# Patient Record
Sex: Female | Born: 1945
Health system: Southern US, Community
[De-identification: ages and names within clinical notes are randomized; demographics above are authoritative.]

## PROBLEM LIST (undated history)

## (undated) DIAGNOSIS — G510 Bell's palsy: Secondary | ICD-10-CM

## (undated) DIAGNOSIS — N3946 Mixed incontinence: Secondary | ICD-10-CM

## (undated) DIAGNOSIS — E049 Nontoxic goiter, unspecified: Secondary | ICD-10-CM

## (undated) DIAGNOSIS — I509 Heart failure, unspecified: Secondary | ICD-10-CM

## (undated) DIAGNOSIS — M109 Gout, unspecified: Secondary | ICD-10-CM

## (undated) DIAGNOSIS — I10 Essential (primary) hypertension: Secondary | ICD-10-CM

## (undated) DIAGNOSIS — G43909 Migraine, unspecified, not intractable, without status migrainosus: Secondary | ICD-10-CM

## (undated) DIAGNOSIS — E041 Nontoxic single thyroid nodule: Secondary | ICD-10-CM

## (undated) DIAGNOSIS — E1142 Type 2 diabetes mellitus with diabetic polyneuropathy: Secondary | ICD-10-CM

## (undated) DIAGNOSIS — M179 Osteoarthritis of knee, unspecified: Secondary | ICD-10-CM

## (undated) DIAGNOSIS — E669 Obesity, unspecified: Secondary | ICD-10-CM

## (undated) DIAGNOSIS — M1990 Unspecified osteoarthritis, unspecified site: Secondary | ICD-10-CM

## (undated) DIAGNOSIS — K222 Esophageal obstruction: Secondary | ICD-10-CM

## (undated) DIAGNOSIS — I5189 Other ill-defined heart diseases: Secondary | ICD-10-CM

## (undated) DIAGNOSIS — D126 Benign neoplasm of colon, unspecified: Secondary | ICD-10-CM

## (undated) DIAGNOSIS — I209 Angina pectoris, unspecified: Secondary | ICD-10-CM

## (undated) DIAGNOSIS — E119 Type 2 diabetes mellitus without complications: Secondary | ICD-10-CM

## (undated) DIAGNOSIS — Z8616 Personal history of COVID-19: Secondary | ICD-10-CM

## (undated) DIAGNOSIS — R1310 Dysphagia, unspecified: Secondary | ICD-10-CM

## (undated) DIAGNOSIS — K219 Gastro-esophageal reflux disease without esophagitis: Secondary | ICD-10-CM

## (undated) DIAGNOSIS — R928 Other abnormal and inconclusive findings on diagnostic imaging of breast: Secondary | ICD-10-CM

## (undated) DIAGNOSIS — E785 Hyperlipidemia, unspecified: Secondary | ICD-10-CM

## (undated) DIAGNOSIS — I2510 Atherosclerotic heart disease of native coronary artery without angina pectoris: Secondary | ICD-10-CM

## (undated) DIAGNOSIS — N8110 Cystocele, unspecified: Secondary | ICD-10-CM

## (undated) DIAGNOSIS — K5792 Diverticulitis of intestine, part unspecified, without perforation or abscess without bleeding: Secondary | ICD-10-CM

## (undated) HISTORY — DX: Benign neoplasm of colon, unspecified: D12.6

## (undated) HISTORY — DX: Migraine, unspecified, not intractable, without status migrainosus: G43.909

## (undated) HISTORY — DX: Heart failure, unspecified: I50.9

## (undated) HISTORY — DX: Diverticulitis of intestine, part unspecified, without perforation or abscess without bleeding: K57.92

## (undated) HISTORY — PX: TUBAL LIGATION: SHX77

## (undated) HISTORY — DX: Dysphagia, unspecified: R13.10

## (undated) HISTORY — DX: Esophageal obstruction: K22.2

## (undated) HISTORY — PX: OTHER SURGICAL HISTORY: SHX169

## (undated) HISTORY — DX: Other ill-defined heart diseases: I51.89

## (undated) HISTORY — DX: Other abnormal and inconclusive findings on diagnostic imaging of breast: R92.8

## (undated) HISTORY — DX: Cystocele, unspecified: N81.10

## (undated) HISTORY — DX: Nontoxic single thyroid nodule: E04.1

## (undated) HISTORY — PX: CARDIAC SURGERY: SHX584

## (undated) HISTORY — DX: Mixed incontinence: N39.46

## (undated) HISTORY — DX: Essential (primary) hypertension: I10

## (undated) HISTORY — DX: Type 2 diabetes mellitus without complications: E11.9

## (undated) HISTORY — DX: Osteoarthritis of knee, unspecified: M17.9

## (undated) HISTORY — DX: Unspecified osteoarthritis, unspecified site: M19.90

## (undated) HISTORY — DX: Bell's palsy: G51.0

## (undated) HISTORY — DX: Type 2 diabetes mellitus with diabetic polyneuropathy: E11.42

## (undated) HISTORY — PX: DIAGNOSTIC LAPAROSCOPY: SUR761

## (undated) HISTORY — DX: Personal history of COVID-19: Z86.16

## (undated) HISTORY — PX: EYE SURGERY: SHX253

## (undated) HISTORY — DX: Gastro-esophageal reflux disease without esophagitis: K21.9

---

## 1998-07-28 ENCOUNTER — Other Ambulatory Visit: Admission: RE | Admit: 1998-07-28 | Discharge: 1998-07-28 | Payer: Self-pay | Admitting: Family Medicine

## 1998-07-29 ENCOUNTER — Other Ambulatory Visit: Admission: RE | Admit: 1998-07-29 | Discharge: 1998-07-29 | Payer: Self-pay | Admitting: Gynecology

## 1998-07-29 ENCOUNTER — Encounter (INDEPENDENT_AMBULATORY_CARE_PROVIDER_SITE_OTHER): Payer: Self-pay

## 1999-04-13 ENCOUNTER — Encounter: Payer: Self-pay | Admitting: Urology

## 1999-04-14 ENCOUNTER — Ambulatory Visit (HOSPITAL_COMMUNITY): Admission: RE | Admit: 1999-04-14 | Discharge: 1999-04-14 | Payer: Self-pay | Admitting: Urology

## 1999-04-14 ENCOUNTER — Encounter (INDEPENDENT_AMBULATORY_CARE_PROVIDER_SITE_OTHER): Payer: Self-pay | Admitting: *Deleted

## 2000-05-23 ENCOUNTER — Other Ambulatory Visit: Admission: RE | Admit: 2000-05-23 | Discharge: 2000-05-23 | Payer: Self-pay | Admitting: Family Medicine

## 2001-03-23 ENCOUNTER — Ambulatory Visit (HOSPITAL_COMMUNITY): Admission: RE | Admit: 2001-03-23 | Discharge: 2001-03-23 | Payer: Self-pay | Admitting: Family Medicine

## 2001-03-23 ENCOUNTER — Encounter: Payer: Self-pay | Admitting: Family Medicine

## 2001-04-04 ENCOUNTER — Ambulatory Visit (HOSPITAL_COMMUNITY): Admission: RE | Admit: 2001-04-04 | Discharge: 2001-04-04 | Payer: Self-pay | Admitting: Family Medicine

## 2001-04-04 ENCOUNTER — Encounter: Payer: Self-pay | Admitting: Family Medicine

## 2001-08-14 ENCOUNTER — Encounter: Admission: RE | Admit: 2001-08-14 | Discharge: 2001-11-12 | Payer: Self-pay | Admitting: Family Medicine

## 2003-06-07 ENCOUNTER — Other Ambulatory Visit: Admission: RE | Admit: 2003-06-07 | Discharge: 2003-06-07 | Payer: Self-pay | Admitting: Family Medicine

## 2003-11-25 ENCOUNTER — Encounter: Admission: RE | Admit: 2003-11-25 | Discharge: 2003-11-25 | Payer: Self-pay | Admitting: Cardiology

## 2003-11-26 ENCOUNTER — Inpatient Hospital Stay (HOSPITAL_BASED_OUTPATIENT_CLINIC_OR_DEPARTMENT_OTHER): Admission: RE | Admit: 2003-11-26 | Discharge: 2003-11-26 | Payer: Self-pay | Admitting: Cardiology

## 2004-07-15 ENCOUNTER — Encounter: Admission: RE | Admit: 2004-07-15 | Discharge: 2004-07-15 | Payer: Self-pay | Admitting: Family Medicine

## 2006-11-30 ENCOUNTER — Other Ambulatory Visit: Admission: RE | Admit: 2006-11-30 | Discharge: 2006-11-30 | Payer: Self-pay | Admitting: Family Medicine

## 2008-05-21 ENCOUNTER — Other Ambulatory Visit: Admission: RE | Admit: 2008-05-21 | Discharge: 2008-05-21 | Payer: Self-pay | Admitting: Family Medicine

## 2010-05-29 NOTE — Cardiovascular Report (Signed)
Shannon Moore, Shannon Moore                ACCOUNT NO.:  [id number]   MEDICAL RECORD NO.:  [id number]          PATIENT TYPE:  OIB   LOCATION:  6501                         FACILITY:  [facility]   PHYSICIAN:  W. Ashley Royalty., M.D.DATE OF BIRTH:  1945/08/20   DATE OF PROCEDURE:  11/26/2003  DATE OF DISCHARGE:                              CARDIAC CATHETERIZATION   BRIEF HISTORY:  This is a 65 year old female with a prior history of  congestive heart failure who presented with increasing dyspnea on exertion  and exercise intolerance.  The study is done to evaluate for coronary artery  disease in view of an abnormal Cardiolite scan.   PERFORMING PHYSICIAN:  Georga Hacking, M.D.   DESCRIPTION OF PROCEDURE:  The patient tolerated the procedure well and  following the procedure had good hemostasis and peripheral pulses noted.   HEMODYNAMIC DATA:  Aorta post contrast - 155/75.  LV post contrast - 155/9  to 14.   ANGIOGRAPHIC DATA:  Left ventriculogram:  Performed in the 30 degree RAO  projection.  The aortic valve was normal.  The mitral valve was normal.  The  left ventricle appears normal in size.  The estimated ejection fraction is  60%. Wall motion is normal.  The coronary arteries arise and distribute  normally.  There is no significant coronary calcification noted.  The left  main coronary artery appears normal.   Left anterior descending coronary artery:  There is a mild 20 to 30%  proximal narrowing noted.  The distal vessel is moderately tortuous.   Circumflex coronary artery:  This is a codominant vessel which is tortuous  containing scattered irregularities, but no significant stenosis is noted.   Right coronary artery:  Dominant vessel with no significant stenoses noted.   IMPRESSION:  1.  Normal left ventricular function.  2.  Very mild coronary artery disease involving the proximal left anterior      descending with otherwise insignificant disease elsewhere.      [name]   WST/MEDQ  D:  11/26/2003  T:  11/26/2003  Job:  [id number]   cc:   Stacie Acres. White, M.D.  [address]  [place]  [place] [address]  Fax: [phone]

## 2010-05-29 NOTE — Op Note (Signed)
. [facility]  Patient:    Shannon Moore, Shannon Moore                         MRN: [id number] Proc. Date: 04/14/99 Adm. Date:  [id number] Attending:  Monica Becton                           Operative Report  PREOPERATIVE DIAGNOSIS:  Superficial papillary bladder tumor.  History of gross  hematuria.  POSTOPERATIVE DIAGNOSIS:  Superficial papillary bladder tumor.  History of gross hematuria.  PROCEDURE:  Cystoscopy, cold cut biopsy of bladder lesion, and transurethral fulguration.  SURGEON:  Claudette Laws, M.D.  DESCRIPTION OF PROCEDURE:  The patient was prepped and draped in the dorsal lithotomy position under intubated general anesthesia.  Cystoscopy was performed revealing a small superficial-appearing papillary lesion just inside the right ureteral orifice up behind the bladder neck area.  The rest of the bladder looked smooth with no other suspicious lesions.  Normal ureteral orifices.  She had some squamous metaplasia of the trigone which we biopsied as biopsy #2.  Initially using the cold cup forceps, we took some biopsies of the papillary lesion. Then we switched to an Olympus continuous flow resectoscope and fulgurated the rest of the bladder tumor with care being taken to preserve the bladder neck area.  The tumor did abut up against the bladder neck at about the 7 oclock position.  All bleeders were electrocoagulated.  5 cc of Urijet was injected intraurethrally.  #18 French Foley catheter was hooked to straight drain and irrigated well.  She was taken back to the recovery room in satisfactory condition. DD:  04/14/99 TD:  04/14/99 Job: [id number]

## 2010-11-11 ENCOUNTER — Other Ambulatory Visit (HOSPITAL_COMMUNITY): Payer: Self-pay | Admitting: Orthopedic Surgery

## 2010-11-11 ENCOUNTER — Ambulatory Visit (HOSPITAL_COMMUNITY)
Admission: RE | Admit: 2010-11-11 | Discharge: 2010-11-11 | Disposition: A | Discharge status: Home / Self Care | Payer: Medicare Other | Source: Ambulatory Visit | Attending: Orthopedic Surgery | Admitting: Orthopedic Surgery

## 2010-11-11 ENCOUNTER — Encounter (HOSPITAL_COMMUNITY): Payer: Self-pay

## 2010-11-11 ENCOUNTER — Encounter (HOSPITAL_COMMUNITY): Payer: Medicare Other

## 2010-11-11 DIAGNOSIS — Z01811 Encounter for preprocedural respiratory examination: Secondary | ICD-10-CM

## 2010-11-11 DIAGNOSIS — Z01812 Encounter for preprocedural laboratory examination: Secondary | ICD-10-CM | POA: Insufficient documentation

## 2010-11-11 DIAGNOSIS — Z01818 Encounter for other preprocedural examination: Secondary | ICD-10-CM | POA: Insufficient documentation

## 2010-11-11 DIAGNOSIS — Z01810 Encounter for preprocedural cardiovascular examination: Secondary | ICD-10-CM | POA: Insufficient documentation

## 2010-11-11 DIAGNOSIS — R9431 Abnormal electrocardiogram [ECG] [EKG]: Secondary | ICD-10-CM | POA: Insufficient documentation

## 2010-11-11 LAB — BASIC METABOLIC PANEL
Calcium: 10.3 mg/dL (ref 8.4–10.5)
GFR calc Af Amer: >90 mL/min (ref >90)
GFR calc non Af Amer: 87 mL/min — ABNORMAL LOW (ref >90)
Glucose, Bld: 127 mg/dL — ABNORMAL HIGH (ref 70–99)
Sodium: 139 mEq/L (ref 135–145)

## 2010-11-11 LAB — CBC
Hemoglobin: 13.4 g/dL (ref 12.0–15.0)
MCH: 29.8 pg (ref 26.0–34.0)
MCHC: 33.5 g/dL (ref 30.0–36.0)
Platelets: 325 10*3/uL (ref 150–400)
RDW: 14 % (ref 11.5–15.5)

## 2010-11-11 LAB — URINALYSIS, ROUTINE W REFLEX MICROSCOPIC
Hgb urine dipstick: NEGATIVE
Protein, ur: NEGATIVE mg/dL
Urobilinogen, UA: 0.2 mg/dL (ref 0.0–1.0)

## 2010-11-11 LAB — DIFFERENTIAL
Basophils Relative: 1 % (ref 0–1)
Eosinophils Absolute: 0.3 10*3/uL (ref 0.0–0.7)
Eosinophils Relative: 3 % (ref 0–5)
Monocytes Absolute: 0.5 10*3/uL (ref 0.1–1.0)
Monocytes Relative: 7 % (ref 3–12)

## 2010-11-11 LAB — SURGICAL PCR SCREEN: Staphylococcus aureus: NEGATIVE

## 2010-11-11 LAB — PROTIME-INR: Prothrombin Time: 13.5 seconds (ref 11.6–15.2)

## 2010-11-11 NOTE — Pre-Procedure Instructions (Signed)
PAT APPT- pre op teaching done, report to chart. Verbalizes understanding. L Deniel Mcquiston RN

## 2010-11-13 MED ORDER — BUPIVACAINE 0.25 % ON-Q PUMP SINGLE CATH 300ML
300.0000 mL | INJECTION | Status: DC
  Filled 2010-11-13: qty 300

## 2010-11-16 ENCOUNTER — Encounter (HOSPITAL_COMMUNITY): Payer: Self-pay | Admitting: Anesthesiology

## 2010-11-16 ENCOUNTER — Inpatient Hospital Stay (HOSPITAL_COMMUNITY): Payer: Medicare Other | Admitting: Anesthesiology

## 2010-11-16 ENCOUNTER — Inpatient Hospital Stay (HOSPITAL_COMMUNITY)
Admission: RE | Admit: 2010-11-16 | Discharge: 2010-11-18 | DRG: 470 | Disposition: A | Discharge status: Home Health | Payer: Medicare Other | Source: Ambulatory Visit | Attending: Orthopedic Surgery | Admitting: Orthopedic Surgery

## 2010-11-16 ENCOUNTER — Encounter (HOSPITAL_COMMUNITY): Payer: Self-pay | Admitting: *Deleted

## 2010-11-16 ENCOUNTER — Encounter (HOSPITAL_COMMUNITY)
Admission: RE | Disposition: A | Discharge status: Home Health | Payer: Self-pay | Source: Ambulatory Visit | Attending: Orthopedic Surgery

## 2010-11-16 DIAGNOSIS — Z01812 Encounter for preprocedural laboratory examination: Secondary | ICD-10-CM

## 2010-11-16 DIAGNOSIS — Z96659 Presence of unspecified artificial knee joint: Secondary | ICD-10-CM

## 2010-11-16 DIAGNOSIS — I10 Essential (primary) hypertension: Secondary | ICD-10-CM | POA: Diagnosis present

## 2010-11-16 DIAGNOSIS — E785 Hyperlipidemia, unspecified: Secondary | ICD-10-CM | POA: Diagnosis present

## 2010-11-16 DIAGNOSIS — Z01810 Encounter for preprocedural cardiovascular examination: Secondary | ICD-10-CM

## 2010-11-16 DIAGNOSIS — E119 Type 2 diabetes mellitus without complications: Secondary | ICD-10-CM | POA: Diagnosis present

## 2010-11-16 DIAGNOSIS — Z880 Allergy status to penicillin: Secondary | ICD-10-CM

## 2010-11-16 DIAGNOSIS — R52 Pain, unspecified: Secondary | ICD-10-CM

## 2010-11-16 DIAGNOSIS — Z79899 Other long term (current) drug therapy: Secondary | ICD-10-CM

## 2010-11-16 DIAGNOSIS — M109 Gout, unspecified: Secondary | ICD-10-CM | POA: Diagnosis present

## 2010-11-16 DIAGNOSIS — M1710 Unilateral primary osteoarthritis, unspecified knee: Principal | ICD-10-CM | POA: Diagnosis present

## 2010-11-16 HISTORY — PX: TOTAL KNEE ARTHROPLASTY: SHX125

## 2010-11-16 LAB — TYPE AND SCREEN: Antibody Screen: NEGATIVE

## 2010-11-16 LAB — GLUCOSE, CAPILLARY
Glucose-Capillary: 155 mg/dL — ABNORMAL HIGH (ref 70–99)
Glucose-Capillary: 165 mg/dL — ABNORMAL HIGH (ref 70–99)
Glucose-Capillary: 183 mg/dL — ABNORMAL HIGH (ref 70–99)
Glucose-Capillary: 253 mg/dL — ABNORMAL HIGH (ref 70–99)

## 2010-11-16 SURGERY — ARTHROPLASTY, KNEE, TOTAL
Anesthesia: Spinal | Site: Knee | Laterality: Left | Wound class: Clean

## 2010-11-16 MED ORDER — POLYETHYLENE GLYCOL 3350 17 G PO PACK
17.0000 g | PACK | Freq: Every day | ORAL | Status: DC
  Administered 2010-11-16: 17 g via ORAL
  Filled 2010-11-16 (×3): qty 1

## 2010-11-16 MED ORDER — SENNOSIDES-DOCUSATE SODIUM 8.6-50 MG PO TABS
1.0000 | ORAL_TABLET | Freq: Every evening | ORAL | Status: DC | PRN
  Administered 2010-11-18 (×2): 1 via ORAL
  Filled 2010-11-16: qty 1

## 2010-11-16 MED ORDER — LACTATED RINGERS IV SOLN
INTRAVENOUS | Status: DC | PRN
  Administered 2010-11-16 (×3): via INTRAVENOUS

## 2010-11-16 MED ORDER — DEXAMETHASONE SODIUM PHOSPHATE 4 MG/ML IJ SOLN
INTRAMUSCULAR | Status: DC | PRN
  Administered 2010-11-16: 10 mg via INTRAVENOUS

## 2010-11-16 MED ORDER — DIPHENHYDRAMINE HCL 25 MG PO CAPS: 25.0000 mg | ORAL_CAPSULE | Freq: Four times a day (QID) | ORAL | Status: DC | PRN

## 2010-11-16 MED ORDER — NON FORMULARY: 80.0000 mg | Freq: Every day | Status: DC

## 2010-11-16 MED ORDER — ALUMINUM HYDROXIDE GEL 600 MG/5ML PO SUSP: 15.0000 mL | ORAL | Status: DC | PRN

## 2010-11-16 MED ORDER — INSULIN ASPART 100 UNIT/ML ~~LOC~~ SOLN
0.0000 [IU] | Freq: Three times a day (TID) | SUBCUTANEOUS | Status: DC
  Filled 2010-11-16: qty 3

## 2010-11-16 MED ORDER — KETOROLAC TROMETHAMINE 30 MG/ML IJ SOLN
INTRAMUSCULAR | Status: AC
  Filled 2010-11-16: qty 1

## 2010-11-16 MED ORDER — FLUTICASONE PROPIONATE 50 MCG/ACT NA SUSP
2.0000 | Freq: Two times a day (BID) | NASAL | Status: DC
  Administered 2010-11-17 (×2): 2 via NASAL
  Filled 2010-11-16: qty 16

## 2010-11-16 MED ORDER — DOCUSATE SODIUM 100 MG PO CAPS
100.0000 mg | ORAL_CAPSULE | Freq: Two times a day (BID) | ORAL | Status: DC
  Administered 2010-11-16: 100 mg via ORAL
  Filled 2010-11-16 (×5): qty 1

## 2010-11-16 MED ORDER — HYDROCHLOROTHIAZIDE 12.5 MG PO CAPS
12.5000 mg | ORAL_CAPSULE | Freq: Every day | ORAL | Status: DC
  Administered 2010-11-16 – 2010-11-17 (×2): 12.5 mg via ORAL
  Filled 2010-11-16 (×3): qty 1

## 2010-11-16 MED ORDER — SIMVASTATIN 40 MG PO TABS
40.0000 mg | ORAL_TABLET | Freq: Every day | ORAL | Status: DC
  Administered 2010-11-16 – 2010-11-17 (×2): 40 mg via ORAL
  Filled 2010-11-16 (×3): qty 1

## 2010-11-16 MED ORDER — PROPOFOL 10 MG/ML IV EMUL
INTRAVENOUS | Status: DC | PRN
  Administered 2010-11-16: 160 ug/kg/min via INTRAVENOUS

## 2010-11-16 MED ORDER — PROMETHAZINE HCL 25 MG/ML IJ SOLN: 6.2500 mg | INTRAMUSCULAR | Status: DC | PRN

## 2010-11-16 MED ORDER — INSULIN ASPART 100 UNIT/ML ~~LOC~~ SOLN
0.0000 [IU] | Freq: Three times a day (TID) | SUBCUTANEOUS | Status: DC
  Administered 2010-11-16: 3 [IU] via SUBCUTANEOUS
  Administered 2010-11-17: 2 [IU] via SUBCUTANEOUS
  Administered 2010-11-17: 3 [IU] via SUBCUTANEOUS
  Filled 2010-11-16: qty 3

## 2010-11-16 MED ORDER — FENTANYL CITRATE 0.05 MG/ML IJ SOLN
INTRAMUSCULAR | Status: DC | PRN
  Administered 2010-11-16 (×2): 50 ug via INTRAVENOUS

## 2010-11-16 MED ORDER — METFORMIN HCL ER 500 MG PO TB24
500.0000 mg | ORAL_TABLET | Freq: Every day | ORAL | Status: DC
Start: 2010-11-16 — End: 2010-11-18
  Administered 2010-11-17: 500 mg via ORAL
  Filled 2010-11-16 (×3): qty 1

## 2010-11-16 MED ORDER — PHENOL 1.4 % MT LIQD
1.0000 | OROMUCOSAL | Status: DC | PRN
  Filled 2010-11-16: qty 177

## 2010-11-16 MED ORDER — FERROUS SULFATE 325 (65 FE) MG PO TABS
325.0000 mg | ORAL_TABLET | Freq: Three times a day (TID) | ORAL | Status: DC
  Administered 2010-11-16 – 2010-11-18 (×5): 325 mg via ORAL
  Filled 2010-11-16 (×8): qty 1

## 2010-11-16 MED ORDER — HYDROCHLOROTHIAZIDE 25 MG PO TABS
12.5000 mg | ORAL_TABLET | Freq: Every day | ORAL | Status: DC
  Filled 2010-11-16: qty 0.5

## 2010-11-16 MED ORDER — METHOCARBAMOL 100 MG/ML IJ SOLN
500.0000 mg | Freq: Four times a day (QID) | INTRAMUSCULAR | Status: DC | PRN
  Administered 2010-11-16: 500 mg via INTRAVENOUS
  Filled 2010-11-16 (×2): qty 5

## 2010-11-16 MED ORDER — LORATADINE 10 MG PO TABS
10.0000 mg | ORAL_TABLET | Freq: Every day | ORAL | Status: DC
  Administered 2010-11-17: 10 mg via ORAL
  Filled 2010-11-16 (×2): qty 1

## 2010-11-16 MED ORDER — METOCLOPRAMIDE HCL 10 MG PO TABS: 5.0000 mg | ORAL_TABLET | Freq: Three times a day (TID) | ORAL | Status: DC | PRN

## 2010-11-16 MED ORDER — POTASSIUM CHLORIDE IN NACL 20-0.9 MEQ/L-% IV SOLN
100.0000 mL/h | INTRAVENOUS | Status: DC
  Administered 2010-11-16 – 2010-11-17 (×2): 100 mL/h via INTRAVENOUS
  Filled 2010-11-16 (×5): qty 1000

## 2010-11-16 MED ORDER — KETOROLAC TROMETHAMINE 30 MG/ML IJ SOLN
INTRAMUSCULAR | Status: DC | PRN
  Administered 2010-11-16: 30 mg via INTRAVENOUS

## 2010-11-16 MED ORDER — BUPIVACAINE IN DEXTROSE 0.75-8.25 % IT SOLN
INTRATHECAL | Status: DC | PRN
  Administered 2010-11-16: 1.2 mL via INTRATHECAL

## 2010-11-16 MED ORDER — OLMESARTAN MEDOXOMIL 20 MG PO TABS
20.0000 mg | ORAL_TABLET | Freq: Every day | ORAL | Status: DC
  Administered 2010-11-16 – 2010-11-17 (×2): 20 mg via ORAL
  Filled 2010-11-16 (×3): qty 1

## 2010-11-16 MED ORDER — MIDAZOLAM HCL 5 MG/5ML IJ SOLN
INTRAMUSCULAR | Status: DC | PRN
  Administered 2010-11-16: 2 mg via INTRAVENOUS

## 2010-11-16 MED ORDER — ALUM & MAG HYDROXIDE-SIMETH 200-200-20 MG/5ML PO SUSP: 30.0000 mL | ORAL | Status: DC | PRN

## 2010-11-16 MED ORDER — ALLOPURINOL 300 MG PO TABS
300.0000 mg | ORAL_TABLET | Freq: Every day | ORAL | Status: DC
Start: 2010-11-16 — End: 2010-11-18
  Administered 2010-11-16 – 2010-11-17 (×2): 300 mg via ORAL
  Filled 2010-11-16 (×3): qty 1

## 2010-11-16 MED ORDER — CLINDAMYCIN PHOSPHATE 600 MG/50ML IV SOLN
600.0000 mg | INTRAVENOUS | Status: AC
  Administered 2010-11-16: 600 mg via INTRAVENOUS
  Filled 2010-11-16: qty 50

## 2010-11-16 MED ORDER — ZOLPIDEM TARTRATE 5 MG PO TABS: 5.0000 mg | ORAL_TABLET | Freq: Every evening | ORAL | Status: DC | PRN

## 2010-11-16 MED ORDER — ACETAMINOPHEN 325 MG PO TABS
650.0000 mg | ORAL_TABLET | Freq: Four times a day (QID) | ORAL | Status: DC | PRN
  Administered 2010-11-16: 650 mg via ORAL

## 2010-11-16 MED ORDER — HYDROMORPHONE HCL PF 1 MG/ML IJ SOLN: 0.2500 mg | INTRAMUSCULAR | Status: DC | PRN

## 2010-11-16 MED ORDER — HYDROMORPHONE HCL PF 1 MG/ML IJ SOLN
0.5000 mg | INTRAMUSCULAR | Status: DC | PRN
  Administered 2010-11-16 – 2010-11-17 (×3): 1 mg via INTRAVENOUS
  Filled 2010-11-16 (×3): qty 1

## 2010-11-16 MED ORDER — SENNA 8.6 MG PO TABS
1.0000 | ORAL_TABLET | Freq: Two times a day (BID) | ORAL | Status: DC
  Administered 2010-11-16 – 2010-11-18 (×3): 8.6 mg via ORAL
  Filled 2010-11-16 (×5): qty 1

## 2010-11-16 MED ORDER — ACETAMINOPHEN 325 MG PO TABS
ORAL_TABLET | ORAL | Status: AC
  Administered 2010-11-16: 650 mg via ORAL
  Filled 2010-11-16: qty 2

## 2010-11-16 MED ORDER — FLEET ENEMA 7-19 GM/118ML RE ENEM: 1.0000 | ENEMA | Freq: Every day | RECTAL | Status: DC | PRN

## 2010-11-16 MED ORDER — MENTHOL 3 MG MT LOZG
1.0000 | LOZENGE | OROMUCOSAL | Status: DC | PRN
  Filled 2010-11-16: qty 9

## 2010-11-16 MED ORDER — SODIUM CHLORIDE 0.9 % IV SOLN
1190.0000 mg | Freq: Once | INTRAVENOUS | Status: DC
  Filled 2010-11-16: qty 11.9

## 2010-11-16 MED ORDER — RIVAROXABAN 10 MG PO TABS
10.0000 mg | ORAL_TABLET | ORAL | Status: DC
  Administered 2010-11-17: 10 mg via ORAL
  Filled 2010-11-16 (×2): qty 1

## 2010-11-16 MED ORDER — ONDANSETRON HCL 4 MG/2ML IJ SOLN
4.0000 mg | Freq: Four times a day (QID) | INTRAMUSCULAR | Status: DC | PRN
  Administered 2010-11-18: 4 mg via INTRAVENOUS
  Filled 2010-11-16: qty 2

## 2010-11-16 MED ORDER — BISACODYL 5 MG PO TBEC: 10.0000 mg | DELAYED_RELEASE_TABLET | Freq: Every day | ORAL | Status: DC | PRN

## 2010-11-16 MED ORDER — TRANEXAMIC ACID 100 MG/ML IV SOLN
1190.0000 mg | INTRAVENOUS | Status: DC | PRN
  Administered 2010-11-16: 1190 mg via INTRAVENOUS

## 2010-11-16 MED ORDER — METHOCARBAMOL 500 MG PO TABS
500.0000 mg | ORAL_TABLET | Freq: Four times a day (QID) | ORAL | Status: DC | PRN
  Administered 2010-11-17 – 2010-11-18 (×4): 500 mg via ORAL
  Filled 2010-11-16 (×4): qty 1

## 2010-11-16 MED ORDER — PROPOFOL 10 MG/ML IV EMUL
INTRAVENOUS | Status: DC | PRN
  Administered 2010-11-16: 20 mg via INTRAVENOUS

## 2010-11-16 MED ORDER — CLINDAMYCIN PHOSPHATE 600 MG/50ML IV SOLN
600.0000 mg | Freq: Four times a day (QID) | INTRAVENOUS | Status: AC
  Administered 2010-11-16 – 2010-11-17 (×3): 600 mg via INTRAVENOUS
  Filled 2010-11-16 (×3): qty 50

## 2010-11-16 MED ORDER — BISACODYL 10 MG RE SUPP: 10.0000 mg | Freq: Every day | RECTAL | Status: DC | PRN

## 2010-11-16 MED ORDER — METOCLOPRAMIDE HCL 5 MG/ML IJ SOLN: 5.0000 mg | Freq: Three times a day (TID) | INTRAMUSCULAR | Status: DC | PRN

## 2010-11-16 MED ORDER — HYDROCODONE-ACETAMINOPHEN 7.5-325 MG PO TABS
1.0000 | ORAL_TABLET | ORAL | Status: DC | PRN
  Administered 2010-11-16 – 2010-11-18 (×7): 2 via ORAL
  Filled 2010-11-16 (×7): qty 2

## 2010-11-16 MED ORDER — BUPIVACAINE-EPINEPHRINE PF 0.25-1:200000 % IJ SOLN
INTRAMUSCULAR | Status: AC
  Filled 2010-11-16: qty 60

## 2010-11-16 MED ORDER — BUPIVACAINE-EPINEPHRINE PF 0.25-1:200000 % IJ SOLN
INTRAMUSCULAR | Status: DC | PRN
  Administered 2010-11-16: 60 mL

## 2010-11-16 MED ORDER — ONDANSETRON HCL 4 MG PO TABS: 4.0000 mg | ORAL_TABLET | Freq: Four times a day (QID) | ORAL | Status: DC | PRN

## 2010-11-16 MED ORDER — ACETAMINOPHEN 650 MG RE SUPP: 650.0000 mg | Freq: Four times a day (QID) | RECTAL | Status: DC | PRN

## 2010-11-16 SURGICAL SUPPLY — 64 items
ADH SKN CLS APL DERMABOND .7 (GAUZE/BANDAGES/DRESSINGS) ×1
BAG SPEC THK2 15X12 ZIP CLS (MISCELLANEOUS) ×1
BAG ZIPLOCK 12X15 (MISCELLANEOUS) ×2 IMPLANT
BANDAGE ELASTIC 6 VELCRO ST LF (GAUZE/BANDAGES/DRESSINGS) ×2 IMPLANT
BANDAGE ESMARK 6X9 LF (GAUZE/BANDAGES/DRESSINGS) ×1 IMPLANT
BLADE SAW SGTL 13.0X1.19X90.0M (BLADE) ×2 IMPLANT
BNDG CMPR 9X6 STRL LF SNTH (GAUZE/BANDAGES/DRESSINGS) ×1
BNDG ESMARK 6X9 LF (GAUZE/BANDAGES/DRESSINGS) ×2
BOWL SMART MIX CTS (DISPOSABLE) ×2 IMPLANT
CEMENT BONE DEPUY (Cement) ×2 IMPLANT
CEMENT TIBIA MBT SIZE 2 (Knees) IMPLANT
CLOTH BEACON ORANGE TIMEOUT ST (SAFETY) ×2 IMPLANT
COMP FEM CEM PS LT SZ 2 (Knees) ×2 IMPLANT
COMPONENT FEM CEM PS LT SZ 2 (Knees) IMPLANT
CUFF TOURN SGL QUICK 34 (TOURNIQUET CUFF) ×2
CUFF TRNQT CYL 34X4X40X1 (TOURNIQUET CUFF) ×1 IMPLANT
DECANTER SPIKE VIAL GLASS SM (MISCELLANEOUS) ×2 IMPLANT
DERMABOND ADVANCED (GAUZE/BANDAGES/DRESSINGS) ×1
DERMABOND ADVANCED .7 DNX12 (GAUZE/BANDAGES/DRESSINGS) ×1 IMPLANT
DRAPE EXTREMITY T 121X128X90 (DRAPE) ×2 IMPLANT
DRAPE POUCH INSTRU U-SHP 10X18 (DRAPES) ×2 IMPLANT
DRAPE U-SHAPE 47X51 STRL (DRAPES) ×2 IMPLANT
DRSG AQUACEL AG ADV 3.5X10 (GAUZE/BANDAGES/DRESSINGS) ×2 IMPLANT
DRSG TEGADERM 4X4.75 (GAUZE/BANDAGES/DRESSINGS) ×2 IMPLANT
DURAPREP 26ML APPLICATOR (WOUND CARE) ×2 IMPLANT
ELECT REM PT RETURN 9FT ADLT (ELECTROSURGICAL) ×2
ELECTRODE REM PT RTRN 9FT ADLT (ELECTROSURGICAL) ×1 IMPLANT
EVACUATOR 1/8 PVC DRAIN (DRAIN) ×2 IMPLANT
FACESHIELD LNG OPTICON STERILE (SAFETY) ×10 IMPLANT
GAUZE SPONGE 2X2 8PLY STRL LF (GAUZE/BANDAGES/DRESSINGS) ×1 IMPLANT
GLOVE BIOGEL PI IND STRL 7.5 (GLOVE) ×1 IMPLANT
GLOVE BIOGEL PI IND STRL 8 (GLOVE) ×1 IMPLANT
GLOVE BIOGEL PI IND STRL 8.5 (GLOVE) IMPLANT
GLOVE BIOGEL PI INDICATOR 7.5 (GLOVE) ×1
GLOVE BIOGEL PI INDICATOR 8 (GLOVE) ×1
GLOVE BIOGEL PI INDICATOR 8.5 (GLOVE)
GLOVE ECLIPSE 8.0 STRL XLNG CF (GLOVE) ×2 IMPLANT
GLOVE ORTHO TXT STRL SZ7.5 (GLOVE) ×4 IMPLANT
GOWN STRL NON-REIN LRG LVL3 (GOWN DISPOSABLE) ×2 IMPLANT
HANDPIECE INTERPULSE COAX TIP (DISPOSABLE) ×2
IMMOBILIZER KNEE 20 (SOFTGOODS)
IMMOBILIZER KNEE 20 THIGH 36 (SOFTGOODS) IMPLANT
INSERT PFC SIG STB SZ 2 12.5MM (Knees) ×1 IMPLANT
KIT BASIN OR (CUSTOM PROCEDURE TRAY) ×2 IMPLANT
MANIFOLD NEPTUNE II (INSTRUMENTS) ×2 IMPLANT
NDL SAFETY ECLIPSE 18X1.5 (NEEDLE) ×1 IMPLANT
NEEDLE HYPO 18GX1.5 SHARP (NEEDLE) ×2
NS IRRIG 1000ML POUR BTL (IV SOLUTION) ×4 IMPLANT
PACK TOTAL JOINT (CUSTOM PROCEDURE TRAY) ×2 IMPLANT
PATELLA DOME PFC 35MM (Knees) ×1 IMPLANT
POSITIONER SURGICAL ARM (MISCELLANEOUS) ×2 IMPLANT
SET HNDPC FAN SPRY TIP SCT (DISPOSABLE) ×1 IMPLANT
SET PAD KNEE POSITIONER (MISCELLANEOUS) ×2 IMPLANT
SPONGE GAUZE 2X2 STER 10/PKG (GAUZE/BANDAGES/DRESSINGS) ×1
SUCTION FRAZIER 12FR DISP (SUCTIONS) ×2 IMPLANT
SUT MNCRL AB 4-0 PS2 18 (SUTURE) ×2 IMPLANT
SUT VIC AB 1 CT1 36 (SUTURE) ×6 IMPLANT
SUT VIC AB 2-0 CT1 27 (SUTURE) ×6
SUT VIC AB 2-0 CT1 TAPERPNT 27 (SUTURE) ×3 IMPLANT
SYR 50ML LL SCALE MARK (SYRINGE) ×2 IMPLANT
TIBIA MBT CEMENT SIZE 2 (Knees) ×2 IMPLANT
TOWEL OR 17X26 10 PK STRL BLUE (TOWEL DISPOSABLE) ×4 IMPLANT
TRAY FOLEY CATH 14FRSI W/METER (CATHETERS) ×2 IMPLANT
WATER STERILE IRR 1500ML POUR (IV SOLUTION) ×2 IMPLANT

## 2010-11-16 NOTE — Transfer of Care (Signed)
Immediate Anesthesia Transfer of Care Note  Patient: Shannon Moore  Procedure(s) Performed:  TOTAL KNEE ARTHROPLASTY  Patient Location: PACU  Anesthesia Type: Spinal  Level of Consciousness: awake and alert   Airway & Oxygen Therapy: Patient Spontanous Breathing and Patient connected to face mask oxygen  Post-op Assessment: Report given to PACU RN and Post -op Vital signs reviewed and stable  Post vital signs: Reviewed and stable  Complications: No apparent anesthesia complications

## 2010-11-16 NOTE — Preoperative (Addendum)
Beta Blockers   Reason not to administer Beta Blockers:Not Applicable 

## 2010-11-16 NOTE — Anesthesia Preprocedure Evaluation (Addendum)
Anesthesia Evaluation  Patient identified by MRN, date of birth, ID band Patient awake    Reviewed: Allergy & Precautions, H&P , NPO status , Patient's Chart, lab work & pertinent test results, reviewed documented beta blocker date and time   Airway Mallampati: II TM Distance: >3 FB Neck ROM: Full    Dental  (+) Dental Advisory Given   Pulmonary neg pulmonary ROS,  clear to auscultation  Pulmonary exam normal       Cardiovascular hypertension, +CHF Regular Normal    Neuro/Psych Negative Neurological ROS  Negative Psych ROS   GI/Hepatic negative GI ROS, Neg liver ROS,   Endo/Other  Diabetes mellitus-, Well Controlled, Type 2GOUT   Renal/GU negative Renal ROS  Genitourinary negative   Musculoskeletal negative musculoskeletal ROS (+)   Abdominal   Peds negative pediatric ROS (+)  Hematology negative hematology ROS (+)   Anesthesia Other Findings   Reproductive/Obstetrics negative OB ROS                           Anesthesia Physical Anesthesia Plan  ASA: III  Anesthesia Plan: Spinal   Post-op Pain Management:    Induction:   Airway Management Planned: Mask  Additional Equipment:   Intra-op Plan:   Post-operative Plan: Extubation in OR  Informed Consent: I have reviewed the patients History and Physical, chart, labs and discussed the procedure including the risks, benefits and alternatives for the proposed anesthesia with the patient or authorized representative who has indicated his/her understanding and acceptance.   Dental advisory given  Plan Discussed with: CRNA and Surgeon  Anesthesia Plan Comments:         Anesthesia Quick Evaluation

## 2010-11-16 NOTE — Anesthesia Postprocedure Evaluation (Signed)
  Anesthesia Post-op Note  Patient: Shannon Moore  Procedure(s) Performed:  TOTAL KNEE ARTHROPLASTY  Patient Location: PACU  Anesthesia Type: Spinal  Level of Consciousness: alert  and oriented  Airway and Oxygen Therapy: Patient Spontanous Breathing and Patient connected to nasal cannula oxygen  Post-op Pain: none  Post-op Assessment: Post-op Vital signs reviewed, Patient's Cardiovascular Status Stable, Respiratory Function Stable, Patent Airway and Pain level controlled  Post-op Vital Signs: stable  Complications: No apparent anesthesia complications

## 2010-11-16 NOTE — Progress Notes (Signed)
Pt arrived to unit from PACU on bed, A&Ox3. Assessment as charted. Pt and family oriented to callbell and environment. POC discussed. Will medicate for pain and H/A  per admission orders.

## 2010-11-16 NOTE — Progress Notes (Addendum)
NAME:  Shannon Moore, Shannon Moore                     ACCOUNT NO.:      MEDICAL RECORD NO.:      LOCATION:                                 FACILITY:      PHYSICIAN:  Madlyn Frankel. Charlann Boxer, M.D.  DATE OF BIRTH:  [birth year]      DATE OF ADMISSION:   DATE OF DISCHARGE:                                 HISTORY & PHYSICAL         DATE OF SURGERY:  November 16, 2010.      ADMITTING DIAGNOSIS:  Osteoarthritis, left knee.      HISTORY OF PRESENT ILLNESS:  This is a 65 year old lady with a history   of osteoarthritis of the left knee with failure of conservative   treatment to alleviate her symptoms.  After discussion of treatment,   benefits, risks, and options, the patient is now scheduled for total   knee arthroplasty of the left knee.  Her medical doctor is Dr. Laurann Montana.  She will be going home after surgery.  She is a candidate for   tranexamic acid and will receive that at surgery.  She is given her home   medicines of iron, Robaxin, aspirin, MiraLax, and Colace.      PAST MEDICAL HISTORY:  Drug allergies to PENICILLIN with a rash.  SULFA   with itching.  ZOLOFT with heart failure.      CURRENT MEDICATIONS:   1. Metformin 500 mg two every dinner.   2. Pravastatin 80 mg 1 daily.   3. Benicar/hydrochlorothiazide 1 daily.   4. Allopurinol 300 mg 1 daily.   5. Zyrtec 10 mg 1 daily.      PREVIOUS SURGERIES:  Include a bladder tumor, tubal ligation, right knee   scope, and left knee scoped.      SERIOUS MEDICAL ILLNESSES:  Include hypertension, diabetes,   hyperlipidemia, gout, and allergies.      FAMILY HISTORY:  Positive for diabetes and arthritis.      SOCIAL HISTORY:  The patient is married.  She does not smoke and drinks   1 glass of wine per day.      REVIEW OF SYSTEMS:  CENTRAL NERVOUS SYSTEM:  Negative for headache,   blurred vision, or dizziness.  PULMONARY:  Negative for shortness of   breath, except exertional shortness of breath.  CARDIOVASCULAR:   Positive for history of  heart failure in 1998.  Cardiac cath since then   x2 without any pertinent findings.  No chest pain or regular heart beat.   GI:  Negative for ulcers, hepatitis.  GU:  Negative for urinary tract   difficulty.  MUSCULOSKELETAL:  Positive in HPI.      PHYSICAL EXAMINATION:  VITAL SIGNS:  BP 134/72, respirations 16, pulse   68 and regular.  GENERAL APPEARANCE:  This is a well-developed, well-   nourished lady, in no acute distress.   HEENT:  Head, normocephalic.  Nose patent.  Ears patent.  Pupils equal,   round, and reactive to light.  Throat without injection.   NECK:  Supple without adenopathy.  Carotids 2+ without  bruit.  CHEST:   Clear to auscultation.  No rales, rhonchi.  Respirations 16.  HEART:   Regular rate and rhythm at 68 beats per minute without murmur.   ABDOMEN:  Soft.  Active bowel sounds.  No masses, organomegaly.   NEUROLOGIC:  The patient is alert and orient to time, place, and person.   Cranial nerves 2-12 grossly intact.   EXTREMITIES:  Shows left knee with 5 degrees flexion contracture,   further flexion to 120.  Neurovascular status intact.      IMPRESSION:  Osteoarthritis, left knee.      PLAN:  Total knee arthroplasty, left knee.   Gerrit Halls   [facility]  Patient was seen and re-examined in the holding area.  There is no change in the patient's History and Physical.  Madlyn Frankel. Charlann Boxer, MD  11/16/2010,11:59 AM

## 2010-11-16 NOTE — Progress Notes (Signed)
Utilization review completed.  

## 2010-11-16 NOTE — Anesthesia Procedure Notes (Signed)
Spinal Block  Patient location during procedure: OR Start time: 11/16/2010 12:05 PM End time: 11/16/2010 12:11 PM Staffing Anesthesiologist: Lestine Box B Performed by: anesthesiologist  Preanesthetic Checklist Completed: patient identified, site marked, surgical consent, pre-op evaluation, timeout performed, IV checked, risks and benefits discussed and monitors and equipment checked Spinal Block Patient position: sitting Prep: Betadine Patient monitoring: heart rate, cardiac monitor, continuous pulse ox and blood pressure Approach: right paramedian Location: L3-4 Injection technique: single-shot Needle Needle type: Quincke  Needle gauge: 25 G Needle length: 9 cm Assessment Sensory level: T6 Additional Notes Kit [id number] , 2013-11

## 2010-11-16 NOTE — Transfer of Care (Signed)
Immediate Anesthesia Transfer of Care Note  Patient: Shannon Moore  Procedure(s) Performed:  TOTAL KNEE ARTHROPLASTY  Patient Location: PACU  Anesthesia Type: Spinal  Level of Consciousness: alert   Airway & Oxygen Therapy: Patient Spontanous Breathing  Post-op Assessment: Report given to PACU RN  Post vital signs: Reviewed and stable  Complications: No apparent anesthesia complications

## 2010-11-17 LAB — CBC
Hemoglobin: 11.2 g/dL — ABNORMAL LOW (ref 12.0–15.0)
MCV: 88.7 fL (ref 78.0–100.0)
Platelets: 282 10*3/uL (ref 150–400)
RBC: 3.73 MIL/uL — ABNORMAL LOW (ref 3.87–5.11)
WBC: 11.2 10*3/uL — ABNORMAL HIGH (ref 4.0–10.5)

## 2010-11-17 LAB — BASIC METABOLIC PANEL
CO2: 28 mEq/L (ref 19–32)
Chloride: 98 mEq/L (ref 96–112)
Glucose, Bld: 200 mg/dL — ABNORMAL HIGH (ref 70–99)
Sodium: 136 mEq/L (ref 135–145)

## 2010-11-17 LAB — GLUCOSE, CAPILLARY

## 2010-11-17 MED ORDER — COLCHICINE 0.6 MG PO TABS
0.6000 mg | ORAL_TABLET | Freq: Every day | ORAL | Status: DC
  Filled 2010-11-17 (×2): qty 1

## 2010-11-17 MED ORDER — COLCHICINE 0.6 MG PO TABS
0.6000 mg | ORAL_TABLET | ORAL | Status: AC
  Administered 2010-11-17 (×3): 0.6 mg via ORAL
  Filled 2010-11-17 (×3): qty 1

## 2010-11-17 MED ORDER — COLCHICINE 0.6 MG PO TABS
0.6000 mg | ORAL_TABLET | Freq: Every day | ORAL | Status: DC
  Filled 2010-11-17: qty 1

## 2010-11-17 MED ORDER — COLCHICINE 0.6 MG PO TABS
0.6000 mg | ORAL_TABLET | ORAL | Status: DC
  Filled 2010-11-17 (×3): qty 1

## 2010-11-17 MED ORDER — HYDROMORPHONE HCL PF 2 MG/ML IJ SOLN
0.5000 mg | INTRAMUSCULAR | Status: DC | PRN
  Administered 2010-11-17: 1 mg via INTRAVENOUS
  Administered 2010-11-18: 2 mg via INTRAVENOUS
  Filled 2010-11-17 (×2): qty 1

## 2010-11-17 MED ORDER — SODIUM CHLORIDE 0.9 % IJ SOLN
3.0000 mL | Freq: Two times a day (BID) | INTRAMUSCULAR | Status: DC
  Administered 2010-11-17: 3 mL via INTRAVENOUS

## 2010-11-17 NOTE — Progress Notes (Signed)
  Subjective: 1 Day Post-Op Procedure(s) (LRB): TOTAL KNEE ARTHROPLASTY (Left)  Patient reports pain as mild.  Objective:   VITALS:   Filed Vitals:   11/17/10 0619  BP: 110/60  Pulse: 65  Temp: 97.9 F (36.6 C)  Resp: 16    Neurovascular intact Sensation intact distally Intact pulses distally Dorsiflexion/Plantar flexion intact Incision: dressing C/D/I No cellulitis present Compartment soft  LABS  Basename 11/17/10 0356  HGB 11.2*  HCT 33.1*  WBC 11.2*  PLT 282     Basename 11/17/10 0356  NA 136  K 4.0  BUN 23  CREATININE 0.76  GLUCOSE 200*    No results found for this basename: LABPT:2,INR:2 in the last 72 hours   Assessment/Plan: 1 Day Post-Op Procedure(s) (LRB): TOTAL KNEE ARTHROPLASTY (Left)   Advance diet Up with therapy D/C IV fluids Plan for discharge tomorrow Discharge home with home health   Anastasio Auerbach. Mackena Plummer   PAC  11/17/2010, 9:43 AM

## 2010-11-17 NOTE — Progress Notes (Deleted)
Pt requests to go to [employer] in own personal vehicle, driven by husband. Pt d/c in w/c in possession of transfer packet and all personal belongings. Pt d/c in w/c by NT in stable condition. No c/o.

## 2010-11-17 NOTE — Progress Notes (Signed)
Physical Therapy Treatment Patient Details Name: Shannon Moore MRN: [id number] DOB: Dec 28, 1945 Today's Date: 11/17/2010 [id number] 1Gt, 1Ta  PT Assessment/Plan  PT - Assessment/Plan Comments on Treatment Session: Progressing although near time for more pain meds.  Will practice further TKA exercises and up one step in AM prior to potential d/c. PT Plan: Discharge plan remains appropriate PT Frequency: 7X/week Follow Up Recommendations: Home health PT Equipment Recommended: None recommended by PT PT Goals  Acute Rehab PT Goals PT Goal Formulation: With patient Time For Goal Achievement: 7 days Pt will go Sit to Supine/Side: with modified independence PT Goal: Sit to Supine/Side - Progress: Progressing toward goal Pt will Stand: with modified independence;with no upper extremity support;1 - 2 min PT Goal: Stand - Progress: Progressing toward goal Pt will Ambulate: >150 feet;with modified independence;with rolling walker PT Goal: Ambulate - Progress: Progressing toward goal  PT Treatment Precautions/Restrictions  Precautions Precautions: Knee Precaution Booklet Issued: No Required Braces or Orthoses: No Knee Immobilizer: Other (comment) (discontinued by PT) Restrictions Weight Bearing Restrictions: Yes LLE Weight Bearing: Weight bearing as tolerated Mobility (including Balance) Bed Mobility Bed Mobility: Yes Sit to supine  Sit to supine with min assist for left LE Transfers Transfers: Yes Sit to Stand: 4: Min assist;From chair/3-in-1;From toilet Sit to Stand Details (indicate cue type and reason): cue for hand placement Stand to Sit: To toilet;To bed;4: Min assist Stand to Sit Details: cues for left leg out to sit on toilet Ambulation/Gait Ambulation/Gait: Yes Ambulation/Gait Assistance: 4: Min assist Ambulation/Gait Assistance Details (indicate cue type and reason): cue for proximity to walker Ambulation Distance (Feet): 140 Feet Assistive device: Rolling walker Gait  Pattern: Antalgic;Step-through pattern  Posture/Postural Control Posture/Postural Control: No significant limitations End of Session PT - End of Session Equipment Utilized During Treatment: Gait belt Activity Tolerance: Patient tolerated treatment well Patient left: in bed Nurse Communication: Mobility status for ambulation General Behavior During Session: [facility] for tasks performed Cognition: [facility] for tasks performed  [facility] 11/17/2010, 4:04 PM

## 2010-11-17 NOTE — Progress Notes (Signed)
Occupational Therapy Evaluation Patient Details Name: Shannon Moore MRN: [id number] DOB: [birth year] Today's Date: 11/17/2010  Problem List: There is no problem list on file for this patient.   Past Medical History:  Past Medical History  Diagnosis Date  . CHF (congestive heart failure)     1998 following zoloft administration/ neg cath 2005 and recent EF per MD note- Dr Cliffton Asters dated 09/28/10- clearence from Dr Cliffton Asters on chart  . Diabetes mellitus     oral med control  . 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 Chronic kidney disease     benign bladder tumor 10 yrs ago  . GERD (gastroesophageal reflux disease)   . Headache     after menopause resolved  . Hypertension     hyperlipidemia  . Arthritis     DDD/GOUT   Past Surgical History:  Past Surgical History  Procedure Date  . Tubal ligation   . Diagnostic laparoscopy   . Eye surgery     bilateral  RK 10 yrs ago  . Left and right knee arthroscopies- left 6 months ago     OT Assessment/Plan/Recommendation OT Assessment OT Recommendation/Assessment: Patient does not need any further OT services OT Recommendation Equipment Recommended: 3 in 1 bedside comode OT Goals    OT Evaluation Precautions/Restrictions  Precautions Precautions: Knee Precaution Booklet Issued: No Required Braces or Orthoses: No Knee Immobilizer: Other (comment) (discontinued by PT) Restrictions Weight Bearing Restrictions: No LLE Weight Bearing: Weight bearing as tolerated Prior Functioning Home Living Lives With: Spouse;Son [place] Help From: Family Type of Home: House Home Layout: One level Home Access: Stairs to enter Secretary/administrator of Steps: 1 Bathroom Shower/Tub: Health visitor: Standard Home Adaptive Equipment: Shower chair without back;Walker - rolling Prior Function Level of Independence: Independent with basic ADLs;Independent with gait Vocation: Full time  employment Comments: 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 ADL ADL Grooming: Performed;Supervision/safety Where Assessed - Grooming: Standing at sink Lower Body Bathing: Simulated;Minimal assistance Where Assessed - Lower Body Bathing: Sit to stand from chair Lower Body Dressing: Simulated;Minimal assistance Where Assessed - Lower Body Dressing: Sit to stand from chair Toilet Transfer: Performed;Minimal assistance;Other (comment) (min guard) Toilet Transfer Method: Proofreader: Comfort height toilet Toileting - Hygiene: Performed;Minimal assistance;Other (comment) (min guard) Where Assessed - Toileting Hygiene: Standing Tub/Shower Transfer: Other (comment) (pt not ready to perform; 1" ledge; verbalizes sequence) Equipment Used: Rolling walker;Other (comment) (has Sports administrator; educated on adl uses) Vision/Perception  Vision - History Baseline Vision: No visual deficits Vision - Assessment Additional Comments: will have family assist for adls Cognition Cognition Arousal/Alertness: Awake/alert Overall Cognitive Status: Appears within functional limits for tasks assessed Orientation Level: Oriented X4 Sensation/Coordination Sensation Light Touch: Appears Intact Extremity Assessment RUE Assessment RUE Assessment: Within Functional Limits LUE Assessment LUE Assessment: Within Functional Limits Mobility  Bed Mobility Bed Mobility: Yes Supine to Sit: 4: Min assist;HOB elevated (Comment degrees) Supine to Sit Details (indicate cue type and reason): HOB approx 30 degrees, assist with left LE Sitting - Scoot to Edge of Bed: 4: Min assist Sitting - Scoot to [place] of Bed Details (indicate cue type and reason): for left LE Transfers Sit to Stand: 4: Min assist;With upper extremity assist;From bed Sit to Stand Details (indicate cue type and reason): cue for hand placement Stand to Sit: 4: Min assist Stand to Sit Details: cue for technique Exercises Total  Joint Exercises Ankle Circles/Pumps: AROM;Both;10 reps;Supine Quad Sets:  AROM;Both;10 reps;Supine Heel Slides: AAROM;Left;Supine (8 reps) Straight Leg Raises: AROM;Left;10 reps;Supine End of Session OT - End of Session Activity Tolerance: Patient tolerated treatment well Patient left: in chair;with call bell in reach;Other (comment) (lunch came; wants to sit up for it) General Behavior During Session: [facility] for tasks performed Cognition: [facility] for tasks performed   [facility] 11/17/2010, 1:54 PM

## 2010-11-17 NOTE — Progress Notes (Signed)
Physical Therapy Evaluation Patient Details Name: Shannon Moore MRN: [id number] DOB: 1945/03/17 Today's Date: 11/17/2010 1020-1055 Ev2  Problem List: There is no problem list on file for this patient.   Past Medical History:  Past Medical History  Diagnosis Date  . CHF (congestive heart failure)     1998 following zoloft administration/ neg cath 2005 and recent EF per MD note- Dr Cliffton Asters dated 09/28/10- clearence from Dr Cliffton Asters on chart  . Diabetes mellitus     oral med control  . 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 Chronic kidney disease     benign bladder tumor 10 yrs ago  . GERD (gastroesophageal reflux disease)   . Headache     after menopause resolved  . Hypertension     hyperlipidemia  . Arthritis     DDD/GOUT   Past Surgical History:  Past Surgical History  Procedure Date  . Tubal ligation   . Diagnostic laparoscopy   . Eye surgery     bilateral  RK 10 yrs ago  . Left and right knee arthroscopies- left 6 months ago     PT Assessment/Plan/Recommendation PT Assessment Clinical Impression Statement: Patient with left TKA presents with decreased ROM, strength, decreased activity tolerance with acute pain and will benefit from skilled PT in the acute care setting to maximize independence with mobility to allow d/c home with family assistance and HHPT. PT Recommendation/Assessment: Patient will need skilled PT in the acute care venue PT Problem List: Decreased strength;Decreased range of motion;Pain;Decreased knowledge of precautions PT Therapy Diagnosis : Difficulty walking;Acute pain PT Plan PT Frequency: 7X/week PT Treatment/Interventions: DME instruction;Gait training;Stair training;Therapeutic exercise;Patient/family education PT Recommendation Follow Up Recommendations: Home health PT Equipment Recommended: None recommended by PT PT Goals  Acute Rehab PT Goals PT Goal Formulation: With patient Time For Goal  Achievement: 7 days Pt will go Supine/Side to Sit: with modified independence PT Goal: Supine/Side to Sit - Progress: Progressing toward goal Pt will go Sit to Supine/Side: with modified independence PT Goal: Sit to Supine/Side - Progress: Progressing toward goal Pt will Stand: with modified independence;with no upper extremity support;1 - 2 min PT Goal: Stand - Progress: Progressing toward goal Pt will Ambulate: >150 feet;with modified independence;with rolling walker PT Goal: Ambulate - Progress: Progressing toward goal Pt will Go Up / Down Stairs: 1-2 stairs;with supervision;with rolling walker PT Goal: Up/Down Stairs - Progress: Progressing toward goal Pt will Perform Home Exercise Program: with supervision, verbal cues required/provided PT Goal: Perform Home Exercise Program - Progress: Progressing toward goal  PT Evaluation Precautions/Restrictions  Precautions Precautions: Knee Required Braces or Orthoses: Yes Knee Immobilizer: Discontinue once straight leg raise with < 10 degree lag (Patient able to perform SLR today without lag d/c'd KI) Restrictions Weight Bearing Restrictions: Yes LLE Weight Bearing: Weight bearing as tolerated Prior Functioning  Home Living Lives With: Spouse;Son Electra Help From: Family Type of Home: House Home Layout: One level Home Access: Stairs to enter Secretary/administrator of Steps: 1 Bathroom Shower/Tub: Health visitor: Standard Home Adaptive Equipment: Shower chair without back;Walker - rolling Prior Function Level of Independence: Independent with basic ADLs;Independent with gait Vocation: Full time employment Comments: 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 Cognition Cognition Arousal/Alertness: Awake/alert Overall Cognitive Status: Appears within functional limits for tasks assessed Orientation Level: Oriented X4 Sensation/Coordination Sensation Light Touch: Appears Intact Extremity Assessment RUE  Assessment RUE Assessment: Not tested LUE Assessment LUE Assessment: Not  tested RLE Assessment RLE Assessment: Within Functional Limits (reported gout flare right foot PTA, now controlled) LLE Assessment LLE Assessment: Exceptions to [facility] LLE AROM (degrees) Left Knee Extension 0-130: -5  (approximated) Left Knee Flexion 0-140: 40  (approximated) Left Ankle Dorsiflexion 0-20:  (WNL) LLE Strength Left Hip Flexion: 3+/5 Left Knee Flexion: 3-/5 Left Knee Extension: 3+/5 Left Ankle Dorsiflexion: 4/5 Mobility (including Balance) Bed Mobility Bed Mobility: Yes Supine to Sit: 4: Min assist;HOB elevated (Comment degrees) Supine to Sit Details (indicate cue type and reason): HOB approx 30 degrees, assist with left LE Sitting - Scoot to Edge of Bed: 4: Min assist Sitting - Scoot to [place] of Bed Details (indicate cue type and reason): for left LE Transfers Transfers: Yes Sit to Stand: 4: Min assist;With upper extremity assist;From bed Sit to Stand Details (indicate cue type and reason): cue for hand placement Stand to Sit: 4: Min assist Stand to Sit Details: cue for technique Ambulation/Gait Ambulation/Gait: Yes Ambulation/Gait Assistance: 4: Min assist Ambulation/Gait Assistance Details (indicate cue type and reason): for balance, safety; cues for sequence/technique Ambulation Distance (Feet): 150 Feet Assistive device: Rolling walker Gait Pattern: Decreased step length - right;Antalgic  Posture/Postural Control Posture/Postural Control: No significant limitations Exercise  Total Joint Exercises Ankle Circles/Pumps: AROM;Both;10 reps;Supine Quad Sets: AROM;Both;10 reps;Supine Heel Slides: AAROM;Left;Supine (8 reps) Straight Leg Raises: AROM;Left;10 reps;Supine End of Session PT - End of Session Equipment Utilized During Treatment: Gait belt Activity Tolerance: Patient tolerated treatment well Patient left: in chair;with call bell in reach Nurse Communication: Mobility status for  ambulation General Behavior During Session: [facility] for tasks performed Cognition: [facility] for tasks performed  [facility] 11/17/2010, 12:51 PM

## 2010-11-18 DIAGNOSIS — R52 Pain, unspecified: Secondary | ICD-10-CM

## 2010-11-18 LAB — BASIC METABOLIC PANEL
BUN: 19 mg/dL (ref 6–23)
Calcium: 8.7 mg/dL (ref 8.4–10.5)
Creatinine, Ser: 0.83 mg/dL (ref 0.50–1.10)
GFR calc Af Amer: 84 mL/min — ABNORMAL LOW (ref >90)
GFR calc non Af Amer: 72 mL/min — ABNORMAL LOW (ref >90)

## 2010-11-18 LAB — CBC
HCT: 31.5 % — ABNORMAL LOW (ref 36.0–46.0)
MCH: 29.5 pg (ref 26.0–34.0)
MCHC: 32.4 g/dL (ref 30.0–36.0)
MCV: 91 fL (ref 78.0–100.0)
RDW: 13.8 % (ref 11.5–15.5)

## 2010-11-18 LAB — GLUCOSE, CAPILLARY: Glucose-Capillary: 113 mg/dL — ABNORMAL HIGH (ref 70–99)

## 2010-11-18 MED ORDER — OXYCODONE HCL 5 MG PO TABS: 5.0000 mg | ORAL_TABLET | ORAL | Status: AC | PRN

## 2010-11-18 MED ORDER — DIPHENHYDRAMINE HCL 25 MG PO CAPS: 25.0000 mg | ORAL_CAPSULE | Freq: Four times a day (QID) | ORAL | Status: AC | PRN

## 2010-11-18 MED ORDER — ASPIRIN EC 325 MG PO TBEC: 325.0000 mg | DELAYED_RELEASE_TABLET | Freq: Two times a day (BID) | ORAL | Status: AC

## 2010-11-18 MED ORDER — ACETAMINOPHEN 325 MG PO TABS: 650.0000 mg | ORAL_TABLET | Freq: Four times a day (QID) | ORAL | Status: AC | PRN

## 2010-11-18 MED ORDER — FERROUS SULFATE 325 (65 FE) MG PO TABS: 325.0000 mg | ORAL_TABLET | Freq: Three times a day (TID) | ORAL | Status: DC

## 2010-11-18 MED ORDER — METHOCARBAMOL 500 MG PO TABS: 500.0000 mg | ORAL_TABLET | Freq: Four times a day (QID) | ORAL | Status: DC

## 2010-11-18 MED ORDER — RIVAROXABAN 10 MG PO TABS: 10.0000 mg | ORAL_TABLET | ORAL | Status: DC

## 2010-11-18 MED ORDER — OXYCODONE HCL 5 MG PO TABS
5.0000 mg | ORAL_TABLET | ORAL | Status: DC | PRN
  Administered 2010-11-18 (×2): 10 mg via ORAL
  Filled 2010-11-18 (×2): qty 2

## 2010-11-18 NOTE — Discharge Summary (Signed)
Physician Discharge Summary  Patient ID: Shannon Moore MRN: [id number] DOB/AGE: 09/19/1945 65 y.o.  Admit date: 11/16/2010 Discharge date: 11/18/2010  Procedures:  Procedure(s) (LRB): TOTAL KNEE ARTHROPLASTY (Left)  Attending Physician: Shelda Pal MD  Admission Diagnoses: Left knee OA  Discharge Diagnoses:  Hypertension Diabetes Hyperlipidemia Gout Allergies  PCP: @LABRRPCP @   Procedures:  Procedure(s) (LRB): TOTAL KNEE ARTHROPLASTY (Left)  Discharged Condition: good  Hospital Course:  Patient underwent the above stated procedure on 11/16/2010. Patient tolerated the procedure well and brought to the recover room in good condition and subsequently to the floor.  POD #1 - 11/17/10 Neurovascular intact, Sensation intact distally, Intact pulses distally, Dorsiflexion/Plantar flexion intact, Incision: dressing C/D/I, No cellulitis present, and Compartment soft. Pt's foley catheter and HV drain were removed and IV was changed to saline lock. LABS   Basename  11/17/10 0356   HGB  11.2*   HCT  33.1*    POD #2 - 11/18/2010 Neurovascular intact, Sensation intact distally, Intact pulses distally, Dorsiflexion/Plantar flexion intact, Incision: dressing C/D/I, No cellulitis present and Compartment soft. Pt did state that she was still having some pain with the Norco and was changed to Oxycodone. Patient was felt to be doing well enough to be discharged home with HHPT. LABS   Basename 11/18/10 0410    HGB  10.2*    HCT  31.5*       Discharge Exam: Blood pressure 134/78, pulse 79, temperature 98 F (36.7 C), temperature source Oral, resp. rate 20, height 5\' 1"  (1.549 m), weight 79.379 kg (175 lb), SpO2 92.00%. Extremities: Homans sign is negative, no sign of DVT, no edema, redness or tenderness in the calves or thighs and no ulcers, gangrene or trophic changes  Disposition:    Current Discharge Medication List    START taking these medications   Details  acetaminophen  (TYLENOL) 325 MG tablet Take 2 tablets (650 mg total) by mouth every 6 (six) hours as needed (or Fever >/= 101). Qty: 30 tablet, Refills: 0    aspirin EC 325 MG tablet Take 1 tablet (325 mg total) by mouth 2 (two) times daily. Qty: 60 tablet, Refills: 0    diphenhydrAMINE (BENADRYL) 25 mg capsule Take 1 capsule (25 mg total) by mouth every 6 (six) hours as needed for itching, allergies or sleep. Qty: 30 capsule, Refills: 0    ferrous sulfate 325 (65 FE) MG tablet Take 1 tablet (325 mg total) by mouth 3 (three) times daily after meals. Qty: 60 tablet, Refills: 0    oxyCODONE (OXY IR/ROXICODONE) 5 MG immediate release tablet Take 1-2 tablets (5-10 mg total) by mouth every 4 (four) hours as needed for pain (q 4-6 hours prn pain). Qty: 100 tablet, Refills: 0    rivaroxaban (XARELTO) 10 MG TABS tablet Take 1 tablet (10 mg total) by mouth daily. Qty: 12 tablet, Refills: 0      CONTINUE these medications which have CHANGED   Details  methocarbamol (ROBAXIN) 500 MG tablet Take 1 tablet (500 mg total) by mouth 4 (four) times daily. As needed for muscle spasms. Qty: 50 tablet, Refills: 0      CONTINUE these medications which have NOT CHANGED   Details  allopurinol (ZYLOPRIM) 300 MG tablet Take 300 mg by mouth at bedtime.     B Complex-C (SUPER B COMPLEX) TABS Take 1 tablet by mouth daily.     cetirizine (ZYRTEC) 10 MG tablet Take 10 mg by mouth daily.     CINNAMON PO Take  1,000 mg by mouth 2 (two) times daily. Hold while in hospital.    fluticasone (FLONASE) 50 MCG/ACT nasal spray Place 1-2 sprays into the nose 2 (two) times daily.     metFORMIN (GLUCOPHAGE-XR) 500 MG 24 hr tablet Take 1,000 mg by mouth daily with supper.     olmesartan-hydrochlorothiazide (BENICAR HCT) 20-12.5 MG per tablet Take 1 tablet by mouth at bedtime.     pravastatin (PRAVACHOL) 80 MG tablet Take 80 mg by mouth at bedtime.          Signed: Anastasio Auerbach. Theus Espin   PAC  11/18/2010, 10:10 AM

## 2010-11-18 NOTE — Progress Notes (Signed)
  Subjective: 2 Days Post-Op Procedure(s) (LRB): TOTAL KNEE ARTHROPLASTY (Left)  Patient reports pain as severe.  Objective:   VITALS:   Filed Vitals:   11/18/10 0530  BP: 134/78  Pulse: 79  Temp: 98 F (36.7 C)  Resp: 20    Neurovascular intact Sensation intact distally Intact pulses distally Dorsiflexion/Plantar flexion intact Incision: dressing C/D/I No cellulitis present Compartment soft  LABS  Basename 11/18/10 0410 11/17/10 0356  HGB 10.2* 11.2*  HCT 31.5* 33.1*  WBC 9.0 11.2*  PLT 280 282     Basename 11/18/10 0410 11/17/10 0356  NA 138 136  K 3.8 4.0  BUN 19 23  CREATININE 0.83 0.76  GLUCOSE 122* 200*    Assessment/Plan: 2 Days Post-Op Procedure(s) (LRB): TOTAL KNEE ARTHROPLASTY (Left)   Advance diet Up with therapy D/C IV fluids Discharge home with home health   Anastasio Auerbach. Grady Lucci   PAC  11/18/2010, 8:01 AM

## 2010-11-18 NOTE — Progress Notes (Signed)
Physical Therapy Treatment Patient Details Name: Shannon Moore MRN: [id number] DOB: 02/09/1945 Today's Date: 11/18/2010 5621-3086 1GT PT Assessment/Plan  PT - Assessment/Plan Comments on Treatment Session: educated spouse how to assist with steps and to adjust rolling walker for height. PT Plan: Discharge plan remains appropriate;Other (comment) (to discharge home today, so will d/c to HHPT) PT Frequency: 7X/week Follow Up Recommendations: Home health PT PT Goals  Acute Rehab PT Goals PT Goal: Supine/Side to Sit - Progress: Partly met (due to discharge home today.) PT Goal: Sit to Supine/Side - Progress: Partly met PT Goal: Stand - Progress: Partly met PT Goal: Ambulate - Progress: Partly met PT Goal: Up/Down Stairs - Progress: Partly met PT Goal: Perform Home Exercise Program - Progress: Partly met  PT Treatment Precautions/Restrictions  Precautions Precautions: Knee Precaution Booklet Issued: No Required Braces or Orthoses: No Knee Immobilizer: Other (comment) (discontinued by PT) Restrictions Weight Bearing Restrictions: Yes LLE Weight Bearing: Weight bearing as tolerated Mobility (including Balance) Bed Mobility Bed Mobility: Yes Supine to Sit: 4: Min assist Supine to Sit Details (indicate cue type and reason): for left LE Sitting - Scoot to Edge of Bed: 4: Min assist Sitting - Scoot to [place] of Bed Details (indicate cue type and reason): for left LE  Transfers Transfers: Yes Sit to Stand: 5: Supervision;From bed;With upper extremity assist Stand to Sit: To chair;5: Supervision Stand to Sit Details: cues for left leg out, hand placement Ambulation/Gait Ambulation/Gait: Yes Ambulation/Gait Assistance: 5: Supervision Ambulation/Gait Assistance Details (indicate cue type and reason): more antalgic today Ambulation Distance (Feet): 60 Feet Assistive device: Rolling walker Gait Pattern: Antalgic;Decreased stride length Stairs: Yes Stairs Assistance: 4: Min  assist Stairs Assistance Details (indicate cue type and reason): cues for technique Stair Management Technique: With walker;Backwards Number of Stairs: 1   Balance Balance Assessed:No  End of Session PT - End of Session Equipment Utilized During Treatment: Gait belt Activity Tolerance: Patient tolerated treatment well Patient left: in chair;with family/visitor present General Behavior During Session: [facility] for tasks performed Cognition: [facility] for tasks performed  [facility] 11/18/2010, 11:20 AM

## 2010-11-18 NOTE — Progress Notes (Signed)
Physical Therapy Treatment Patient Details Name: Shannon Moore MRN: [id number] DOB: [birth year] Today's Date: 11/18/2010 [id number] 1TE PT Assessment/Plan  PT - Assessment/Plan Comments on Treatment Session: Patient reports increased pain overnight.  Better control now, but unable to lift leg as independent as yesterday.  Just recieved meds prior to my arrival to help her out of bathroom.  Will benefit from one more session prior to d/c for stair training and gait. PT Plan: Discharge plan remains appropriate PT Frequency: 7X/week Follow Up Recommendations: Home health PT PT Goals     PT Treatment Precautions/Restrictions  Precautions Precautions: Knee Precaution Booklet Issued: No Required Braces or Orthoses: No Knee Immobilizer: Other (comment) (discontinued by PT) Restrictions Weight Bearing Restrictions: Yes LLE Weight Bearing: Weight bearing as tolerated Mobility (including Balance) Bed Mobility Bed Mobility: Yes Sit to Supine - Left: 4: Min assist Sit to Supine - Left Details (indicate cue type and reason): for left leg Transfers Transfers: Yes Stand to Sit: 4: Min assist Stand to Sit Details: cues for left leg out, hand placement Ambulation/Gait Ambulation/Gait: Yes Ambulation/Gait Assistance: 4: Min assist Ambulation/Gait Assistance Details (indicate cue type and reason): more antalgic today Ambulation Distance (Feet): 12 Feet Assistive device: Rolling walker Gait Pattern: Antalgic  Balance Balance Assessed: Yes Static Standing Balance Static Standing - Balance Support: No upper extremity supported (stood to wash hands at sink after toileting, about 1 min.) Static Standing - Level of Assistance: 5: Stand by assistance Exercise  Total Joint Exercises Ankle Circles/Pumps: AROM;Both;10 reps;Seated Quad Sets: AROM;Left;10 reps;Supine Towel Squeeze: AROM;Left;5 reps;Supine Short Arc Quad: AROM;Left;Supine;10 reps Heel Slides: AROM;Left;10 reps;Seated Hip  ABduction/ADduction: AROM;Left;10 reps;Supine Straight Leg Raises: AAROM;Left;Other reps (comment);Supine (8 reps) Issued handout for HEP and reviewed during exercises today. End of Session PT - End of Session Activity Tolerance: Patient limited by pain Patient left: in bed;with call bell in reach General Behavior During Session: [facility] for tasks performed Cognition: [facility] for tasks performed  [facility] 11/18/2010, 11:04 AM

## 2010-11-19 ENCOUNTER — Encounter (HOSPITAL_COMMUNITY): Payer: Self-pay | Admitting: Orthopedic Surgery

## 2010-11-23 NOTE — H&P (Signed)
Shannon Moore, Shannon Moore                ACCOUNT NO.:  [id number]  MEDICAL RECORD NO.:  [id number]  LOCATION:                                 FACILITY:  PHYSICIAN:  Madlyn Frankel. Charlann Boxer, M.D.  DATE OF BIRTH:  [birth year]  DATE OF ADMISSION:  11/16/2010 DATE OF DISCHARGE:                             HISTORY & PHYSICAL   DATE OF SURGERY:  November 16, 2010.  ADMITTING DIAGNOSIS:  Osteoarthritis, left knee.  HISTORY OF PRESENT ILLNESS:  This is a 65 year old lady with a history of osteoarthritis of the left knee with failure of conservative treatment to alleviate her symptoms.  After discussion of treatment, benefits, risks, and options, the patient is now scheduled for total knee arthroplasty of the left knee.  Her medical doctor is Dr. Laurann Montana.  She will be going home after surgery.  She is a candidate for tranexamic acid and will receive that at surgery.  She is given her home medicines of iron, Robaxin, aspirin, MiraLax, and Colace.  PAST MEDICAL HISTORY:  Drug allergies to PENICILLIN with a rash.  SULFA with itching.  ZOLOFT with heart failure.  CURRENT MEDICATIONS: 1. Metformin 500 mg two every dinner. 2. Pravastatin 80 mg 1 daily. 3. Benicar/hydrochlorothiazide 1 daily. 4. Allopurinol 300 mg 1 daily. 5. Zyrtec 10 mg 1 daily.  PREVIOUS SURGERIES:  Include a bladder tumor, tubal ligation, right knee scope, and left knee scoped.  SERIOUS MEDICAL ILLNESSES:  Include hypertension, diabetes, hyperlipidemia, gout, and allergies.  FAMILY HISTORY:  Positive for diabetes and arthritis.  SOCIAL HISTORY:  The patient is married.  She does not smoke and drinks 1 glass of wine per day.  REVIEW OF SYSTEMS:  CENTRAL NERVOUS SYSTEM:  Negative for headache, blurred vision, or dizziness.  PULMONARY:  Negative for shortness of breath, except exertional shortness of breath.  CARDIOVASCULAR: Positive for history of heart failure in 1998.  Cardiac cath since then x2 without any pertinent  findings.  No chest pain or regular heart beat. GI:  Negative for ulcers, hepatitis.  GU:  Negative for urinary tract difficulty.  MUSCULOSKELETAL:  Positive in HPI.  PHYSICAL EXAMINATION:  VITAL SIGNS:  BP 134/72, respirations 16, pulse 68 and regular.  GENERAL APPEARANCE:  This is a well-developed, well- nourished lady, in no acute distress. HEENT:  Head, normocephalic.  Nose patent.  Ears patent.  Pupils equal, round, and reactive to light.  Throat without injection. NECK:  Supple without adenopathy.  Carotids 2+ without bruit.  CHEST: Clear to auscultation.  No rales, rhonchi.  Respirations 16.  HEART: Regular rate and rhythm at 68 beats per minute without murmur. ABDOMEN:  Soft.  Active bowel sounds.  No masses, organomegaly. NEUROLOGIC:  The patient is alert and orient to time, place, and person. Cranial nerves 2-12 grossly intact. EXTREMITIES:  Shows left knee with 5 degrees flexion contracture, further flexion to 120.  Neurovascular status intact.  IMPRESSION:  Osteoarthritis, left knee.  PLAN:  Total knee arthroplasty, left knee.     Jaquelyn Bitter. Chabon, P.A.   ______________________________ Madlyn Frankel Charlann Boxer, M.D.    SJC/MEDQ  D:  11/11/2010  T:  11/11/2010  Job:  [id number]  Electronically Signed  by Jodene Nam P.A. on 11/16/2010 09:39:39 AM Electronically Signed by Durene Romans M.D. on 11/23/2010 09:35:11 AM

## 2010-12-01 NOTE — Op Note (Signed)
NAME:  Shannon Moore                      MEDICAL RECORD NO.:  [id number]                             FACILITY:  [facility]      PHYSICIAN:  Madlyn Frankel. Charlann Boxer, M.D.  DATE OF BIRTH:  Jan 11, 1946      DATE OF PROCEDURE:  11/16/2010                                     OPERATIVE REPORT         PREOPERATIVE DIAGNOSIS:  Left knee osteoarthritis.      POSTOPERATIVE DIAGNOSIS:  Left knee osteoarthritis.      FINDINGS:  The patient was noted to have complete loss of cartilage and   bone-on-bone arthritis in the medial and patellofemoral compartments of   his knee with a significant synovitis and associated large effusion.      PROCEDURE:  Left total knee replacement.      COMPONENTS USED:  DePuy rotating platform posterior stabilized knee   system, a size 2 femur, 2 tibia, 12.5 mm insert, and 35 patellar   button.      SURGEON:  Madlyn Frankel. Charlann Boxer, M.D.      ASSISTANT:  Lanney Gins, PA-C.      ANESTHESIA:  Spinal.      SPECIMENS:  None.      COMPLICATION:  None.      DRAINS:  One Hemovac.      TOURNIQUET TIME:   Total Tourniquet Time Documented: Thigh (Left) - 35 minutes .      The patient was stable to the recovery room.      INDICATION FOR PROCEDURE:  Shannon Moore is a 65 y.o. female patient of   mine.  The patient had been seen, evaluated, and treated conservatively in the   office with medication, activity modification, and injections.  The patient had   radiographic changes of bone-on-bone arthritis with endplate sclerosis and osteophytes noted.      The patient failed conservative measures including medication, injections, and activity modification, and at this point was ready for more definitive measures.   Based on his radiographic changes and failed conservative measures, the patient   decided to proceed with total knee replacement.  Risks of infection,   DVT, component failure, need for revision surgery, postop course, and   expectations particularly given his chronic  pain issues were all   discussed and reviewed.  Consent was obtained for benefit of pain   relief.      PROCEDURE IN DETAIL:  The patient was brought to the operative theater.   Once adequate anesthesia, preoperative antibiotics, Ancef administered 1g, he was positioned supine with the right thigh tourniquet placed.  The   right lower extremity was prepped and draped in sterile fashion.  A time-   out was performed identifying the patient, planned procedure, and   extremity.      The left lower extremity was placed in the Mayo leg holder.  Leg was   exsanguinated, tourniquet elevated to 250 mmHg.  A midline incision was   made followed by median parapatellar arthrotomy following initial   exposure.  Attention was first directed to the patella.  Precut   measurement  was noted to be 23 mm.  I resected down to 13 mm and used a   35 patellar button to restore patellar height as well as cover the cut   surface.      The lug holes were drilled and a metal Shim was placed to protect the   patella from retractors and saw blades.      At this point, attention was now directed to the femur.  The femoral   canal was opened with a drill, irrigated to try to prevent fat emboli.   Intramedullary rod was passed at 3 degrees valgus, 10 mm of bone   resected off the distal femur.  Following this resection, the tibia was   subluxated anteriorly.  Using extramedullary guide, 10 mm of bone off   the least effected lateral proximal tibia was resected.  We confirmed the gap would be   stable medially and laterally with a 10 mm insert as well as confirmed   the cut, was perpendicular in the coronal plane.      Once this was done, I sized the femur to be a size 2 in the anterior-   posterior dimension, chose a 2 component based on medial and   lateral dimension.  The size 2 rotation block was then pinned in   position anterior reference using the C-clamp to set rotation.  The   anterior posterior chamfer  cuts were made without difficulty nor   notching making certain that I was along the anterior cortex to help   with flexion gap stability.      The final box cut was made off the lateral aspect of distal femur.      At this point, the tibia was sized to be a size 2, the size 2 tray was   then pinned in position through the medial third of the tubercle,   drilled, and keel punched.  Trial reduction was now carried with a 2 femur,  2 tibia, a 12.5 mm insert, and the 35 patella botton.  The knee was brought to   extension, full extension with good flexion stability with the patella   tracking through the trochlea without application of pressure.  Given   all these findings, the trial components removed.  Final components were   opened and cement was mixed.  The knee was irrigated with normal saline   solution and pulse lavage.  The synovial lining was   then injected with 0.25% Marcaine with epinephrine and 1 cc of Toradol,   total of 61 cc.      The knee was irrigated.  Final implants were then cemented onto clean and   dried cut surfaces of bone with the knee brought to extension with a 12.5   mm insert.      Once the cement had fully cured, the excess cement was removed   throughout the knee.  I confirmed I was satisfied with the range of   motion and stability, and the final 12.5 mm insert was chosen.  It was   placed into the knee.      The tourniquet had been let down at 35 minutes.  No significant   hemostasis required.  The medium Hemovac drain was placed deep.  The   extensor mechanism was then reapproximated using #1 Vicryl with the knee   in flexion.  The   remaining wound was closed with 2-0 Vicryl and running 4-0 Monocryl.   The knee was cleaned, dried,  dressed sterilely using Dermabond and   Aquacel dressing.  Drain site dressed separately.  The patient was then   brought to recovery room in stable condition, tolerating the procedure   well.   Please note that  Physician Assistant, Lanney Gins, was present for the entirety of the case, and was utilized for pre-operative positioning, peri-operative retractor management, general facilitation of the procedure.  He was also utilized for primary wound closure at the end of the case.              Madlyn Frankel Charlann Boxer, M.D.

## 2011-06-10 ENCOUNTER — Other Ambulatory Visit: Payer: Self-pay | Admitting: Family Medicine

## 2011-06-10 DIAGNOSIS — E049 Nontoxic goiter, unspecified: Secondary | ICD-10-CM

## 2011-06-17 ENCOUNTER — Ambulatory Visit
Admission: RE | Admit: 2011-06-17 | Discharge: 2011-06-17 | Disposition: A | Discharge status: Home / Self Care | Payer: Medicare Other | Source: Ambulatory Visit | Attending: Family Medicine | Admitting: Family Medicine

## 2011-06-17 DIAGNOSIS — E049 Nontoxic goiter, unspecified: Secondary | ICD-10-CM

## 2012-05-09 ENCOUNTER — Other Ambulatory Visit: Payer: Self-pay | Admitting: Otolaryngology

## 2012-05-09 DIAGNOSIS — H903 Sensorineural hearing loss, bilateral: Secondary | ICD-10-CM

## 2012-05-09 DIAGNOSIS — G510 Bell's palsy: Secondary | ICD-10-CM

## 2012-05-13 ENCOUNTER — Ambulatory Visit
Admission: RE | Admit: 2012-05-13 | Discharge: 2012-05-13 | Disposition: A | Discharge status: Home / Self Care | Payer: Medicare Other | Source: Ambulatory Visit | Attending: Otolaryngology | Admitting: Otolaryngology

## 2012-05-13 DIAGNOSIS — H903 Sensorineural hearing loss, bilateral: Secondary | ICD-10-CM

## 2012-05-13 DIAGNOSIS — G510 Bell's palsy: Secondary | ICD-10-CM

## 2012-05-13 MED ORDER — GADOBENATE DIMEGLUMINE 529 MG/ML IV SOLN
14.0000 mL | Freq: Once | INTRAVENOUS | Status: AC | PRN
  Administered 2012-05-13: 14 mL via INTRAVENOUS

## 2013-09-05 ENCOUNTER — Other Ambulatory Visit (HOSPITAL_COMMUNITY)
Admission: RE | Admit: 2013-09-05 | Discharge: 2013-09-05 | Disposition: A | Discharge status: Home / Self Care | Payer: Medicare PPO | Source: Ambulatory Visit | Attending: Family Medicine | Admitting: Family Medicine

## 2013-09-05 ENCOUNTER — Other Ambulatory Visit: Payer: Self-pay | Admitting: Family Medicine

## 2013-09-05 DIAGNOSIS — Z124 Encounter for screening for malignant neoplasm of cervix: Secondary | ICD-10-CM | POA: Diagnosis present

## 2013-09-06 LAB — CYTOLOGY - PAP

## 2014-01-08 ENCOUNTER — Encounter (HOSPITAL_COMMUNITY): Payer: Self-pay | Admitting: Cardiology

## 2014-01-08 ENCOUNTER — Ambulatory Visit
Admission: RE | Admit: 2014-01-08 | Discharge: 2014-01-08 | Disposition: A | Discharge status: Home / Self Care | Payer: Commercial Managed Care - HMO | Source: Ambulatory Visit | Attending: Cardiology | Admitting: Cardiology

## 2014-01-08 ENCOUNTER — Other Ambulatory Visit: Payer: Self-pay | Admitting: Cardiology

## 2014-01-08 DIAGNOSIS — R0600 Dyspnea, unspecified: Secondary | ICD-10-CM | POA: Insufficient documentation

## 2014-01-08 DIAGNOSIS — R079 Chest pain, unspecified: Secondary | ICD-10-CM

## 2014-01-08 DIAGNOSIS — R0609 Other forms of dyspnea: Secondary | ICD-10-CM | POA: Insufficient documentation

## 2014-01-08 DIAGNOSIS — E669 Obesity, unspecified: Secondary | ICD-10-CM

## 2014-01-08 DIAGNOSIS — R002 Palpitations: Secondary | ICD-10-CM | POA: Insufficient documentation

## 2014-01-08 DIAGNOSIS — E785 Hyperlipidemia, unspecified: Secondary | ICD-10-CM

## 2014-01-08 DIAGNOSIS — I10 Essential (primary) hypertension: Secondary | ICD-10-CM

## 2014-01-08 DIAGNOSIS — R9431 Abnormal electrocardiogram [ECG] [EKG]: Secondary | ICD-10-CM

## 2014-01-08 DIAGNOSIS — E119 Type 2 diabetes mellitus without complications: Secondary | ICD-10-CM

## 2014-01-08 HISTORY — DX: Type 2 diabetes mellitus without complications: E11.9

## 2014-01-08 HISTORY — DX: Obesity, unspecified: E66.9

## 2014-01-08 HISTORY — DX: Essential (primary) hypertension: I10

## 2014-01-08 HISTORY — DX: Hyperlipidemia, unspecified: E78.5

## 2014-01-08 NOTE — H&P (Signed)
Shannon Moore  Date of visit:  01/08/2014 DOB:  Mar 03, 1945    Age:  68 yrs. Medical record number:  [id number]     Account number:  [id number] Primary Care Provider: WHITE, CYNTHIA ____________________________ CURRENT DIAGNOSES  1. Palpitations  2. Dyspnea, unspecified  3. Chest pain  4. Abnormal electrocardiogram [ECG]  5. Type 2 diabetes mellitus without complications  6. Essential (primary) hypertension  7. Hyperlipidemia, unspecified  8. Obesity ____________________________ ALLERGIES  Atorvastatin, Intolerance-unknown  Penicillins, Intolerance-unknown  Sulfa (Sulfonamide Antibiotics), Intolerance-unknown  Zoloft, Intolerance-unknown ____________________________ MEDICATIONS  1. Calcium-Vitamin D 600 mg calcium- 400 unit tablet, 1 p.o. q.d.  2. Fish Oil 300-1,000 mg capsule, 1 p.o. q.d.  3. Zyrtec 10 mg tablet, 1 p.o. daily  4. biotin 10 mg tablet, 1 p.o. daily  5. vitamin B complex tablet, 1 p.o. daily  6. Aleve 220 mg tablet, PRN  7. fluticasone 50 mcg/actuation nasal spray,suspension, qd  8. Pravachol 80 mg tablet, 1 p.o. daily  9. Benicar HCT 20 mg-12.5 mg tablet, 1 p.o. daily  10. metformin 500 mg tablet, 2 p.o. daily  11. allopurinol 300 mg tablet, 1 p.o. daily  12. aspirin 81 mg chewable tablet, 1 p.o. daily  13. indomethacin 50 mg capsule, PRN ____________________________ CHIEF COMPLAINTS  Discomfort l chest   dyspnea  Palp  Racing pulse ____________________________ HISTORY OF PRESENT ILLNESS This 68 year old female seen at the request of Dr. Dema Severin for evaluation of chest pain, palpitations, and a markedly abnormal EKG. She has a history of obesity, diabetes, and hypertension and had a catheterization done in 1994. This did not reveal any significant disease. She was seen for chest discomfort in 2005 at which point in time she had an abnormal EKG. A myocardial perfusion scan showed possible anterior ischemia and she underwent catheterization that showed mild coronary  artery disease but nothing obstructive. I have not really seen her now in 10 years.  Over the past 6 months she has had some exertional substernal chest discomfort that radiated through to her mid back. She began to have more discomfort around the Christmas time and would have to stop activities and rest to make it go away.  She has also had some dyspnea with activities.  In addition she complained of palpitations and a racing heartbeat and noted markedly labile blood pressures. She was seen yesterday and is seen here today. She has not had recurrent chest discomfort since December. She has gained weight over the years. She does not have PND, orthopnea or edema. ____________________________ PAST HISTORY  Past Medical Illnesses:  hyperlipidemia, hypertension, migraine headaches, type 2 diabetes, gout, obesity;  Cardiovascular Illnesses:  history of CHF;  Surgical Procedures:  tubal ligation, bladder tumor, knee replacement-left;  NYHA Classification:  II;  Canadian Angina Classification:  Class 3: Angina with mild exertion;  Cardiology Procedures-Invasive:  cardiac cath (left) 1994, cardiac cath (left) November 2005;  Cardiology Procedures-Noninvasive:  treadmill cardiolite November 2005;  Cardiac Cath Results:  no significant disease;  LVEF of 60% documented via cardiac cath on 11/16/2003,   ____________________________ CARDIO-PULMONARY TEST DATES EKG Date:  01/08/2014;   Cardiac Cath Date:  06/06/1992;  Nuclear Study Date:  11/21/2003;  Echocardiography Date: 06/06/1992;  Chest Xray Date: 11/25/2003;   ____________________________ FAMILY HISTORY Father -- Father dead, Diabetes mellitus Mother -- Mother dead, Hypertension, TIA, Senile dementia Sister -- Sister alive with problem, Multiple sclerosis ____________________________ SOCIAL HISTORY Alcohol Use:  socially;  Smoking:  used to smoke but quit Prior to 1980;  Diet:  regular diet;  Lifestyle:  married;  Exercise:  no regular exercise;  Occupation:   Scientist, physiological and  speech therapy;  Residence:  lives with husband and children;   ____________________________ REVIEW OF SYSTEMS General:  obesity  Integumentary:no rashes or new skin lesions. Eyes: history of Lasik surgery Ears, Nose, Throat, Mouth:  denies any hearing loss, epistaxis, hoarseness or difficulty speaking. Respiratory: dyspnea with exertion Cardiovascular:  please review HPI Abdominal: denies dyspepsia, GI bleeding, constipation, or diarrheaGenitourinary-Female: post menopausal without hormonal replacement Musculoskeletal:  history of gout Neurological:  history of migraine headaches Psychiatric:  denies depession or anxiety Hematological/Immunologic:  denies any food allergies, bleeding disorders. ____________________________ PHYSICAL EXAMINATION VITAL SIGNS  Blood Pressure:  148/70 Sitting, Right arm, regular cuff  , 150/76 Standing, Right arm and regular cuff   Pulse:  110/min. Weight:  177.00 lbs. Height:  62"BMI: 32  Constitutional:  pleasant white female, in no acute distress, moderately obese Skin:  warm and dry to touch, no apparent skin lesions, or masses noted. Head:  normocephalic, normal hair pattern, no masses or tenderness Eyes:  EOMS Intact, PERRLA, C and S clear, Funduscopic exam not done. ENT:  ears, nose and throat reveal no gross abnormalities.  Dentition good. Neck:  supple, without massess. No JVD, thyromegaly or carotid bruits. Carotid upstroke normal. Chest:  normal symmetry, clear to auscultation. Cardiac:  regular rhythm, normal S1 and S2, No S3 or S4, no murmurs, gallops or rubs detected. Abdomen:  abdomen soft,non-tender, no masses, no hepatospenomegaly, or aneurysm noted Peripheral Pulses:  the femoral,dorsalis pedis, and posterior tibial pulses are full and equal bilaterally with no bruits auscultated. Extremities & Back:  no deformities, clubbing, cyanosis, erythema or edema observed. Normal muscle strength and tone. Neurological:  no gross motor or  sensory deficits noted, affect appropriate, oriented x3. ____________________________ MOST RECENT LIPID PANEL 09/05/13  CHOL TOTL 169 mg/dl, LDL 87 NM, HDL 48 mg/dl, TRIGLYCER 169 mg/dl and CHOL/HDL 3.5 (Calc) ____________________________ IMPRESSIONS/PLAN  1. Chest discomfort with some typical other atypical features in a patient with a markedly abnormal baseline EKG and previously abnormal myocardial perfusion scan. She is at increased risk for ischemic events and have recommended that she have cardiac catheterization to assess this. 2. Type 2 diabetes without complications 3. Hypertension 4. Hyperlipidemia under treatment 5. Obesity with need to lose weight 6. Palpitiations  Recommendations:  I would recommend that she wear a cardiac event monitor to assess the palpitations. In addition I would suggest because of the markedly abnormal baseline EKG with diffuse ST segment depression and T-wave inversion that she undergo cardiac catheterization. Cardiac catheterization was discussed with the patient including risks of myocardial infarction, death, stroke, bleeding, arrhythmia, dye allergy, or renal insufficiency. He understands and is willing to proceed. Possibility of percutaneous intervention at the same setting was also discussed with the patient including risks. In addition she will have an echocardiogram to assess for structural heart and valvular disease as a cause for her symptoms. ____________________________ TODAYS ORDERS  1. Chest X-ray PA/Lat: today  2. Comprehensive Metabolic Panel: Today  3. Complete Blood Count: Today  4. Draw PT/INR: Today  5. PTT: Today  6. 12 Lead EKG: Today                       ____________________________ Cardiology Physician:  Kerry Hough MD [facility]

## 2014-01-10 ENCOUNTER — Encounter (HOSPITAL_COMMUNITY): Payer: Self-pay | Admitting: Cardiology

## 2014-01-10 ENCOUNTER — Ambulatory Visit (HOSPITAL_COMMUNITY)
Admission: RE | Admit: 2014-01-10 | Discharge: 2014-01-10 | Disposition: A | Discharge status: Home / Self Care | Payer: Commercial Managed Care - HMO | Source: Ambulatory Visit | Attending: Cardiology | Admitting: Cardiology

## 2014-01-10 ENCOUNTER — Encounter (HOSPITAL_COMMUNITY)
Admission: RE | Disposition: A | Discharge status: Home / Self Care | Payer: Self-pay | Source: Ambulatory Visit | Attending: Cardiology

## 2014-01-10 DIAGNOSIS — Z7982 Long term (current) use of aspirin: Secondary | ICD-10-CM | POA: Diagnosis not present

## 2014-01-10 DIAGNOSIS — M109 Gout, unspecified: Secondary | ICD-10-CM | POA: Insufficient documentation

## 2014-01-10 DIAGNOSIS — E785 Hyperlipidemia, unspecified: Secondary | ICD-10-CM | POA: Diagnosis not present

## 2014-01-10 DIAGNOSIS — I10 Essential (primary) hypertension: Secondary | ICD-10-CM

## 2014-01-10 DIAGNOSIS — Z87891 Personal history of nicotine dependence: Secondary | ICD-10-CM | POA: Diagnosis not present

## 2014-01-10 DIAGNOSIS — G43909 Migraine, unspecified, not intractable, without status migrainosus: Secondary | ICD-10-CM | POA: Insufficient documentation

## 2014-01-10 DIAGNOSIS — R079 Chest pain, unspecified: Secondary | ICD-10-CM

## 2014-01-10 DIAGNOSIS — I509 Heart failure, unspecified: Secondary | ICD-10-CM | POA: Diagnosis not present

## 2014-01-10 DIAGNOSIS — E119 Type 2 diabetes mellitus without complications: Secondary | ICD-10-CM

## 2014-01-10 DIAGNOSIS — R9431 Abnormal electrocardiogram [ECG] [EKG]: Secondary | ICD-10-CM

## 2014-01-10 DIAGNOSIS — E669 Obesity, unspecified: Secondary | ICD-10-CM | POA: Insufficient documentation

## 2014-01-10 HISTORY — DX: Essential (primary) hypertension: I10

## 2014-01-10 HISTORY — DX: Gout, unspecified: M10.9

## 2014-01-10 HISTORY — DX: Hyperlipidemia, unspecified: E78.5

## 2014-01-10 HISTORY — DX: Type 2 diabetes mellitus without complications: E11.9

## 2014-01-10 HISTORY — DX: Obesity, unspecified: E66.9

## 2014-01-10 HISTORY — PX: LEFT HEART CATHETERIZATION WITH CORONARY ANGIOGRAM: SHX5451

## 2014-01-10 LAB — GLUCOSE, CAPILLARY
Glucose-Capillary: 168 mg/dL — ABNORMAL HIGH (ref 70–99)
Glucose-Capillary: 125 mg/dL — ABNORMAL HIGH (ref 70–99)

## 2014-01-10 SURGERY — LEFT HEART CATHETERIZATION WITH CORONARY ANGIOGRAM
Anesthesia: LOCAL

## 2014-01-10 MED ORDER — HEPARIN (PORCINE) IN NACL 2-0.9 UNIT/ML-% IJ SOLN
INTRAMUSCULAR | Status: AC
  Filled 2014-01-10: qty 1000

## 2014-01-10 MED ORDER — ASPIRIN 81 MG PO CHEW
CHEWABLE_TABLET | ORAL | Status: AC
  Filled 2014-01-10: qty 1

## 2014-01-10 MED ORDER — SODIUM CHLORIDE 0.9 % IJ SOLN: 3.0000 mL | INTRAMUSCULAR | Status: DC | PRN

## 2014-01-10 MED ORDER — SODIUM CHLORIDE 0.9 % IV SOLN: 1.0000 mL/kg/h | INTRAVENOUS | Status: DC

## 2014-01-10 MED ORDER — ASPIRIN 81 MG PO CHEW
81.0000 mg | CHEWABLE_TABLET | ORAL | Status: AC
  Administered 2014-01-10: 81 mg via ORAL

## 2014-01-10 MED ORDER — SODIUM CHLORIDE 0.9 % IJ SOLN
3.0000 mL | Freq: Two times a day (BID) | INTRAMUSCULAR | Status: DC
Start: 2014-01-10 — End: 2014-01-10

## 2014-01-10 MED ORDER — NITROGLYCERIN 1 MG/10 ML FOR IR/CATH LAB
INTRA_ARTERIAL | Status: AC
  Filled 2014-01-10: qty 10

## 2014-01-10 MED ORDER — LIDOCAINE HCL (PF) 1 % IJ SOLN
INTRAMUSCULAR | Status: AC
  Filled 2014-01-10: qty 30

## 2014-01-10 MED ORDER — MIDAZOLAM HCL 2 MG/2ML IJ SOLN
INTRAMUSCULAR | Status: AC
  Filled 2014-01-10: qty 2

## 2014-01-10 MED ORDER — SODIUM CHLORIDE 0.9 % IV SOLN: 250.0000 mL | INTRAVENOUS | Status: DC | PRN

## 2014-01-10 MED ORDER — SODIUM CHLORIDE 0.9 % IV SOLN
INTRAVENOUS | Status: DC
  Administered 2014-01-10: 06:00:00 via INTRAVENOUS

## 2014-01-10 NOTE — CV Procedure (Signed)
Cardiac Catheterization Report   Shannon Moore    69 y.o.  female  DOB: March 23, 1945  MRN: [id number]  01/10/2014    PROCEDURE:  Left heart catheterization with selective coronary angiography, left ventriculogram.  INDICATIONS:  Previously abnormal stress test, worsening exertional chest pain, multiple cardiovascular risk factors  The risks, benefits, and details of the procedure were explained to the patient.  The patient verbalized understanding and wanted to proceed.  Informed written consent was obtained.  PROCEDURE TECHNIQUE:  After Xylocaine anesthesia a 73F sheath was placed in the right femoral artery with a single anterior needle wall stick.   Left coronary angiography was done using a Judkins L4 guide catheter.  Right coronary angiography was done using a Judkins R4 guide catheter.  A 25 cc ventriculogram was performed in the 30 degree RAO projection.  Tolerated the procedure well.  Sheath removed in the holding area.   ESTIMATED BLOOD LOSS:    minimal    CONTRAST:  Total of 70 cc.  COMPLICATIONS:  None.    HEMODYNAMICS:  Aortic postcontrast 124/58, LV postcontrast 124/2-5.  There was no gradient between the left ventricle and aorta.    ANGIOGRAPHIC DATA:    CORONARY ARTERIES:   Arise and distribute normally.  Codominant. No coronary calcification is noted.  There is mild calcification noted in the aortic knob.  Left main coronary artery: Normal.  Left anterior descending: Scattered irregularities but no significant obstructive disease noted.  Circumflex coronary artery: Codominant vessel with a large first marginal branch and 3 posterolateral branches that contain no significant stenosis.  Right coronary artery: Calcified at its ostium, but scattered irregularities with no significant stenoses.  LEFT VENTRICULOGRAM:  Performed in the 30 RAO projection.  The aortic and mitral valves are normal. The left ventricle is normal in size  with normal wall motion. Estimated ejection fraction is 65%.   IMPRESSIONS:  1. No significant obstructive coronary artery disease, mild calcification noted in the ostium of the right coronary artery 2. Normal left ventricle.Marland Kitchen  RECOMMENDATION:  A wish for other sources of chest pain.  Event monitor for palpitations, check echocardiogram to evaluate valves and LV wall thickness.  Kerry Hough MD [facility]

## 2014-01-10 NOTE — Interval H&P Note (Signed)
History and Physical Interval Note:  01/10/2014 7:40 AM     Patient seen and examined.  No interval change in history and exam since last note.  Stable for procedure.  Kerry Hough. MD [facility]  01/10/2014

## 2014-01-10 NOTE — Progress Notes (Signed)
Site area: Right groin a 5 french arterial sheath removed  Site Prior to Removal:  Level 0  Pressure Applied For 20 MINUTES    Minutes Beginning at 0825  Manual:   Yes.    Patient Status During Pull:  stable  Post Pull Groin Site:  Level 0  Post Pull Instructions Given:  Yes.    Post Pull Pulses Present:  Yes.    Dressing Applied:  Yes.    Comments:  VS remain stable .  Pt denies any discomfort at this time.

## 2014-01-10 NOTE — Discharge Instructions (Addendum)
Heart Catheterization Home Care    A catheter was placed through the blood vessel in your groin, contrast was injected into the vessels, and pictures were taken.   You may feel some discomfort at the insertion site after the local anesthetic wears off. This discomfort should gradually improve over the next several days.  Only take over-the-counter or prescription medicines for pain, discomfort, or fever as directed by your caregiver.   Complications are very uncommon after this procedure.Call my office if you develop any of the following symptoms:   Worsening pain.   Bleeding.   Severe swelling at the puncture site.   Lightheadedness.   Dizziness or fainting.   Fever or chills.   If oozing, bleeding, or a lump appears at the puncture site, apply firm pressure directly to the site steadily for 15 minutes and go to the emergency department.   Keep the skin around the insertion site dry. You may take showers after 24 hours. If the area does get wet, dry the skin completely. Avoid baths until the skin puncture site heals, usually 5 to 7 days.   Rest  for the remainder of the day and avoid any heavy lifting (more than 10 pounds or 4.5 kg). Do not operate heavy machinery, drive, or make legal decisions for the first 24 hours after the procedure. Have a responsible person drive you home.   You may resume your usual diet after the procedure. Avoid alcoholic beverages for 24 hours after the procedure.     Angiogram, Care After Refer to this sheet in the next few weeks. These instructions provide you with information on caring for yourself after your procedure. Your health care provider may also give you more specific instructions. Your treatment has been planned according to current medical practices, but problems sometimes occur. Call your health care provider if you have any problems or questions after your procedure.  WHAT TO EXPECT AFTER THE PROCEDURE After your procedure, it is  typical to have the following sensations:  Minor discomfort or tenderness and a small bump at the catheter insertion site. The bump should usually decrease in size and tenderness within 1 to 2 weeks.  Any bruising will usually fade within 2 to 4 weeks. HOME CARE INSTRUCTIONS   You may need to keep taking blood thinners if they were prescribed for you. Take medicines only as directed by your health care provider.  Do not apply powder or lotion to the site.  Do not take baths, swim, or use a hot tub until your health care provider approves.  You may shower 24 hours after the procedure. Remove the bandage (dressing) and gently wash the site with plain soap and water. Gently pat the site dry.  Inspect the site at least twice daily.  Limit your activity for the first 48 hours. Do not bend, squat, or lift anything over 20 lb (9 kg) or as directed by your health care provider.  Plan to have someone take you home after the procedure. Follow instructions about when you can drive or return to work. SEEK MEDICAL CARE IF:  You get light-headed when standing up.  You have drainage (other than a small amount of blood on the dressing).  You have chills.  You have a fever.  You have redness, warmth, swelling, or pain at the insertion site. SEEK IMMEDIATE MEDICAL CARE IF:   You develop chest pain or shortness of breath, feel faint, or pass out.  You have bleeding, swelling larger than a  walnut, or drainage from the catheter insertion site.  You develop pain, discoloration, coldness, or severe bruising in the leg or arm that held the catheter.  You have heavy bleeding from the site. If this happens, hold pressure on the site and call 911. MAKE SURE YOU:  Understand these instructions.  Will watch your condition.  Will get help right away if you are not doing well or get worse. Document Released: 07/16/2004 Document Revised: 05/14/2013 Document Reviewed: 05/22/2012 [facility] Patient  Information 2015 [place], [place]. This information is not intended to replace advice given to you by your health care provider. Make sure you discuss any questions you have with your health care provider.

## 2014-01-11 DIAGNOSIS — R002 Palpitations: Secondary | ICD-10-CM | POA: Diagnosis not present

## 2014-01-11 DIAGNOSIS — E668 Other obesity: Secondary | ICD-10-CM | POA: Diagnosis not present

## 2014-01-11 DIAGNOSIS — R0600 Dyspnea, unspecified: Secondary | ICD-10-CM | POA: Diagnosis not present

## 2014-01-11 DIAGNOSIS — I10 Essential (primary) hypertension: Secondary | ICD-10-CM | POA: Diagnosis not present

## 2014-01-11 DIAGNOSIS — R9431 Abnormal electrocardiogram [ECG] [EKG]: Secondary | ICD-10-CM | POA: Diagnosis not present

## 2014-01-11 DIAGNOSIS — R0789 Other chest pain: Secondary | ICD-10-CM | POA: Diagnosis not present

## 2014-01-11 DIAGNOSIS — E785 Hyperlipidemia, unspecified: Secondary | ICD-10-CM | POA: Diagnosis not present

## 2014-01-11 DIAGNOSIS — E119 Type 2 diabetes mellitus without complications: Secondary | ICD-10-CM | POA: Diagnosis not present

## 2014-01-21 DIAGNOSIS — R9431 Abnormal electrocardiogram [ECG] [EKG]: Secondary | ICD-10-CM | POA: Diagnosis not present

## 2014-02-05 DIAGNOSIS — R0789 Other chest pain: Secondary | ICD-10-CM | POA: Diagnosis not present

## 2014-02-05 DIAGNOSIS — I10 Essential (primary) hypertension: Secondary | ICD-10-CM | POA: Diagnosis not present

## 2014-02-05 DIAGNOSIS — E785 Hyperlipidemia, unspecified: Secondary | ICD-10-CM | POA: Diagnosis not present

## 2014-02-05 DIAGNOSIS — R9431 Abnormal electrocardiogram [ECG] [EKG]: Secondary | ICD-10-CM | POA: Diagnosis not present

## 2014-02-05 DIAGNOSIS — R0600 Dyspnea, unspecified: Secondary | ICD-10-CM | POA: Diagnosis not present

## 2014-02-05 DIAGNOSIS — E119 Type 2 diabetes mellitus without complications: Secondary | ICD-10-CM | POA: Diagnosis not present

## 2014-02-05 DIAGNOSIS — R002 Palpitations: Secondary | ICD-10-CM | POA: Diagnosis not present

## 2014-02-05 DIAGNOSIS — E668 Other obesity: Secondary | ICD-10-CM | POA: Diagnosis not present

## 2014-03-08 DIAGNOSIS — E119 Type 2 diabetes mellitus without complications: Secondary | ICD-10-CM | POA: Diagnosis not present

## 2014-03-08 DIAGNOSIS — E785 Hyperlipidemia, unspecified: Secondary | ICD-10-CM | POA: Diagnosis not present

## 2014-03-08 DIAGNOSIS — M109 Gout, unspecified: Secondary | ICD-10-CM | POA: Diagnosis not present

## 2014-03-08 DIAGNOSIS — I10 Essential (primary) hypertension: Secondary | ICD-10-CM | POA: Diagnosis not present

## 2014-04-15 DIAGNOSIS — J069 Acute upper respiratory infection, unspecified: Secondary | ICD-10-CM | POA: Diagnosis not present

## 2014-04-21 DIAGNOSIS — R05 Cough: Secondary | ICD-10-CM | POA: Diagnosis not present

## 2014-04-21 DIAGNOSIS — J3089 Other allergic rhinitis: Secondary | ICD-10-CM | POA: Diagnosis not present

## 2014-06-14 DIAGNOSIS — M545 Low back pain: Secondary | ICD-10-CM | POA: Diagnosis not present

## 2014-06-14 DIAGNOSIS — H40003 Preglaucoma, unspecified, bilateral: Secondary | ICD-10-CM | POA: Diagnosis not present

## 2014-06-17 DIAGNOSIS — H40003 Preglaucoma, unspecified, bilateral: Secondary | ICD-10-CM | POA: Diagnosis not present

## 2014-09-10 DIAGNOSIS — E119 Type 2 diabetes mellitus without complications: Secondary | ICD-10-CM | POA: Diagnosis not present

## 2014-09-10 DIAGNOSIS — L989 Disorder of the skin and subcutaneous tissue, unspecified: Secondary | ICD-10-CM | POA: Diagnosis not present

## 2014-09-10 DIAGNOSIS — E785 Hyperlipidemia, unspecified: Secondary | ICD-10-CM | POA: Diagnosis not present

## 2014-09-10 DIAGNOSIS — M109 Gout, unspecified: Secondary | ICD-10-CM | POA: Diagnosis not present

## 2014-09-10 DIAGNOSIS — I10 Essential (primary) hypertension: Secondary | ICD-10-CM | POA: Diagnosis not present

## 2014-09-10 DIAGNOSIS — Z23 Encounter for immunization: Secondary | ICD-10-CM | POA: Diagnosis not present

## 2014-09-10 DIAGNOSIS — Z0000 Encounter for general adult medical examination without abnormal findings: Secondary | ICD-10-CM | POA: Diagnosis not present

## 2014-09-11 DIAGNOSIS — M109 Gout, unspecified: Secondary | ICD-10-CM | POA: Diagnosis not present

## 2015-01-31 DIAGNOSIS — L304 Erythema intertrigo: Secondary | ICD-10-CM | POA: Diagnosis not present

## 2015-01-31 DIAGNOSIS — D2262 Melanocytic nevi of left upper limb, including shoulder: Secondary | ICD-10-CM | POA: Diagnosis not present

## 2015-01-31 DIAGNOSIS — L918 Other hypertrophic disorders of the skin: Secondary | ICD-10-CM | POA: Diagnosis not present

## 2015-01-31 DIAGNOSIS — L738 Other specified follicular disorders: Secondary | ICD-10-CM | POA: Diagnosis not present

## 2015-01-31 DIAGNOSIS — D225 Melanocytic nevi of trunk: Secondary | ICD-10-CM | POA: Diagnosis not present

## 2015-01-31 DIAGNOSIS — L821 Other seborrheic keratosis: Secondary | ICD-10-CM | POA: Diagnosis not present

## 2015-01-31 DIAGNOSIS — L814 Other melanin hyperpigmentation: Secondary | ICD-10-CM | POA: Diagnosis not present

## 2015-01-31 DIAGNOSIS — L853 Xerosis cutis: Secondary | ICD-10-CM | POA: Diagnosis not present

## 2015-03-11 DIAGNOSIS — J309 Allergic rhinitis, unspecified: Secondary | ICD-10-CM | POA: Diagnosis not present

## 2015-03-11 DIAGNOSIS — Z7984 Long term (current) use of oral hypoglycemic drugs: Secondary | ICD-10-CM | POA: Diagnosis not present

## 2015-03-11 DIAGNOSIS — M109 Gout, unspecified: Secondary | ICD-10-CM | POA: Diagnosis not present

## 2015-03-11 DIAGNOSIS — E119 Type 2 diabetes mellitus without complications: Secondary | ICD-10-CM | POA: Diagnosis not present

## 2015-03-11 DIAGNOSIS — E785 Hyperlipidemia, unspecified: Secondary | ICD-10-CM | POA: Diagnosis not present

## 2015-03-11 DIAGNOSIS — J0100 Acute maxillary sinusitis, unspecified: Secondary | ICD-10-CM | POA: Diagnosis not present

## 2015-03-11 DIAGNOSIS — I10 Essential (primary) hypertension: Secondary | ICD-10-CM | POA: Diagnosis not present

## 2015-08-23 DIAGNOSIS — J209 Acute bronchitis, unspecified: Secondary | ICD-10-CM | POA: Diagnosis not present

## 2015-08-23 DIAGNOSIS — R0602 Shortness of breath: Secondary | ICD-10-CM | POA: Diagnosis not present

## 2015-08-23 DIAGNOSIS — I10 Essential (primary) hypertension: Secondary | ICD-10-CM | POA: Diagnosis not present

## 2015-08-23 DIAGNOSIS — R05 Cough: Secondary | ICD-10-CM | POA: Diagnosis not present

## 2015-09-23 DIAGNOSIS — E119 Type 2 diabetes mellitus without complications: Secondary | ICD-10-CM | POA: Diagnosis not present

## 2015-09-23 DIAGNOSIS — Z0000 Encounter for general adult medical examination without abnormal findings: Secondary | ICD-10-CM | POA: Diagnosis not present

## 2015-09-23 DIAGNOSIS — M109 Gout, unspecified: Secondary | ICD-10-CM | POA: Diagnosis not present

## 2015-09-23 DIAGNOSIS — E785 Hyperlipidemia, unspecified: Secondary | ICD-10-CM | POA: Diagnosis not present

## 2015-09-23 DIAGNOSIS — Z23 Encounter for immunization: Secondary | ICD-10-CM | POA: Diagnosis not present

## 2015-09-23 DIAGNOSIS — I10 Essential (primary) hypertension: Secondary | ICD-10-CM | POA: Diagnosis not present

## 2015-09-23 DIAGNOSIS — H43393 Other vitreous opacities, bilateral: Secondary | ICD-10-CM | POA: Diagnosis not present

## 2015-09-23 DIAGNOSIS — Z1211 Encounter for screening for malignant neoplasm of colon: Secondary | ICD-10-CM | POA: Diagnosis not present

## 2015-10-01 DIAGNOSIS — H43393 Other vitreous opacities, bilateral: Secondary | ICD-10-CM | POA: Diagnosis not present

## 2015-11-24 DIAGNOSIS — D123 Benign neoplasm of transverse colon: Secondary | ICD-10-CM | POA: Diagnosis not present

## 2015-11-24 DIAGNOSIS — D126 Benign neoplasm of colon, unspecified: Secondary | ICD-10-CM | POA: Diagnosis not present

## 2015-11-24 DIAGNOSIS — Z1211 Encounter for screening for malignant neoplasm of colon: Secondary | ICD-10-CM | POA: Diagnosis not present

## 2015-11-24 DIAGNOSIS — K5730 Diverticulosis of large intestine without perforation or abscess without bleeding: Secondary | ICD-10-CM | POA: Diagnosis not present

## 2015-11-27 DIAGNOSIS — D126 Benign neoplasm of colon, unspecified: Secondary | ICD-10-CM | POA: Diagnosis not present

## 2015-11-27 DIAGNOSIS — Z1211 Encounter for screening for malignant neoplasm of colon: Secondary | ICD-10-CM | POA: Diagnosis not present

## 2015-12-26 DIAGNOSIS — Z471 Aftercare following joint replacement surgery: Secondary | ICD-10-CM | POA: Diagnosis not present

## 2015-12-26 DIAGNOSIS — Z96652 Presence of left artificial knee joint: Secondary | ICD-10-CM | POA: Diagnosis not present

## 2015-12-26 DIAGNOSIS — M25561 Pain in right knee: Secondary | ICD-10-CM | POA: Diagnosis not present

## 2015-12-26 DIAGNOSIS — M1711 Unilateral primary osteoarthritis, right knee: Secondary | ICD-10-CM | POA: Diagnosis not present

## 2016-01-07 DIAGNOSIS — J019 Acute sinusitis, unspecified: Secondary | ICD-10-CM | POA: Diagnosis not present

## 2016-03-01 DIAGNOSIS — H40013 Open angle with borderline findings, low risk, bilateral: Secondary | ICD-10-CM | POA: Diagnosis not present

## 2016-03-01 DIAGNOSIS — H16223 Keratoconjunctivitis sicca, not specified as Sjogren's, bilateral: Secondary | ICD-10-CM | POA: Diagnosis not present

## 2016-03-01 DIAGNOSIS — H2513 Age-related nuclear cataract, bilateral: Secondary | ICD-10-CM | POA: Diagnosis not present

## 2016-03-01 DIAGNOSIS — H43811 Vitreous degeneration, right eye: Secondary | ICD-10-CM | POA: Diagnosis not present

## 2016-03-01 DIAGNOSIS — E119 Type 2 diabetes mellitus without complications: Secondary | ICD-10-CM | POA: Diagnosis not present

## 2016-03-11 DIAGNOSIS — M1711 Unilateral primary osteoarthritis, right knee: Secondary | ICD-10-CM | POA: Diagnosis not present

## 2016-04-16 ENCOUNTER — Ambulatory Visit
Admission: RE | Admit: 2016-04-16 | Discharge: 2016-04-16 | Disposition: A | Discharge status: Home / Self Care | Payer: Medicare HMO | Source: Ambulatory Visit | Attending: Family Medicine | Admitting: Family Medicine

## 2016-04-16 ENCOUNTER — Other Ambulatory Visit: Payer: Self-pay | Admitting: Family Medicine

## 2016-04-16 DIAGNOSIS — M25512 Pain in left shoulder: Secondary | ICD-10-CM

## 2016-04-16 DIAGNOSIS — M542 Cervicalgia: Secondary | ICD-10-CM | POA: Diagnosis not present

## 2016-04-16 DIAGNOSIS — S4992XA Unspecified injury of left shoulder and upper arm, initial encounter: Secondary | ICD-10-CM | POA: Diagnosis not present

## 2016-04-16 DIAGNOSIS — Z9181 History of falling: Secondary | ICD-10-CM | POA: Diagnosis not present

## 2016-04-27 DIAGNOSIS — M25312 Other instability, left shoulder: Secondary | ICD-10-CM | POA: Diagnosis not present

## 2016-04-27 DIAGNOSIS — M25512 Pain in left shoulder: Secondary | ICD-10-CM | POA: Diagnosis not present

## 2016-04-27 DIAGNOSIS — M25612 Stiffness of left shoulder, not elsewhere classified: Secondary | ICD-10-CM | POA: Diagnosis not present

## 2016-04-28 DIAGNOSIS — M25512 Pain in left shoulder: Secondary | ICD-10-CM | POA: Diagnosis not present

## 2016-04-28 DIAGNOSIS — M25612 Stiffness of left shoulder, not elsewhere classified: Secondary | ICD-10-CM | POA: Diagnosis not present

## 2016-04-28 DIAGNOSIS — M25312 Other instability, left shoulder: Secondary | ICD-10-CM | POA: Diagnosis not present

## 2016-04-30 DIAGNOSIS — M25612 Stiffness of left shoulder, not elsewhere classified: Secondary | ICD-10-CM | POA: Diagnosis not present

## 2016-04-30 DIAGNOSIS — M25512 Pain in left shoulder: Secondary | ICD-10-CM | POA: Diagnosis not present

## 2016-04-30 DIAGNOSIS — M25312 Other instability, left shoulder: Secondary | ICD-10-CM | POA: Diagnosis not present

## 2016-05-04 DIAGNOSIS — M25612 Stiffness of left shoulder, not elsewhere classified: Secondary | ICD-10-CM | POA: Diagnosis not present

## 2016-05-04 DIAGNOSIS — E119 Type 2 diabetes mellitus without complications: Secondary | ICD-10-CM | POA: Diagnosis not present

## 2016-05-04 DIAGNOSIS — I10 Essential (primary) hypertension: Secondary | ICD-10-CM | POA: Diagnosis not present

## 2016-05-04 DIAGNOSIS — E785 Hyperlipidemia, unspecified: Secondary | ICD-10-CM | POA: Diagnosis not present

## 2016-05-04 DIAGNOSIS — M25511 Pain in right shoulder: Secondary | ICD-10-CM | POA: Diagnosis not present

## 2016-05-04 DIAGNOSIS — M25312 Other instability, left shoulder: Secondary | ICD-10-CM | POA: Diagnosis not present

## 2016-05-04 DIAGNOSIS — M25512 Pain in left shoulder: Secondary | ICD-10-CM | POA: Diagnosis not present

## 2016-05-06 DIAGNOSIS — M25612 Stiffness of left shoulder, not elsewhere classified: Secondary | ICD-10-CM | POA: Diagnosis not present

## 2016-05-06 DIAGNOSIS — M25312 Other instability, left shoulder: Secondary | ICD-10-CM | POA: Diagnosis not present

## 2016-05-06 DIAGNOSIS — M25512 Pain in left shoulder: Secondary | ICD-10-CM | POA: Diagnosis not present

## 2016-05-07 DIAGNOSIS — M25312 Other instability, left shoulder: Secondary | ICD-10-CM | POA: Diagnosis not present

## 2016-05-07 DIAGNOSIS — M25612 Stiffness of left shoulder, not elsewhere classified: Secondary | ICD-10-CM | POA: Diagnosis not present

## 2016-05-07 DIAGNOSIS — M25512 Pain in left shoulder: Secondary | ICD-10-CM | POA: Diagnosis not present

## 2016-05-17 DIAGNOSIS — M25312 Other instability, left shoulder: Secondary | ICD-10-CM | POA: Diagnosis not present

## 2016-05-17 DIAGNOSIS — M25612 Stiffness of left shoulder, not elsewhere classified: Secondary | ICD-10-CM | POA: Diagnosis not present

## 2016-05-17 DIAGNOSIS — M25512 Pain in left shoulder: Secondary | ICD-10-CM | POA: Diagnosis not present

## 2016-05-19 DIAGNOSIS — M25512 Pain in left shoulder: Secondary | ICD-10-CM | POA: Diagnosis not present

## 2016-05-19 DIAGNOSIS — M25312 Other instability, left shoulder: Secondary | ICD-10-CM | POA: Diagnosis not present

## 2016-05-19 DIAGNOSIS — M25612 Stiffness of left shoulder, not elsewhere classified: Secondary | ICD-10-CM | POA: Diagnosis not present

## 2016-05-21 DIAGNOSIS — M25612 Stiffness of left shoulder, not elsewhere classified: Secondary | ICD-10-CM | POA: Diagnosis not present

## 2016-05-21 DIAGNOSIS — M25512 Pain in left shoulder: Secondary | ICD-10-CM | POA: Diagnosis not present

## 2016-05-21 DIAGNOSIS — M25312 Other instability, left shoulder: Secondary | ICD-10-CM | POA: Diagnosis not present

## 2016-05-25 DIAGNOSIS — M25312 Other instability, left shoulder: Secondary | ICD-10-CM | POA: Diagnosis not present

## 2016-05-25 DIAGNOSIS — M25612 Stiffness of left shoulder, not elsewhere classified: Secondary | ICD-10-CM | POA: Diagnosis not present

## 2016-05-25 DIAGNOSIS — M25512 Pain in left shoulder: Secondary | ICD-10-CM | POA: Diagnosis not present

## 2016-05-28 DIAGNOSIS — M25312 Other instability, left shoulder: Secondary | ICD-10-CM | POA: Diagnosis not present

## 2016-05-28 DIAGNOSIS — M25512 Pain in left shoulder: Secondary | ICD-10-CM | POA: Diagnosis not present

## 2016-05-28 DIAGNOSIS — M25612 Stiffness of left shoulder, not elsewhere classified: Secondary | ICD-10-CM | POA: Diagnosis not present

## 2016-05-31 DIAGNOSIS — R69 Illness, unspecified: Secondary | ICD-10-CM | POA: Diagnosis not present

## 2016-06-01 DIAGNOSIS — M25312 Other instability, left shoulder: Secondary | ICD-10-CM | POA: Diagnosis not present

## 2016-06-01 DIAGNOSIS — M25512 Pain in left shoulder: Secondary | ICD-10-CM | POA: Diagnosis not present

## 2016-06-01 DIAGNOSIS — M25612 Stiffness of left shoulder, not elsewhere classified: Secondary | ICD-10-CM | POA: Diagnosis not present

## 2016-06-08 DIAGNOSIS — M25511 Pain in right shoulder: Secondary | ICD-10-CM | POA: Diagnosis not present

## 2016-06-08 DIAGNOSIS — M25312 Other instability, left shoulder: Secondary | ICD-10-CM | POA: Diagnosis not present

## 2016-06-08 DIAGNOSIS — M25512 Pain in left shoulder: Secondary | ICD-10-CM | POA: Diagnosis not present

## 2016-06-08 DIAGNOSIS — M25612 Stiffness of left shoulder, not elsewhere classified: Secondary | ICD-10-CM | POA: Diagnosis not present

## 2016-06-10 ENCOUNTER — Encounter: Payer: Medicare HMO | Attending: Family Medicine | Admitting: *Deleted

## 2016-06-10 DIAGNOSIS — Z713 Dietary counseling and surveillance: Secondary | ICD-10-CM | POA: Diagnosis not present

## 2016-06-10 DIAGNOSIS — E119 Type 2 diabetes mellitus without complications: Secondary | ICD-10-CM | POA: Insufficient documentation

## 2016-06-10 NOTE — Patient Instructions (Signed)
Plan:  Aim for 2-3 Carb Choices per meal (30-45 grams)  Aim for 0-1 Carbs per snack if hungry  Include protein in moderation with your meals and snacks Consider reading food labels for Total Carbohydrate of foods Consider  increasing your activity level by walking with your daughter daily as tolerated Consider checking BG at alternate times per day   Continue taking medication as directed by MD

## 2016-06-10 NOTE — Progress Notes (Signed)
Diabetes Self-Management Education  Visit Type: First/Initial  Appt. Start Time: 0900 Appt. End Time: 1030  06/10/2016  Ms. Shannon Moore, identified by name and date of birth, is a 71 y.o. female with a diagnosis of Diabetes: Type 2. She states she was diagnosed around 2004 and has not had any further education for diabetes since then. She is concerned that her fasting BG's are climbing to 180's and wants to see what she can do about that. Her father died from complications with Diabetes in the 1990's and she wants to avoid complications.  ASSESSMENT  There were no vitals taken for this visit. There is no height or weight on file to calculate BMI.      Diabetes Self-Management Education - 06/10/16 0908      Visit Information   Visit Type First/Initial     Initial Visit   Diabetes Type Type 2   Are you currently following a meal plan? No   Are you taking your medications as prescribed? Yes   Date Diagnosed 2004     Health Coping   How would you rate your overall health? Good     Psychosocial Assessment   Patient Belief/Attitude about Diabetes Other (comment)  accepting   Self-care barriers None   Self-management support Family;Doctor's office   Other persons present Patient   Patient Concerns Nutrition/Meal planning;Glycemic Control   Special Needs None   What is the last grade level you completed in school? 12     Complications   Last HgB A1C per patient/outside source 6.9 %   How often do you check your blood sugar? 1-2 times/day   Fasting Blood glucose range (mg/dL) 130-179;180-200   Number of hypoglycemic episodes per month 0   Have you had a dilated eye exam in the past 12 months? Yes   Have you had a dental exam in the past 12 months? Yes   Are you checking your feet? Yes   How many days per week are you checking your feet? 5     Dietary Intake   Breakfast PNB toast on 40 calorie bread OR raisin bran with added fruit with whole milk   Snack (morning) no   Lunch occasionally roast beef sandwich OR left overs    Snack (afternoon) occasionally chips, nuts or popcorn   Dinner meat, starch and vegetables OR casserole   Snack (evening) occasionally a scoop of ice cream OR popcorn OR chips if they are there   Beverage(s) coffee with SF flavoring syrup, water, wine on the weekends     Exercise   Exercise Type Light (walking / raking leaves)   How many days per week to you exercise? 2   How many minutes per day do you exercise? 45   Total minutes per week of exercise 90     Patient Education   Previous Diabetes Education Yes (please comment)  2004   Nutrition management  Role of diet in the treatment of diabetes and the relationship between the three main macronutrients and blood glucose level;Food label reading, portion sizes and measuring food.;Carbohydrate counting;Information on hints to eating out and maintain blood glucose control.   Physical activity and exercise  Role of exercise on diabetes management, blood pressure control and cardiac health.;Helped patient identify appropriate exercises in relation to his/her diabetes, diabetes complications and other health issue.   Monitoring Identified appropriate SMBG and/or A1C goals.   Chronic complications Relationship between chronic complications and blood glucose control     Individualized Goals (  developed by patient)   Nutrition Follow meal plan discussed;General guidelines for healthy choices and portions discussed   Physical Activity Exercise 3-5 times per week   Medications take my medication as prescribed   Monitoring  test my blood glucose as discussed     Outcomes   Expected Outcomes Demonstrated interest in learning. Expect positive outcomes   Future DMSE PRN   Program Status Completed      Individualized Plan for Diabetes Self-Management Training:   Learning Objective:  Patient will have a greater understanding of diabetes self-management. Patient education plan is to attend  individual and/or group sessions per assessed needs and concerns.   Plan:  Patient Instructions  Plan:  Aim for 2-3 Carb Choices per meal (30-45 grams)  Aim for 0-1 Carbs per snack if hungry  Include protein in moderation with your meals and snacks Consider reading food labels for Total Carbohydrate of foods Consider  increasing your activity level by walking with your daughter daily as tolerated Consider checking BG at alternate times per day   Continue taking medication as directed by MD  Expected Outcomes:  Demonstrated interest in learning. Expect positive outcomes  Education material provided: Food label handouts, A1C conversion sheet, Meal plan card, Snack sheet and Carbohydrate counting sheet, High Fiber Food List  If problems or questions, patient to contact team via:  Phone  Future DSME appointment: PRN

## 2016-06-11 DIAGNOSIS — M25511 Pain in right shoulder: Secondary | ICD-10-CM | POA: Diagnosis not present

## 2016-06-11 DIAGNOSIS — M25512 Pain in left shoulder: Secondary | ICD-10-CM | POA: Diagnosis not present

## 2016-06-11 DIAGNOSIS — M25612 Stiffness of left shoulder, not elsewhere classified: Secondary | ICD-10-CM | POA: Diagnosis not present

## 2016-06-11 DIAGNOSIS — M25312 Other instability, left shoulder: Secondary | ICD-10-CM | POA: Diagnosis not present

## 2016-06-15 DIAGNOSIS — M25512 Pain in left shoulder: Secondary | ICD-10-CM | POA: Diagnosis not present

## 2016-06-15 DIAGNOSIS — M25312 Other instability, left shoulder: Secondary | ICD-10-CM | POA: Diagnosis not present

## 2016-06-15 DIAGNOSIS — M25612 Stiffness of left shoulder, not elsewhere classified: Secondary | ICD-10-CM | POA: Diagnosis not present

## 2016-06-15 DIAGNOSIS — M25511 Pain in right shoulder: Secondary | ICD-10-CM | POA: Diagnosis not present

## 2016-06-17 DIAGNOSIS — M25612 Stiffness of left shoulder, not elsewhere classified: Secondary | ICD-10-CM | POA: Diagnosis not present

## 2016-06-17 DIAGNOSIS — M25511 Pain in right shoulder: Secondary | ICD-10-CM | POA: Diagnosis not present

## 2016-06-17 DIAGNOSIS — M25312 Other instability, left shoulder: Secondary | ICD-10-CM | POA: Diagnosis not present

## 2016-06-17 DIAGNOSIS — M25512 Pain in left shoulder: Secondary | ICD-10-CM | POA: Diagnosis not present

## 2016-06-22 DIAGNOSIS — M25512 Pain in left shoulder: Secondary | ICD-10-CM | POA: Diagnosis not present

## 2016-06-22 DIAGNOSIS — M25612 Stiffness of left shoulder, not elsewhere classified: Secondary | ICD-10-CM | POA: Diagnosis not present

## 2016-06-22 DIAGNOSIS — M25312 Other instability, left shoulder: Secondary | ICD-10-CM | POA: Diagnosis not present

## 2016-06-22 DIAGNOSIS — M25511 Pain in right shoulder: Secondary | ICD-10-CM | POA: Diagnosis not present

## 2016-06-24 DIAGNOSIS — M25512 Pain in left shoulder: Secondary | ICD-10-CM | POA: Diagnosis not present

## 2016-06-24 DIAGNOSIS — M25612 Stiffness of left shoulder, not elsewhere classified: Secondary | ICD-10-CM | POA: Diagnosis not present

## 2016-06-24 DIAGNOSIS — M25312 Other instability, left shoulder: Secondary | ICD-10-CM | POA: Diagnosis not present

## 2016-06-24 DIAGNOSIS — M25511 Pain in right shoulder: Secondary | ICD-10-CM | POA: Diagnosis not present

## 2016-07-05 DIAGNOSIS — M25612 Stiffness of left shoulder, not elsewhere classified: Secondary | ICD-10-CM | POA: Diagnosis not present

## 2016-07-05 DIAGNOSIS — M25512 Pain in left shoulder: Secondary | ICD-10-CM | POA: Diagnosis not present

## 2016-07-05 DIAGNOSIS — M25312 Other instability, left shoulder: Secondary | ICD-10-CM | POA: Diagnosis not present

## 2016-07-05 DIAGNOSIS — M25511 Pain in right shoulder: Secondary | ICD-10-CM | POA: Diagnosis not present

## 2016-07-07 DIAGNOSIS — M25512 Pain in left shoulder: Secondary | ICD-10-CM | POA: Diagnosis not present

## 2016-07-07 DIAGNOSIS — M25312 Other instability, left shoulder: Secondary | ICD-10-CM | POA: Diagnosis not present

## 2016-07-07 DIAGNOSIS — M25612 Stiffness of left shoulder, not elsewhere classified: Secondary | ICD-10-CM | POA: Diagnosis not present

## 2016-07-07 DIAGNOSIS — M25511 Pain in right shoulder: Secondary | ICD-10-CM | POA: Diagnosis not present

## 2016-07-12 DIAGNOSIS — M25312 Other instability, left shoulder: Secondary | ICD-10-CM | POA: Diagnosis not present

## 2016-07-12 DIAGNOSIS — M25612 Stiffness of left shoulder, not elsewhere classified: Secondary | ICD-10-CM | POA: Diagnosis not present

## 2016-07-12 DIAGNOSIS — M25511 Pain in right shoulder: Secondary | ICD-10-CM | POA: Diagnosis not present

## 2016-07-12 DIAGNOSIS — M25512 Pain in left shoulder: Secondary | ICD-10-CM | POA: Diagnosis not present

## 2016-07-14 DIAGNOSIS — J020 Streptococcal pharyngitis: Secondary | ICD-10-CM | POA: Diagnosis not present

## 2016-07-19 DIAGNOSIS — M25312 Other instability, left shoulder: Secondary | ICD-10-CM | POA: Diagnosis not present

## 2016-07-19 DIAGNOSIS — M25511 Pain in right shoulder: Secondary | ICD-10-CM | POA: Diagnosis not present

## 2016-07-19 DIAGNOSIS — M25512 Pain in left shoulder: Secondary | ICD-10-CM | POA: Diagnosis not present

## 2016-07-19 DIAGNOSIS — M25612 Stiffness of left shoulder, not elsewhere classified: Secondary | ICD-10-CM | POA: Diagnosis not present

## 2016-07-23 DIAGNOSIS — M25312 Other instability, left shoulder: Secondary | ICD-10-CM | POA: Diagnosis not present

## 2016-07-23 DIAGNOSIS — M25612 Stiffness of left shoulder, not elsewhere classified: Secondary | ICD-10-CM | POA: Diagnosis not present

## 2016-07-23 DIAGNOSIS — M25512 Pain in left shoulder: Secondary | ICD-10-CM | POA: Diagnosis not present

## 2016-07-23 DIAGNOSIS — M25511 Pain in right shoulder: Secondary | ICD-10-CM | POA: Diagnosis not present

## 2016-11-29 DIAGNOSIS — M1711 Unilateral primary osteoarthritis, right knee: Secondary | ICD-10-CM | POA: Diagnosis not present

## 2016-11-29 DIAGNOSIS — M25561 Pain in right knee: Secondary | ICD-10-CM | POA: Diagnosis not present

## 2016-12-06 DIAGNOSIS — R69 Illness, unspecified: Secondary | ICD-10-CM | POA: Diagnosis not present

## 2016-12-24 DIAGNOSIS — Z79899 Other long term (current) drug therapy: Secondary | ICD-10-CM | POA: Diagnosis not present

## 2016-12-24 DIAGNOSIS — M109 Gout, unspecified: Secondary | ICD-10-CM | POA: Diagnosis not present

## 2016-12-24 DIAGNOSIS — E119 Type 2 diabetes mellitus without complications: Secondary | ICD-10-CM | POA: Diagnosis not present

## 2016-12-24 DIAGNOSIS — Z0000 Encounter for general adult medical examination without abnormal findings: Secondary | ICD-10-CM | POA: Diagnosis not present

## 2016-12-24 DIAGNOSIS — R252 Cramp and spasm: Secondary | ICD-10-CM | POA: Diagnosis not present

## 2016-12-24 DIAGNOSIS — M549 Dorsalgia, unspecified: Secondary | ICD-10-CM | POA: Diagnosis not present

## 2016-12-24 DIAGNOSIS — E785 Hyperlipidemia, unspecified: Secondary | ICD-10-CM | POA: Diagnosis not present

## 2016-12-24 DIAGNOSIS — I10 Essential (primary) hypertension: Secondary | ICD-10-CM | POA: Diagnosis not present

## 2016-12-24 DIAGNOSIS — Z23 Encounter for immunization: Secondary | ICD-10-CM | POA: Diagnosis not present

## 2017-03-09 DIAGNOSIS — E119 Type 2 diabetes mellitus without complications: Secondary | ICD-10-CM | POA: Diagnosis not present

## 2017-03-09 DIAGNOSIS — H1789 Other corneal scars and opacities: Secondary | ICD-10-CM | POA: Diagnosis not present

## 2017-03-09 DIAGNOSIS — H5203 Hypermetropia, bilateral: Secondary | ICD-10-CM | POA: Diagnosis not present

## 2017-03-09 DIAGNOSIS — H40013 Open angle with borderline findings, low risk, bilateral: Secondary | ICD-10-CM | POA: Diagnosis not present

## 2017-04-05 ENCOUNTER — Ambulatory Visit: Payer: Medicare HMO | Admitting: Podiatry

## 2017-04-05 ENCOUNTER — Encounter: Payer: Self-pay | Admitting: Podiatry

## 2017-04-05 DIAGNOSIS — L600 Ingrowing nail: Secondary | ICD-10-CM | POA: Diagnosis not present

## 2017-04-05 MED ORDER — NEOMYCIN-POLYMYXIN-HC 1 % OT SOLN: OTIC | 1 refills | Status: DC

## 2017-04-05 NOTE — Patient Instructions (Signed)

## 2017-04-06 ENCOUNTER — Encounter: Payer: Self-pay | Admitting: Podiatry

## 2017-04-06 NOTE — Progress Notes (Signed)
Subjective:  Patient ID: Shannon Moore, female    DOB: [birth year],  MRN: [id number] HPI Chief Complaint  Patient presents with  . Toe Pain    Hallux right - lateral border, red, swollen, tender x 1 month-was really infected, better but still hurts, shoes aggravate  . New Patient (Initial Visit)    72 y.o. female presents with the above complaint.   ROS: She denies fever chills nausea vomiting muscle aches pains shortness of breath chest pain headache.  Past Medical History:  Diagnosis Date  . Essential hypertension 01/08/2014  . GERD (gastroesophageal reflux disease)   . Gout   . Hyperlipidemia 01/08/2014  . Obesity (BMI 30-39.9) 01/08/2014  . Type 2 diabetes mellitus without complication ([place]) 95/18/8416   Past Surgical History:  Procedure Laterality Date  . DIAGNOSTIC LAPAROSCOPY    . EYE SURGERY     bilateral  RK 10 yrs ago  . left and right knee arthroscopies- left 6 months ago    . LEFT HEART CATHETERIZATION WITH CORONARY ANGIOGRAM N/A 01/10/2014   Procedure: LEFT HEART CATHETERIZATION WITH CORONARY ANGIOGRAM;  Surgeon: Jacolyn Reedy, MD;  Location: [facility] CATH LAB;  Service: Cardiovascular;  Laterality: N/A;  . removal of bladder tumor    . TOTAL KNEE ARTHROPLASTY  11/16/2010   Procedure: TOTAL KNEE ARTHROPLASTY;  Surgeon: Mauri Pole;  Location: [facility] ORS;  Service: Orthopedics;  Laterality: Left;  . TUBAL LIGATION      Current Outpatient Medications:  .  Ascorbic Acid (VITAMIN C PO), Take by mouth., Disp: , Rfl:  .  BIOTIN PO, Take by mouth., Disp: , Rfl:  .  GLUCOSAMINE-CHONDROITIN PO, Take by mouth., Disp: , Rfl:  .  Multiple Vitamin (MULTIVITAMIN) capsule, Take 1 capsule by mouth daily., Disp: , Rfl:  .  Omega-3 Fatty Acids (FISH OIL PO), Take by mouth., Disp: , Rfl:  .  allopurinol (ZYLOPRIM) 300 MG tablet, Take 300 mg by mouth at bedtime. , Disp: , Rfl:  .  aspirin EC 81 MG tablet, Take 81 mg by mouth daily., Disp: , Rfl:  .  cetirizine (ZYRTEC) 10 MG tablet,  Take 10 mg by mouth daily. , Disp: , Rfl:  .  fluticasone (FLONASE) 50 MCG/ACT nasal spray, Place 1-2 sprays into the nose daily as needed for allergies. , Disp: , Rfl:  .  metFORMIN (GLUCOPHAGE-XR) 500 MG 24 hr tablet, Take 1,000 mg by mouth daily with supper. , Disp: , Rfl:  .  metoprolol succinate (TOPROL-XL) 25 MG 24 hr tablet, , Disp: , Rfl:  .  NEOMYCIN-POLYMYXIN-HYDROCORTISONE (CORTISPORIN) 1 % SOLN OTIC solution, Apply 1-2 drops to toe BID after soaking, Disp: 10 mL, Rfl: 1 .  olmesartan-hydrochlorothiazide (BENICAR HCT) 20-12.5 MG per tablet, Take 1 tablet by mouth at bedtime. , Disp: , Rfl:  .  pravastatin (PRAVACHOL) 80 MG tablet, Take 80 mg by mouth at bedtime. , Disp: , Rfl:   Allergies  Allergen Reactions  . Atorvastatin Other (See Comments)    Intolerance   . Sertraline     Congestive heart failure.  . Sulfa Antibiotics Itching  . Sulfasalazine Itching  . Penicillins Rash   Review of Systems Objective:  There were no vitals filed for this visit.  General: Well developed, nourished, in no acute distress, alert and oriented x3   Dermatological: Skin is warm, dry and supple bilateral. Nails x 10 are well maintained; remaining integument appears unremarkable at this time. There are no open sores, no preulcerative lesions, no rash or  signs of infection present.  She has an ingrown toenail fibular border hallux right sharply incurvated tender on palpation no purulence.  Vascular: Dorsalis Pedis artery and Posterior Tibial artery pedal pulses are 2/4 bilateral with immedate capillary fill time. Pedal hair growth present. No varicosities and no lower extremity edema present bilateral.   Neruologic: Grossly intact via light touch bilateral. Vibratory intact via tuning fork bilateral. Protective threshold with Semmes Wienstein monofilament intact to all pedal sites bilateral. Patellar and Achilles deep tendon reflexes 2+ bilateral. No Babinski or clonus noted bilateral.    Musculoskeletal: No gross boney pedal deformities bilateral. No pain, crepitus, or limitation noted with foot and ankle range of motion bilateral. Muscular strength 5/5 in all groups tested bilateral.  Gait: Unassisted, Nonantalgic.    Radiographs:  None taken  Assessment & Plan:   Assessment: Ingrown toenail paronychia abscess hallux right fibular border.  Plan: We discussed the etiology pathology conservative versus surgical therapies.  At this point we decided to perform a chemical matrixectomy which was performed after 3 cc of a 50-50 mixture of Marcaine plain and lidocaine plain was infiltrated in a hallux block right foot.  She tolerated this well.  The toe was then prepped and draped in his normal sterile fashion.  The lateral nail border was split from distal to proximal avulsed and 3 applications of phenol were applied to the fibular border.  It was neutralized with isopropyl alcohol Silvadene cream and a Telfa pad was applied.  She was given both oral and written home-going instructions for care and soaking of her toes as well as prescription for Cortisporin Otic.     Anguel Delapena T. [place], [place]

## 2017-04-19 ENCOUNTER — Ambulatory Visit: Payer: Medicare HMO | Admitting: Podiatry

## 2017-04-19 ENCOUNTER — Encounter: Payer: Self-pay | Admitting: Podiatry

## 2017-04-19 DIAGNOSIS — L600 Ingrowing nail: Secondary | ICD-10-CM

## 2017-04-19 NOTE — Patient Instructions (Signed)

## 2017-04-20 DIAGNOSIS — Z1231 Encounter for screening mammogram for malignant neoplasm of breast: Secondary | ICD-10-CM | POA: Diagnosis not present

## 2017-04-20 NOTE — Progress Notes (Signed)
She presents today for follow-up of her matrixectomy hallux right she says is doing great.  She is complaining of a small painful area to the lateral aspect of her right foot thinks that she may have done it while she was in the garden.  Objective: Vital signs are stable she is alert and oriented x3.  Pulses are palpable.  Matrixectomy site is gone on to heal uneventfully there is no signs of infection.  She does have some fissuring to the lateral heel which is painful.  More than likely this is the culprit.  I see no signs of infection.  Assessment: Skin fissuring right heel.  Well-healed matrixectomy.  Plan: Discussed the need for moisturizing the heel and gave her some examples of medications to use discontinue soaking the toe follow-up with me as needed.

## 2017-04-28 DIAGNOSIS — M1711 Unilateral primary osteoarthritis, right knee: Secondary | ICD-10-CM | POA: Diagnosis not present

## 2017-04-28 DIAGNOSIS — M25561 Pain in right knee: Secondary | ICD-10-CM | POA: Diagnosis not present

## 2017-06-22 ENCOUNTER — Encounter (HOSPITAL_COMMUNITY): Payer: Self-pay | Admitting: Emergency Medicine

## 2017-06-22 ENCOUNTER — Emergency Department (HOSPITAL_COMMUNITY)
Admission: EM | Admit: 2017-06-22 | Discharge: 2017-06-22 | Disposition: A | Discharge status: Home / Self Care | Payer: Medicare HMO | Attending: Emergency Medicine | Admitting: Emergency Medicine

## 2017-06-22 ENCOUNTER — Emergency Department (HOSPITAL_COMMUNITY): Payer: Medicare HMO

## 2017-06-22 DIAGNOSIS — E785 Hyperlipidemia, unspecified: Secondary | ICD-10-CM | POA: Diagnosis not present

## 2017-06-22 DIAGNOSIS — Y998 Other external cause status: Secondary | ICD-10-CM | POA: Diagnosis not present

## 2017-06-22 DIAGNOSIS — I110 Hypertensive heart disease with heart failure: Secondary | ICD-10-CM | POA: Insufficient documentation

## 2017-06-22 DIAGNOSIS — W0110XA Fall on same level from slipping, tripping and stumbling with subsequent striking against unspecified object, initial encounter: Secondary | ICD-10-CM | POA: Diagnosis not present

## 2017-06-22 DIAGNOSIS — S022XXA Fracture of nasal bones, initial encounter for closed fracture: Secondary | ICD-10-CM | POA: Insufficient documentation

## 2017-06-22 DIAGNOSIS — I509 Heart failure, unspecified: Secondary | ICD-10-CM | POA: Diagnosis not present

## 2017-06-22 DIAGNOSIS — Y939 Activity, unspecified: Secondary | ICD-10-CM | POA: Insufficient documentation

## 2017-06-22 DIAGNOSIS — Y929 Unspecified place or not applicable: Secondary | ICD-10-CM | POA: Insufficient documentation

## 2017-06-22 DIAGNOSIS — Z87891 Personal history of nicotine dependence: Secondary | ICD-10-CM | POA: Insufficient documentation

## 2017-06-22 DIAGNOSIS — Z7984 Long term (current) use of oral hypoglycemic drugs: Secondary | ICD-10-CM | POA: Insufficient documentation

## 2017-06-22 DIAGNOSIS — S0993XA Unspecified injury of face, initial encounter: Secondary | ICD-10-CM | POA: Diagnosis not present

## 2017-06-22 DIAGNOSIS — S0990XA Unspecified injury of head, initial encounter: Secondary | ICD-10-CM

## 2017-06-22 DIAGNOSIS — S63501A Unspecified sprain of right wrist, initial encounter: Secondary | ICD-10-CM | POA: Diagnosis not present

## 2017-06-22 DIAGNOSIS — Z79899 Other long term (current) drug therapy: Secondary | ICD-10-CM | POA: Diagnosis not present

## 2017-06-22 DIAGNOSIS — S8001XA Contusion of right knee, initial encounter: Secondary | ICD-10-CM | POA: Diagnosis not present

## 2017-06-22 DIAGNOSIS — E119 Type 2 diabetes mellitus without complications: Secondary | ICD-10-CM | POA: Insufficient documentation

## 2017-06-22 DIAGNOSIS — R51 Headache: Secondary | ICD-10-CM | POA: Diagnosis not present

## 2017-06-22 NOTE — ED Triage Notes (Signed)
Pt reports she tripped and fell over a step this AM. Pt hit head, landed on R arm and R leg. Bruising to forehead and R eye, abrasion to bridge, abrasion to R knee. Per pt she sprained her R wrist, splint was applied at [employer] [facility]. Denies LOC, denies thinners. Pt only complaint now is nausea, was told to come here for further eval.

## 2017-06-22 NOTE — ED Provider Notes (Signed)
Patient placed in Quick Look pathway, seen and evaluated   Chief Complaint: Fall   HPI:   72 y.o. who presents for evaluation after mechanical fall that occurred today at 61 AM.  Patient reports she was walking when her flip-flop got caught on a stair, causing her to fall and landed forward on her face.  She denies any preceding chest pain or dizziness.  Patient reports she did not have any LOC.  Patient reports that she was having pain to the nose and pain to the right upper extremity.  Patient went to the orthopedic clinic for evaluation of her right arm.  While there, she started having some nausea they told her to go to the emergency department to get evaluated for possible intracranial abnormality.  Patient reports that since then, she has not had any vomiting, vision changes, numbness/weakness of her extremities.  She does report some pain to the nose and to the right periorbital region.  Patient is not currently on any blood thinners.  ROS: Fall, facial pain  Physical Exam:   Gen: No distress  Neuro: Awake and Alert  Skin: Warm    Focused Exam: EOMs intact without any difficulty or signs of entrapment.  Tenderness palpation noted to the right periorbital region and right nasal bridge.  There is swelling and ecchymosis overlying the nasal bridge bilaterally.  No septal hematoma noted.  Cranial nerves II through XII intact without difficulty.  5 out of 5 strength of bilateral upper and lower extremity.   Initiation of care has begun. The patient has been counseled on the process, plan, and necessity for staying for the completion/evaluation, and the remainder of the medical screening examination    Desma Mcgregor 06/22/17 Lizbeth Bark, MD 06/22/17 2306

## 2017-06-22 NOTE — ED Provider Notes (Signed)
[facility] EMERGENCY DEPARTMENT Provider Note   CSN: [id number] Arrival date & time: 06/22/17  1946     History   Chief Complaint Chief Complaint  Patient presents with  . Fall    HPI Shannon Moore is a 72 y.o. female.  72 year old female here after mechanical fall where she fell and struck her face.  No loss of consciousness.  Denies any neck pain.  Does have some nasal discomfort with mild bleeding that was controlled with direct pressure.  Denies any bleeding from her ear.  No chest or abdominal discomfort.  She initially went to an orthopedic after-hours clinic due to right wrist and bilateral knee pain.  Her knees were x-rayed there along with her wrist and she was found to have a small right wrist fracture which was treated with a Velcro splint.  She did experience some nausea and was sent here for intracranial imaging.  She has not had any confusion or vomiting.     Past Medical History:  Diagnosis Date  . CHF (congestive heart failure) ([place])   . Essential hypertension 01/08/2014  . GERD (gastroesophageal reflux disease)   . Gout   . Hyperlipidemia 01/08/2014  . Obesity (BMI 30-39.9) 01/08/2014  . Type 2 diabetes mellitus without complication ([place]) 94/85/4627    Patient Active Problem List   Diagnosis Date Noted  . Abnormal EKG 01/08/2014  . Chest pain 01/08/2014  . Dyspnea 01/08/2014  . Palpitations 01/08/2014  . Essential hypertension 01/08/2014  . Type 2 diabetes mellitus without complication ([place]) 03/50/0938  . Obesity (BMI 30-39.9) 01/08/2014  . Hyperlipidemia 01/08/2014    Past Surgical History:  Procedure Laterality Date  . CARDIAC SURGERY    . DIAGNOSTIC LAPAROSCOPY    . EYE SURGERY     bilateral  RK 10 yrs ago  . left and right knee arthroscopies- left 6 months ago    . LEFT HEART CATHETERIZATION WITH CORONARY ANGIOGRAM N/A 01/10/2014   Procedure: LEFT HEART CATHETERIZATION WITH CORONARY ANGIOGRAM;  Surgeon: Jacolyn Reedy,  MD;  Location: [facility] CATH LAB;  Service: Cardiovascular;  Laterality: N/A;  . removal of bladder tumor    . TOTAL KNEE ARTHROPLASTY  11/16/2010   Procedure: TOTAL KNEE ARTHROPLASTY;  Surgeon: Mauri Pole;  Location: [facility] ORS;  Service: Orthopedics;  Laterality: Left;  . TUBAL LIGATION       OB History   None      Home Medications    Prior to Admission medications   Medication Sig Start Date End Date Taking? Authorizing Provider  allopurinol (ZYLOPRIM) 300 MG tablet Take 300 mg by mouth at bedtime.    Yes [provider]  aspirin EC 81 MG tablet Take 81 mg by mouth daily.   Yes [provider]  cetirizine (ZYRTEC) 10 MG tablet Take 10 mg by mouth daily.    Yes [provider]  DUREZOL 0.05 % EMUL Place 1 drop into the right eye 4 (four) times daily. 06/12/17  Yes [provider]  metFORMIN (GLUCOPHAGE-XR) 500 MG 24 hr tablet Take 1,000 mg by mouth daily with supper.    Yes [provider]  metoprolol succinate (TOPROL-XL) 25 MG 24 hr tablet  02/14/17  Yes [provider]  moxifloxacin (VIGAMOX) 0.5 % ophthalmic solution Place 1 drop into the right eye 4 (four) times daily. Start 2 days prior to surgery and 2 drops the morning of 06/12/17  Yes [provider]  NEOMYCIN-POLYMYXIN-HYDROCORTISONE (CORTISPORIN) 1 % SOLN OTIC  solution Apply 1-2 drops to toe BID after soaking 04/05/17  Yes Hyatt, Max T, DPM  olmesartan-hydrochlorothiazide (BENICAR HCT) 20-12.5 MG per tablet Take 1 tablet by mouth at bedtime.    Yes [provider]  pravastatin (PRAVACHOL) 80 MG tablet Take 80 mg by mouth at bedtime.    Yes [provider]  tiZANidine (ZANAFLEX) 2 MG tablet Take 2 mg by mouth every 6 (six) hours as needed for muscle spasms.   Yes [provider]    Family History No family history on file.  Social History Social History   Tobacco Use  . Smoking status: Former Smoker    Packs/day: 1.00    Years: 8.00    Pack  years: 8.00    Last attempt to quit: 11/10/1977    Years since quitting: 39.6  . Smokeless tobacco: Never Used  Substance Use Topics  . Alcohol use: Yes    Alcohol/week: 0.6 oz    Types: 1 Glasses of wine per week  . Drug use: No     Allergies   Atorvastatin; Sertraline; Sulfa antibiotics; Sulfasalazine; and Penicillins   Review of Systems Review of Systems  All other systems reviewed and are negative.    Physical Exam Updated Vital Signs BP (!) 157/78 (BP Location: Right Arm)   Pulse (!) 109   Temp 98.8 F (37.1 C) (Oral)   Resp 18   Ht 1.575 m (5\' 2" )   Wt 79.4 kg (175 lb)   SpO2 97%   BMI 32.01 kg/m   Physical Exam  Constitutional: She is oriented to person, place, and time. She appears well-developed and well-nourished.  Non-toxic appearance. No distress.  HENT:  Head: Normocephalic. Head is with contusion.    Nose: No nasal deformity. No epistaxis.    Eyes: Pupils are equal, round, and reactive to light. Conjunctivae, EOM and lids are normal.  Neck: Normal range of motion. Neck supple. No tracheal deviation present. No thyroid mass present.  Cardiovascular: Normal rate, regular rhythm and normal heart sounds. Exam reveals no gallop.  No murmur heard. Pulmonary/Chest: Effort normal and breath sounds normal. No stridor. No respiratory distress. She has no decreased breath sounds. She has no wheezes. She has no rhonchi. She has no rales.  Abdominal: Soft. Normal appearance and bowel sounds are normal. She exhibits no distension. There is no tenderness. There is no rebound and no CVA tenderness.  Musculoskeletal: Normal range of motion. She exhibits no edema or tenderness.  Neurological: She is alert and oriented to person, place, and time. She has normal strength. No cranial nerve deficit or sensory deficit. GCS eye subscore is 4. GCS verbal subscore is 5. GCS motor subscore is 6.  Skin: Skin is warm and dry. No abrasion and no rash noted.  Psychiatric: She has  a normal mood and affect. Her speech is normal and behavior is normal.  Nursing note and vitals reviewed.    ED Treatments / Results  Labs (all labs ordered are listed, but only abnormal results are displayed) Labs Reviewed - No data to display  EKG None  Radiology Ct Head Wo Contrast  Result Date: 06/22/2017 CLINICAL DATA:  Headache after trauma EXAM: CT HEAD WITHOUT CONTRAST CT MAXILLOFACIAL WITHOUT CONTRAST TECHNIQUE: Multidetector CT imaging of the head and maxillofacial structures were performed using the standard protocol without intravenous contrast. Multiplanar CT image reconstructions of the maxillofacial structures were also generated. COMPARISON:  None. FINDINGS: CT HEAD FINDINGS Brain: Age-appropriate involutional changes of the brain with minimal  small vessel ischemic disease. No acute intracranial hemorrhage, midline shift or edema. No large vascular territory infarct, intra-axial mass nor extra-axial fluid collections. Midline fourth ventricle and basal cisterns without effacement. Cerebellum and brainstem are within normal limits. Vascular: No hyperdense vessel sign. Skull: Intact Other: None CT MAXILLOFACIAL FINDINGS Osseous: Uncovertebral joint osteoarthritis of the included C4-5 and C5-6 disc levels with disc space narrowing. Slight dorsally angulated nasal bone fracture, age indeterminate. Orbits: Intact orbits and globes Sinuses: Mild anterior ethmoid sinus mucosal thickening. Mucous retention cyst of the left maxillary sinus. Soft tissues: Mild right forehead contusion. IMPRESSION: 1. Mild right forehead contusion with swelling. 2. No acute intracranial abnormality. 3. Slight dorsally angulated nasal bone fracture, age indeterminate. 4. 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 Electronically Signed   By: Ashley Royalty M.D.   On: 06/22/2017 20:29   Ct Maxillofacial Wo Contrast  Result Date: 06/22/2017 CLINICAL  DATA:  Headache after trauma EXAM: CT HEAD WITHOUT CONTRAST CT MAXILLOFACIAL WITHOUT CONTRAST TECHNIQUE: Multidetector CT imaging of the head and maxillofacial structures were performed using the standard protocol without intravenous contrast. Multiplanar CT image reconstructions of the maxillofacial structures were also generated. COMPARISON:  None. FINDINGS: CT HEAD FINDINGS Brain: Age-appropriate involutional changes of the brain with minimal small vessel ischemic disease. No acute intracranial hemorrhage, midline shift or edema. No large vascular territory infarct, intra-axial mass nor extra-axial fluid collections. Midline fourth ventricle and basal cisterns without effacement. Cerebellum and brainstem are within normal limits. Vascular: No hyperdense vessel sign. Skull: Intact Other: None CT MAXILLOFACIAL FINDINGS Osseous: Uncovertebral joint osteoarthritis of the included C4-5 and C5-6 disc levels with disc space narrowing. Slight dorsally angulated nasal bone fracture, age indeterminate. Orbits: Intact orbits and globes Sinuses: Mild anterior ethmoid sinus mucosal thickening. Mucous retention cyst of the left maxillary sinus. Soft tissues: Mild right forehead contusion. IMPRESSION: 1. Mild right forehead contusion with swelling. 2. No acute intracranial abnormality. 3. Slight dorsally angulated nasal bone fracture, age indeterminate. 4. 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 Electronically Signed   By: Ashley Royalty M.D.   On: 06/22/2017 20:29    Procedures Procedures (including critical care time)  Medications Ordered in ED Medications - No data to display   Initial Impression / Assessment and Plan / ED Course  I have reviewed the triage vital signs and the nursing notes.  Pertinent labs & imaging results that were available during my care of the patient were reviewed by me and considered in my medical decision making (see chart  for details).     Patient's x-rays reviewed here.  Does have a small nondisplaced nasal bone fracture.  She is stable for discharge  Final Clinical Impressions(s) / ED Diagnoses   Final diagnoses:  None    ED Discharge Orders    None       Lacretia Leigh, MD 06/22/17 2211

## 2017-06-23 DIAGNOSIS — H25811 Combined forms of age-related cataract, right eye: Secondary | ICD-10-CM | POA: Diagnosis not present

## 2017-06-23 DIAGNOSIS — H2511 Age-related nuclear cataract, right eye: Secondary | ICD-10-CM | POA: Diagnosis not present

## 2017-07-06 DIAGNOSIS — E1169 Type 2 diabetes mellitus with other specified complication: Secondary | ICD-10-CM | POA: Diagnosis not present

## 2017-07-06 DIAGNOSIS — I10 Essential (primary) hypertension: Secondary | ICD-10-CM | POA: Diagnosis not present

## 2017-07-06 DIAGNOSIS — M25512 Pain in left shoulder: Secondary | ICD-10-CM | POA: Diagnosis not present

## 2017-07-06 DIAGNOSIS — M25511 Pain in right shoulder: Secondary | ICD-10-CM | POA: Diagnosis not present

## 2017-07-06 DIAGNOSIS — E785 Hyperlipidemia, unspecified: Secondary | ICD-10-CM | POA: Diagnosis not present

## 2017-07-07 DIAGNOSIS — H2512 Age-related nuclear cataract, left eye: Secondary | ICD-10-CM | POA: Diagnosis not present

## 2017-07-07 DIAGNOSIS — H25812 Combined forms of age-related cataract, left eye: Secondary | ICD-10-CM | POA: Diagnosis not present

## 2017-07-15 DIAGNOSIS — M25512 Pain in left shoulder: Secondary | ICD-10-CM | POA: Diagnosis not present

## 2017-07-15 DIAGNOSIS — M1711 Unilateral primary osteoarthritis, right knee: Secondary | ICD-10-CM | POA: Diagnosis not present

## 2017-07-15 DIAGNOSIS — M25511 Pain in right shoulder: Secondary | ICD-10-CM | POA: Diagnosis not present

## 2017-07-15 DIAGNOSIS — M25561 Pain in right knee: Secondary | ICD-10-CM | POA: Diagnosis not present

## 2017-07-22 DIAGNOSIS — M1711 Unilateral primary osteoarthritis, right knee: Secondary | ICD-10-CM | POA: Diagnosis not present

## 2017-07-29 DIAGNOSIS — M25561 Pain in right knee: Secondary | ICD-10-CM | POA: Diagnosis not present

## 2017-07-29 DIAGNOSIS — M1711 Unilateral primary osteoarthritis, right knee: Secondary | ICD-10-CM | POA: Diagnosis not present

## 2017-09-01 DIAGNOSIS — M1711 Unilateral primary osteoarthritis, right knee: Secondary | ICD-10-CM | POA: Diagnosis not present

## 2017-09-01 DIAGNOSIS — M25561 Pain in right knee: Secondary | ICD-10-CM | POA: Diagnosis not present

## 2017-10-06 DIAGNOSIS — I10 Essential (primary) hypertension: Secondary | ICD-10-CM | POA: Diagnosis not present

## 2017-10-06 DIAGNOSIS — E1169 Type 2 diabetes mellitus with other specified complication: Secondary | ICD-10-CM | POA: Diagnosis not present

## 2017-10-06 DIAGNOSIS — E785 Hyperlipidemia, unspecified: Secondary | ICD-10-CM | POA: Diagnosis not present

## 2017-10-06 DIAGNOSIS — Z23 Encounter for immunization: Secondary | ICD-10-CM | POA: Diagnosis not present

## 2017-10-06 DIAGNOSIS — J302 Other seasonal allergic rhinitis: Secondary | ICD-10-CM | POA: Diagnosis not present

## 2018-01-30 DIAGNOSIS — M25561 Pain in right knee: Secondary | ICD-10-CM | POA: Diagnosis not present

## 2018-01-30 DIAGNOSIS — M1711 Unilateral primary osteoarthritis, right knee: Secondary | ICD-10-CM | POA: Diagnosis not present

## 2018-02-07 DIAGNOSIS — H40013 Open angle with borderline findings, low risk, bilateral: Secondary | ICD-10-CM | POA: Diagnosis not present

## 2018-03-13 DIAGNOSIS — Z1389 Encounter for screening for other disorder: Secondary | ICD-10-CM | POA: Diagnosis not present

## 2018-03-13 DIAGNOSIS — Z79899 Other long term (current) drug therapy: Secondary | ICD-10-CM | POA: Diagnosis not present

## 2018-03-13 DIAGNOSIS — Z0000 Encounter for general adult medical examination without abnormal findings: Secondary | ICD-10-CM | POA: Diagnosis not present

## 2018-03-13 DIAGNOSIS — E2839 Other primary ovarian failure: Secondary | ICD-10-CM | POA: Diagnosis not present

## 2018-03-13 DIAGNOSIS — R252 Cramp and spasm: Secondary | ICD-10-CM | POA: Diagnosis not present

## 2018-03-13 DIAGNOSIS — M109 Gout, unspecified: Secondary | ICD-10-CM | POA: Diagnosis not present

## 2018-03-13 DIAGNOSIS — E1169 Type 2 diabetes mellitus with other specified complication: Secondary | ICD-10-CM | POA: Diagnosis not present

## 2018-03-13 DIAGNOSIS — I10 Essential (primary) hypertension: Secondary | ICD-10-CM | POA: Diagnosis not present

## 2018-03-13 DIAGNOSIS — E785 Hyperlipidemia, unspecified: Secondary | ICD-10-CM | POA: Diagnosis not present

## 2018-03-13 DIAGNOSIS — Z23 Encounter for immunization: Secondary | ICD-10-CM | POA: Diagnosis not present

## 2018-03-21 DIAGNOSIS — E2839 Other primary ovarian failure: Secondary | ICD-10-CM | POA: Diagnosis not present

## 2018-03-21 DIAGNOSIS — Z96652 Presence of left artificial knee joint: Secondary | ICD-10-CM | POA: Diagnosis not present

## 2018-03-21 DIAGNOSIS — Z780 Asymptomatic menopausal state: Secondary | ICD-10-CM | POA: Diagnosis not present

## 2018-03-21 DIAGNOSIS — E119 Type 2 diabetes mellitus without complications: Secondary | ICD-10-CM | POA: Diagnosis not present

## 2018-03-21 DIAGNOSIS — Z8262 Family history of osteoporosis: Secondary | ICD-10-CM | POA: Diagnosis not present

## 2018-04-10 DIAGNOSIS — E785 Hyperlipidemia, unspecified: Secondary | ICD-10-CM | POA: Diagnosis not present

## 2018-04-10 DIAGNOSIS — I10 Essential (primary) hypertension: Secondary | ICD-10-CM | POA: Diagnosis not present

## 2018-04-10 DIAGNOSIS — E1169 Type 2 diabetes mellitus with other specified complication: Secondary | ICD-10-CM | POA: Diagnosis not present

## 2018-05-04 DIAGNOSIS — E1169 Type 2 diabetes mellitus with other specified complication: Secondary | ICD-10-CM | POA: Diagnosis not present

## 2018-05-04 DIAGNOSIS — I10 Essential (primary) hypertension: Secondary | ICD-10-CM | POA: Diagnosis not present

## 2018-05-04 DIAGNOSIS — E785 Hyperlipidemia, unspecified: Secondary | ICD-10-CM | POA: Diagnosis not present

## 2018-06-08 DIAGNOSIS — E1169 Type 2 diabetes mellitus with other specified complication: Secondary | ICD-10-CM | POA: Diagnosis not present

## 2018-06-08 DIAGNOSIS — E785 Hyperlipidemia, unspecified: Secondary | ICD-10-CM | POA: Diagnosis not present

## 2018-06-08 DIAGNOSIS — I10 Essential (primary) hypertension: Secondary | ICD-10-CM | POA: Diagnosis not present

## 2018-07-10 DIAGNOSIS — E785 Hyperlipidemia, unspecified: Secondary | ICD-10-CM | POA: Diagnosis not present

## 2018-07-10 DIAGNOSIS — I10 Essential (primary) hypertension: Secondary | ICD-10-CM | POA: Diagnosis not present

## 2018-07-10 DIAGNOSIS — E1169 Type 2 diabetes mellitus with other specified complication: Secondary | ICD-10-CM | POA: Diagnosis not present

## 2018-07-18 DIAGNOSIS — I10 Essential (primary) hypertension: Secondary | ICD-10-CM | POA: Diagnosis not present

## 2018-07-18 DIAGNOSIS — E1169 Type 2 diabetes mellitus with other specified complication: Secondary | ICD-10-CM | POA: Diagnosis not present

## 2018-07-18 DIAGNOSIS — E785 Hyperlipidemia, unspecified: Secondary | ICD-10-CM | POA: Diagnosis not present

## 2018-09-11 DIAGNOSIS — I10 Essential (primary) hypertension: Secondary | ICD-10-CM | POA: Diagnosis not present

## 2018-09-11 DIAGNOSIS — E785 Hyperlipidemia, unspecified: Secondary | ICD-10-CM | POA: Diagnosis not present

## 2018-09-11 DIAGNOSIS — E1169 Type 2 diabetes mellitus with other specified complication: Secondary | ICD-10-CM | POA: Diagnosis not present

## 2018-09-13 DIAGNOSIS — E1169 Type 2 diabetes mellitus with other specified complication: Secondary | ICD-10-CM | POA: Diagnosis not present

## 2018-09-13 DIAGNOSIS — I10 Essential (primary) hypertension: Secondary | ICD-10-CM | POA: Diagnosis not present

## 2018-09-13 DIAGNOSIS — E785 Hyperlipidemia, unspecified: Secondary | ICD-10-CM | POA: Diagnosis not present

## 2018-10-11 DIAGNOSIS — I10 Essential (primary) hypertension: Secondary | ICD-10-CM | POA: Diagnosis not present

## 2018-10-11 DIAGNOSIS — E1169 Type 2 diabetes mellitus with other specified complication: Secondary | ICD-10-CM | POA: Diagnosis not present

## 2018-10-11 DIAGNOSIS — E785 Hyperlipidemia, unspecified: Secondary | ICD-10-CM | POA: Diagnosis not present

## 2018-11-28 ENCOUNTER — Ambulatory Visit: Payer: Medicare HMO | Admitting: Podiatry

## 2018-11-28 ENCOUNTER — Other Ambulatory Visit: Payer: Self-pay | Admitting: Podiatry

## 2018-11-28 ENCOUNTER — Ambulatory Visit (INDEPENDENT_AMBULATORY_CARE_PROVIDER_SITE_OTHER): Payer: Medicare HMO

## 2018-11-28 ENCOUNTER — Encounter: Payer: Self-pay | Admitting: Podiatry

## 2018-11-28 ENCOUNTER — Other Ambulatory Visit: Payer: Self-pay

## 2018-11-28 DIAGNOSIS — E1142 Type 2 diabetes mellitus with diabetic polyneuropathy: Secondary | ICD-10-CM

## 2018-11-28 DIAGNOSIS — M778 Other enthesopathies, not elsewhere classified: Secondary | ICD-10-CM

## 2018-11-28 MED ORDER — GABAPENTIN 300 MG PO CAPS: 300.0000 mg | ORAL_CAPSULE | Freq: Every day | ORAL | 3 refills | Status: DC

## 2018-11-28 NOTE — Progress Notes (Signed)
She presents today for chief complaint stating that the whole bottom of her foot both feet are painful states that the blood sugar sort of comes and goes but seems to be worse at nighttime.  The tingling and numbness is worse at night in both feet really does not bother her much during the day she states that she does have back problems and is a diabetic and last hemoglobin A1c was at 6.8.  That was done in September.  Objective: Vital signs are stable she is alert and oriented x3.  Pulses are palpable.  Neurologic sensorium slightly diminished per Semmes Weinstein monofilament no open lesions or wounds.  Assessment: Diabetic peripheral neuropathy.  Plan: Started her on 300 mg of gabapentin 1 p.o. nightly #30 I will follow-up with her in 1 month to make sure she is doing well with this if she is not improved we will consider increasing the dosage.

## 2018-12-08 ENCOUNTER — Ambulatory Visit (INDEPENDENT_AMBULATORY_CARE_PROVIDER_SITE_OTHER): Payer: Medicare HMO

## 2018-12-08 ENCOUNTER — Ambulatory Visit
Admission: EM | Admit: 2018-12-08 | Discharge: 2018-12-08 | Disposition: A | Discharge status: Home / Self Care | Payer: Medicare HMO | Attending: Emergency Medicine | Admitting: Emergency Medicine

## 2018-12-08 ENCOUNTER — Other Ambulatory Visit: Payer: Self-pay

## 2018-12-08 ENCOUNTER — Encounter: Payer: Self-pay | Admitting: Emergency Medicine

## 2018-12-08 DIAGNOSIS — R062 Wheezing: Secondary | ICD-10-CM

## 2018-12-08 DIAGNOSIS — Z20828 Contact with and (suspected) exposure to other viral communicable diseases: Secondary | ICD-10-CM

## 2018-12-08 DIAGNOSIS — R05 Cough: Secondary | ICD-10-CM | POA: Diagnosis not present

## 2018-12-08 DIAGNOSIS — R059 Cough, unspecified: Secondary | ICD-10-CM

## 2018-12-08 DIAGNOSIS — E119 Type 2 diabetes mellitus without complications: Secondary | ICD-10-CM | POA: Diagnosis not present

## 2018-12-08 DIAGNOSIS — I509 Heart failure, unspecified: Secondary | ICD-10-CM | POA: Diagnosis not present

## 2018-12-08 MED ORDER — ALBUTEROL SULFATE HFA 108 (90 BASE) MCG/ACT IN AERS: 2.0000 | INHALATION_SPRAY | RESPIRATORY_TRACT | 0 refills | Status: DC | PRN

## 2018-12-08 MED ORDER — PREDNISONE 10 MG (21) PO TBPK: ORAL_TABLET | Freq: Every day | ORAL | 0 refills | Status: DC

## 2018-12-08 NOTE — ED Triage Notes (Signed)
Pt presents to [facility] for assessment of back aches Monday, and cough developing Tuesday.  Pt states the cough has gotten progressively worse.  Husband seen here this morning and dx with bronchitis.

## 2018-12-08 NOTE — Discharge Instructions (Signed)
Your COVID test is pending - it is important to quarantine / isolate at home until your results are back. °If you test positive and would like further evaluation for persistent or worsening symptoms, you may schedule an E-visit or virtual (video) visit throughout the [facility] MyChart app or website. ° °PLEASE NOTE: If you develop severe chest pain or shortness of breath please go to the ER or call 9-1-1 for further evaluation --> DO NOT schedule electronic or virtual visits for this. °Please call our office for further guidance / recommendations as needed. °

## 2018-12-08 NOTE — ED Notes (Signed)
Patient able to ambulate independently  

## 2018-12-08 NOTE — ED Provider Notes (Signed)
[facility]    CSN: [id number] Arrival date & time: 12/08/18  1643      History   Chief Complaint Chief Complaint  Patient presents with   Cough    HPI Shannon Moore is a 73 y.o. female with history of CHF, hypertension, obesity, type 2 diabetes presenting for dry, nonproductive, nonhemoptic cough since Monday.  Patient states symptoms started with myalgias which have since improved without intervention.  Patient is not sure anything for symptoms.  Her husband was diagnosed with bronchitis today and recommended she be evaluated, tested for Covid as well.  Covid testing was brought by patient due to her daughter visiting.  States her daughter is a Pharmacist, hospital and had a secondhand exposure the other week.  Daughter has been afebrile, asymptomatic since reported exposure.  Patient denies history of asthma, allergies.  States she was able to go for her daily 30-minute walk without dyspnea, chest pain, lightheadedness.  Patient denies prolonged stasis, recent travel, lower extremity edema, claudication, history of blood clot.   Past Medical History:  Diagnosis Date   CHF (congestive heart failure) ([place])    Essential hypertension 01/08/2014   GERD (gastroesophageal reflux disease)    Gout    Hyperlipidemia 01/08/2014   Obesity (BMI 30-39.9) 01/08/2014   Type 2 diabetes mellitus without complication ([place]) 123456    Patient Active Problem List   Diagnosis Date Noted   Abnormal EKG 01/08/2014   Chest pain 01/08/2014   Dyspnea 01/08/2014   Palpitations 01/08/2014   Essential hypertension 01/08/2014   Type 2 diabetes mellitus without complication ([place]) 123456   Obesity (BMI 30-39.9) 01/08/2014   Hyperlipidemia 01/08/2014    Past Surgical History:  Procedure Laterality Date   CARDIAC SURGERY     DIAGNOSTIC LAPAROSCOPY     EYE SURGERY     bilateral  RK 10 yrs ago   left and right knee arthroscopies- left 6 months ago     LEFT HEART  CATHETERIZATION WITH CORONARY ANGIOGRAM N/A 01/10/2014   Procedure: LEFT HEART CATHETERIZATION WITH CORONARY ANGIOGRAM;  Surgeon: Jacolyn Reedy, MD;  Location: [facility] CATH LAB;  Service: Cardiovascular;  Laterality: N/A;   removal of bladder tumor     TOTAL KNEE ARTHROPLASTY  11/16/2010   Procedure: TOTAL KNEE ARTHROPLASTY;  Surgeon: Mauri Pole;  Location: [facility] ORS;  Service: Orthopedics;  Laterality: Left;   TUBAL LIGATION      OB History   No obstetric history on file.      Home Medications    Prior to Admission medications   Medication Sig Start Date End Date Taking? Authorizing Provider  albuterol (VENTOLIN HFA) 108 (90 Base) MCG/ACT inhaler Inhale 2 puffs into the lungs every 4 (four) hours as needed for wheezing or shortness of breath. 12/08/18   Hall-Potvin, [place], PA-C  allopurinol (ZYLOPRIM) 300 MG tablet Take 300 mg by mouth at bedtime.     [provider]  BIOTIN PO Take by mouth.    [provider]  Cholecalciferol (VITAMIN D3 PO) Take by mouth.    [provider]  Cyanocobalamin (B-12 PO) Take by mouth.    [provider]  gabapentin (NEURONTIN) 300 MG capsule Take 1 capsule (300 mg total) by mouth at bedtime. 11/28/18   Hyatt, Max T, DPM  GLUCOSAMINE HCL PO Take by mouth.    [provider]  hydrochlorothiazide (HYDRODIURIL) 25 MG tablet  09/14/18   [provider]  MAGNESIUM GLYCINATE PO Take by mouth.  [provider]  metFORMIN (GLUCOPHAGE-XR) 500 MG 24 hr tablet Take 1,000 mg by mouth daily with supper.     [provider]  metoprolol succinate (TOPROL-XL) 25 MG 24 hr tablet  02/14/17   [provider]  Multiple Vitamins-Minerals (CENTRUM WOMEN PO) Take by mouth.    [provider]  olmesartan (BENICAR) 40 MG tablet  09/14/18   [provider]  Omega-3 Fatty Acids (OMEGA 3 PO) Take by mouth.    [provider]  pravastatin (PRAVACHOL) 80 MG tablet Take 80 mg  by mouth at bedtime.     [provider]  predniSONE (STERAPRED UNI-PAK 21 TAB) 10 MG (21) TBPK tablet Take by mouth daily. Take steroid taper as written 12/08/18   Hall-Potvin, [place], PA-C  TURMERIC PO Take by mouth.    [provider]    Family History Family History  Family history unknown: Yes    Social History Social History   Tobacco Use   Smoking status: Former Smoker    Packs/day: 1.00    Years: 8.00    Pack years: 8.00    Quit date: 11/10/1977    Years since quitting: 41.1   Smokeless tobacco: Never Used  Substance Use Topics   Alcohol use: Yes    Alcohol/week: 1.0 standard drinks    Types: 1 Glasses of wine per week   Drug use: No     Allergies   Atorvastatin, Sertraline, Sulfa antibiotics, Sulfasalazine, and Penicillins   Review of Systems Review of Systems  Constitutional: Negative for fatigue and fever.  HENT: Negative for congestion, dental problem, ear pain, facial swelling, hearing loss, sinus pain, sore throat, trouble swallowing and voice change.   Eyes: Negative for photophobia, pain and visual disturbance.  Respiratory: Positive for cough. Negative for shortness of breath and wheezing.   Cardiovascular: Negative for chest pain and palpitations.  Gastrointestinal: Negative for diarrhea and vomiting.  Musculoskeletal: Negative for arthralgias and myalgias.  Neurological: Negative for dizziness and headaches.     Physical Exam Triage Vital Signs ED Triage Vitals  Enc Vitals Group     BP      Pulse      Resp      Temp      Temp src      SpO2      Weight      Height      Head Circumference      Peak Flow      Pain Score      Pain Loc      Pain Edu?      Excl. in [place]?    No data found.  Updated Vital Signs BP (!) 163/79 (BP Location: Left Arm)    Pulse (!) 113    Temp 98.1 F (36.7 C) (Temporal)    Resp 18    SpO2 94% Comment: Done by PA  Visual Acuity Right Eye Distance:   Left Eye Distance:   Bilateral  Distance:    Right Eye Near:   Left Eye Near:    Bilateral Near:     Physical Exam Constitutional:      General: She is not in acute distress. HENT:     Head: Normocephalic and atraumatic.  Eyes:     General: No scleral icterus.    Pupils: Pupils are equal, round, and reactive to light.  Neck:     Comments: Trachea midline, negative JVD Cardiovascular:     Rate and Rhythm: Normal rate and  regular rhythm.  Pulmonary:     Effort: Pulmonary effort is normal. No respiratory distress.     Breath sounds: Wheezing present. No rhonchi or rales.     Comments: Decreased air movement bilaterally.  No prolonged expiratory phase. Musculoskeletal: Normal range of motion.     Right lower leg: No edema.     Left lower leg: No edema.  Skin:    Capillary Refill: Capillary refill takes less than 2 seconds.     Coloration: Skin is not jaundiced or pale.  Neurological:     Mental Status: She is alert and oriented to person, place, and time.      UC Treatments / Results  Labs (all labs ordered are listed, but only abnormal results are displayed) Labs Reviewed  NOVEL CORONAVIRUS, NAA    EKG   Radiology Dg Chest 2 View  Result Date: 12/08/2018 CLINICAL DATA:  Cough for 5 days. EXAM: CHEST - 2 VIEW COMPARISON:  April 21, 2014 FINDINGS: The heart size and mediastinal contours are within normal limits. Both lungs are clear. The visualized skeletal structures are unremarkable. IMPRESSION: No active cardiopulmonary disease. Electronically Signed   By: Dorise Bullion III M.D   On: 12/08/2018 17:53    Procedures Procedures (including critical care time)  Medications Ordered in UC Medications - No data to display  Initial Impression / Assessment and Plan / UC Course  I have reviewed the triage vital signs and the nursing notes.  Pertinent labs & imaging results that were available during my care of the patient were reviewed by me and considered in my medical decision making (see chart  for details).     Patient afebrile, nontoxic in office today.  EKG done in office, reviewed by me and compared to previous from 11/12/2010: Sinus tachycardia with ventricular rate of 113.  No QTC prolongation, ST elevation.  Patient does have baseline of T wave inversion in leads II, 3, aVF, V4-V6.  Waveforms largely stable.  No right axis deviation.  CXR done office, reviewed by me radiology: No cardiomegaly, pleural effusion, focal consolidation.  Covid PCR test pending: Patient continue quarantining with her husband.  Given decreased air movement, expiratory wheezing will add prednisone, albuterol as patient could have reactive process.  Return precautions discussed, patient verbalized understanding and is agreeable to plan. Final Clinical Impressions(s) / UC Diagnoses   Final diagnoses:  Cough     Discharge Instructions     Your COVID test is pending - it is important to quarantine / isolate at home until your results are back. If you test positive and would like further evaluation for persistent or worsening symptoms, you may schedule an E-visit or virtual (video) visit throughout the [facility] app or website.  PLEASE NOTE: If you develop severe chest pain or shortness of breath please go to the ER or call 9-1-1 for further evaluation --> DO NOT schedule electronic or virtual visits for this. Please call our office for further guidance / recommendations as needed.    ED Prescriptions    Medication Sig Dispense Auth. Provider   predniSONE (STERAPRED UNI-PAK 21 TAB) 10 MG (21) TBPK tablet Take by mouth daily. Take steroid taper as written 21 tablet Hall-Potvin, [place], PA-C   albuterol (VENTOLIN HFA) 108 (90 Base) MCG/ACT inhaler Inhale 2 puffs into the lungs every 4 (four) hours as needed for wheezing or shortness of breath. 18 g Hall-Potvin, [place], PA-C     PDMP not reviewed this encounter.   Hall-Potvin, [place],  PA-C 12/08/18 1918

## 2018-12-09 LAB — NOVEL CORONAVIRUS, NAA: SARS-CoV-2, NAA: NOT DETECTED

## 2018-12-14 IMAGING — CR DG SHOULDER 2+V*L*
3 series · 3 of 3 positions shown · non-contrast
Comparison: No prior.

CLINICAL DATA: Fall.

EXAM:
LEFT SHOULDER - 2+ VIEW

[w shoulder grashey left]
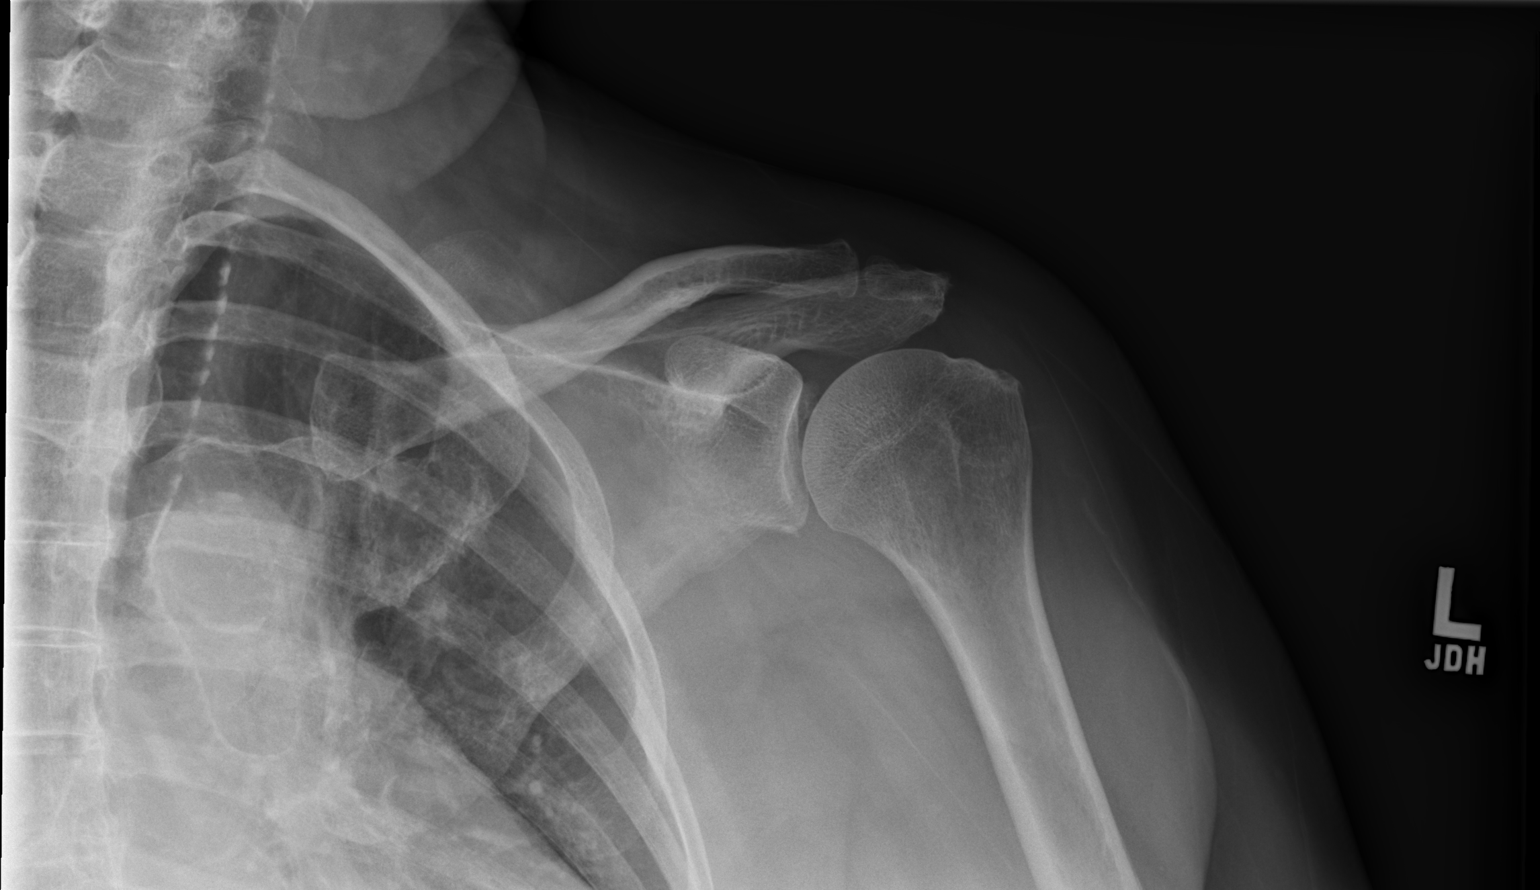

[w shoulder y-view left]
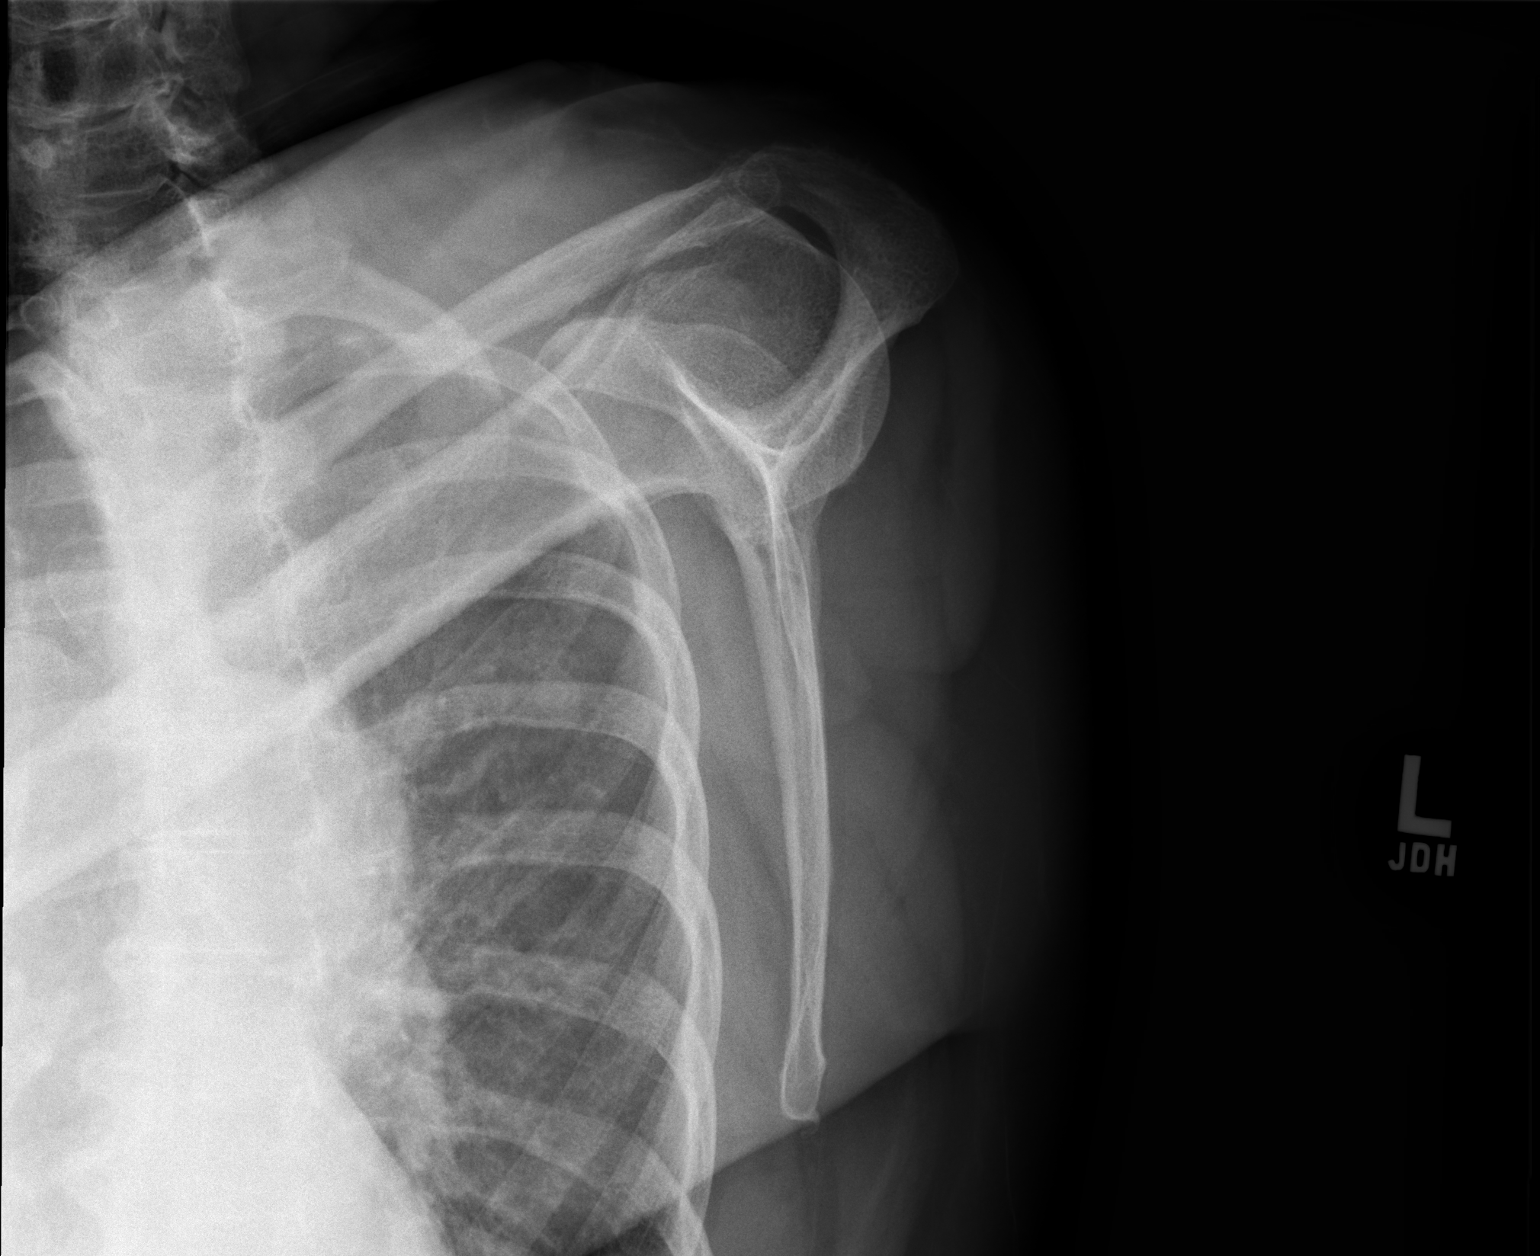

[w shoulder axillary left]
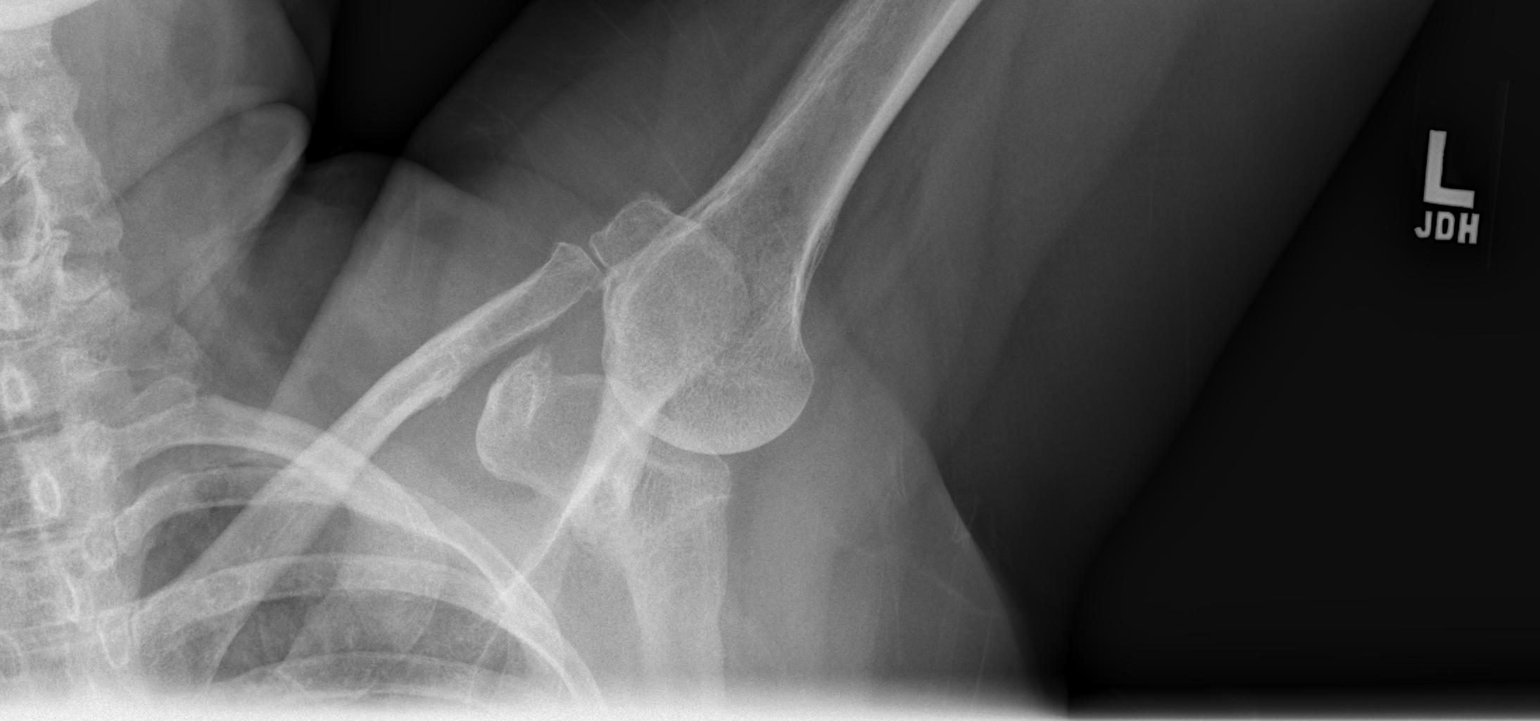

[3 of 3 positions shown; findings below may reference images not displayed]

FINDINGS: No acute bony or joint abnormality identified. No evidence of
fracture or dislocation.
IMPRESSION: No acute abnormality.

## 2019-02-08 ENCOUNTER — Ambulatory Visit: Payer: Medicare HMO

## 2019-02-11 DIAGNOSIS — E1169 Type 2 diabetes mellitus with other specified complication: Secondary | ICD-10-CM | POA: Diagnosis not present

## 2019-02-11 DIAGNOSIS — I10 Essential (primary) hypertension: Secondary | ICD-10-CM | POA: Diagnosis not present

## 2019-02-11 DIAGNOSIS — E785 Hyperlipidemia, unspecified: Secondary | ICD-10-CM | POA: Diagnosis not present

## 2019-02-16 ENCOUNTER — Ambulatory Visit: Payer: Medicare HMO | Attending: Internal Medicine

## 2019-02-16 DIAGNOSIS — Z23 Encounter for immunization: Secondary | ICD-10-CM | POA: Insufficient documentation

## 2019-02-16 NOTE — Progress Notes (Signed)
   Covid-19 Vaccination Clinic  Name:  Shannon Moore    MRN: [id number] DOB: [birth year]  02/16/2019  Shannon Moore was observed post Covid-19 immunization for 15 minutes without incidence. She was provided with Vaccine Information Sheet and instruction to access the V-Safe system.   Shannon Moore was instructed to call 911 with any severe reactions post vaccine: Marland Kitchen Difficulty breathing  . Swelling of your face and throat  . A fast heartbeat  . A bad rash all over your body  . Dizziness and weakness    Immunizations Administered    Name Date Dose VIS Date Route   Pfizer COVID-19 Vaccine 02/16/2019  6:16 PM 0.3 mL 12/22/2018 Intramuscular   Manufacturer: [place]   Lot: [id number]   [place]: [id number]

## 2019-02-19 ENCOUNTER — Ambulatory Visit: Payer: Medicare HMO

## 2019-02-23 DIAGNOSIS — E785 Hyperlipidemia, unspecified: Secondary | ICD-10-CM | POA: Diagnosis not present

## 2019-02-23 DIAGNOSIS — I10 Essential (primary) hypertension: Secondary | ICD-10-CM | POA: Diagnosis not present

## 2019-02-23 DIAGNOSIS — Z7984 Long term (current) use of oral hypoglycemic drugs: Secondary | ICD-10-CM | POA: Diagnosis not present

## 2019-02-23 DIAGNOSIS — E1169 Type 2 diabetes mellitus with other specified complication: Secondary | ICD-10-CM | POA: Diagnosis not present

## 2019-03-14 ENCOUNTER — Ambulatory Visit: Payer: Medicare HMO | Attending: Internal Medicine

## 2019-03-14 DIAGNOSIS — Z23 Encounter for immunization: Secondary | ICD-10-CM | POA: Insufficient documentation

## 2019-03-14 NOTE — Progress Notes (Signed)
   Covid-19 Vaccination Clinic  Name:  Shannon Moore    MRN: [id number] DOB: 09-26-1945  03/14/2019  Shannon Moore was observed post Covid-19 immunization for 15 minutes without incident. She was provided with Vaccine Information Sheet and instruction to access the V-Safe system.   Shannon Moore was instructed to call 911 with any severe reactions post vaccine: Marland Kitchen Difficulty breathing  . Swelling of face and throat  . A fast heartbeat  . A bad rash all over body  . Dizziness and weakness   Immunizations Administered    Name Date Dose VIS Date Route   Pfizer COVID-19 Vaccine 03/14/2019  2:03 PM 0.3 mL 12/22/2018 Intramuscular   Manufacturer: [place]   Lot: [id number]   [place]: [id number]

## 2019-03-28 DIAGNOSIS — E1169 Type 2 diabetes mellitus with other specified complication: Secondary | ICD-10-CM | POA: Diagnosis not present

## 2019-03-28 DIAGNOSIS — M545 Low back pain: Secondary | ICD-10-CM | POA: Diagnosis not present

## 2019-03-28 DIAGNOSIS — M109 Gout, unspecified: Secondary | ICD-10-CM | POA: Diagnosis not present

## 2019-03-28 DIAGNOSIS — N8110 Cystocele, unspecified: Secondary | ICD-10-CM | POA: Diagnosis not present

## 2019-03-28 DIAGNOSIS — E1142 Type 2 diabetes mellitus with diabetic polyneuropathy: Secondary | ICD-10-CM | POA: Diagnosis not present

## 2019-03-28 DIAGNOSIS — Z1159 Encounter for screening for other viral diseases: Secondary | ICD-10-CM | POA: Diagnosis not present

## 2019-03-28 DIAGNOSIS — I519 Heart disease, unspecified: Secondary | ICD-10-CM | POA: Diagnosis not present

## 2019-03-28 DIAGNOSIS — R1310 Dysphagia, unspecified: Secondary | ICD-10-CM | POA: Diagnosis not present

## 2019-03-28 DIAGNOSIS — J302 Other seasonal allergic rhinitis: Secondary | ICD-10-CM | POA: Diagnosis not present

## 2019-03-28 DIAGNOSIS — I10 Essential (primary) hypertension: Secondary | ICD-10-CM | POA: Diagnosis not present

## 2019-03-28 DIAGNOSIS — Z0000 Encounter for general adult medical examination without abnormal findings: Secondary | ICD-10-CM | POA: Diagnosis not present

## 2019-03-28 DIAGNOSIS — E785 Hyperlipidemia, unspecified: Secondary | ICD-10-CM | POA: Diagnosis not present

## 2019-04-16 DIAGNOSIS — M1711 Unilateral primary osteoarthritis, right knee: Secondary | ICD-10-CM | POA: Diagnosis not present

## 2019-04-16 DIAGNOSIS — M25561 Pain in right knee: Secondary | ICD-10-CM | POA: Diagnosis not present

## 2019-05-02 DIAGNOSIS — H52203 Unspecified astigmatism, bilateral: Secondary | ICD-10-CM | POA: Diagnosis not present

## 2019-05-02 DIAGNOSIS — E119 Type 2 diabetes mellitus without complications: Secondary | ICD-10-CM | POA: Diagnosis not present

## 2019-05-02 DIAGNOSIS — Z961 Presence of intraocular lens: Secondary | ICD-10-CM | POA: Diagnosis not present

## 2019-05-02 DIAGNOSIS — H40013 Open angle with borderline findings, low risk, bilateral: Secondary | ICD-10-CM | POA: Diagnosis not present

## 2019-05-21 ENCOUNTER — Other Ambulatory Visit: Payer: Self-pay | Admitting: Orthopedic Surgery

## 2019-05-21 DIAGNOSIS — M25561 Pain in right knee: Secondary | ICD-10-CM

## 2019-06-18 ENCOUNTER — Ambulatory Visit
Admission: RE | Admit: 2019-06-18 | Discharge: 2019-06-18 | Disposition: A | Discharge status: Home / Self Care | Payer: Medicare HMO | Source: Ambulatory Visit | Attending: Orthopedic Surgery | Admitting: Orthopedic Surgery

## 2019-06-18 ENCOUNTER — Other Ambulatory Visit: Payer: Self-pay

## 2019-06-18 DIAGNOSIS — M25561 Pain in right knee: Secondary | ICD-10-CM

## 2019-06-25 DIAGNOSIS — M1711 Unilateral primary osteoarthritis, right knee: Secondary | ICD-10-CM | POA: Diagnosis not present

## 2019-06-25 DIAGNOSIS — M25561 Pain in right knee: Secondary | ICD-10-CM | POA: Diagnosis not present

## 2019-07-02 DIAGNOSIS — E785 Hyperlipidemia, unspecified: Secondary | ICD-10-CM | POA: Diagnosis not present

## 2019-07-02 DIAGNOSIS — I10 Essential (primary) hypertension: Secondary | ICD-10-CM | POA: Diagnosis not present

## 2019-07-02 DIAGNOSIS — M1710 Unilateral primary osteoarthritis, unspecified knee: Secondary | ICD-10-CM | POA: Diagnosis not present

## 2019-07-02 DIAGNOSIS — E1169 Type 2 diabetes mellitus with other specified complication: Secondary | ICD-10-CM | POA: Diagnosis not present

## 2019-07-23 NOTE — H&P (Signed)
TOTAL KNEE ADMISSION H&P  Patient is being admitted for right total knee arthroplasty.  Subjective:  Chief Complaint:    Right knee OA / pain  HPI: Shannon Moore, 74 y.o. female, has a history of pain and functional disability in the right knee due to arthritis and has failed non-surgical conservative treatments for greater than 12 weeks to include NSAID's and/or analgesics, corticosteriod injections, viscosupplementation injections and activity modification.  Onset of symptoms was gradual, starting <1 year ago with rapidlly worsening course since that time. The patient noted prior procedures on the knee to include  arthroplasty on the left knee in 2012.  Patient currently rates pain in the right knee(s) at 8 out of 10 with activity. Patient has night pain, worsening of pain with activity and weight bearing, pain that interferes with activities of daily living, pain with passive range of motion, crepitus and joint swelling.  Patient has evidence of periarticular osteophytes and joint space narrowing by imaging studies.   There is no active infection.  Risks, benefits and expectations were discussed with the patient.  Risks including but not limited to the risk of anesthesia, blood clots, nerve damage, blood vessel damage, failure of the prosthesis, infection and up to and including death.  Patient understand the risks, benefits and expectations and wishes to proceed with surgery.    PCP: Shannon Stains, MD  D/C Plans:       Home with her husband   Post-op Meds:       No Rx given   Tranexamic Acid:      To be given - IV   Decadron:      Is to be given  FYI:      ASA  Norco  DME:   Pt will get RW from daughter. Needs a 3-n-1.  PT:   OPPT @ [facility]  Pharmacy: [facility] - [id number], [place], [place]   Patient Active Problem List   Diagnosis Date Noted  . Abnormal EKG 01/08/2014  . Chest pain 01/08/2014  . Dyspnea 01/08/2014  . Palpitations 01/08/2014  . Essential hypertension 01/08/2014   . Type 2 diabetes mellitus without complication ([place]) 46/50/3546  . Obesity (BMI 30-39.9) 01/08/2014  . Hyperlipidemia 01/08/2014   Past Medical History:  Diagnosis Date  . CHF (congestive heart failure) ([place])   . Essential hypertension 01/08/2014  . GERD (gastroesophageal reflux disease)   . Gout   . Hyperlipidemia 01/08/2014  . Obesity (BMI 30-39.9) 01/08/2014  . Type 2 diabetes mellitus without complication ([place]) 56/81/2751    Past Surgical History:  Procedure Laterality Date  . CARDIAC SURGERY    . DIAGNOSTIC LAPAROSCOPY    . EYE SURGERY     bilateral  RK 10 yrs ago  . left and right knee arthroscopies- left 6 months ago    . LEFT HEART CATHETERIZATION WITH CORONARY ANGIOGRAM N/A 01/10/2014   Procedure: LEFT HEART CATHETERIZATION WITH CORONARY ANGIOGRAM;  Surgeon: Jacolyn Reedy, MD;  Location: [facility] CATH LAB;  Service: Cardiovascular;  Laterality: N/A;  . removal of bladder tumor    . TOTAL KNEE ARTHROPLASTY  11/16/2010   Procedure: TOTAL KNEE ARTHROPLASTY;  Surgeon: Mauri Pole;  Location: [facility];  Service: Orthopedics;  Laterality: Left;  . TUBAL LIGATION      No current facility-administered medications for this encounter.   Current Outpatient Medications  Medication Sig Dispense Refill Last Dose  . allopurinol (ZYLOPRIM) 300 MG tablet Take 300 mg by mouth at bedtime.      Marland Kitchen  fluticasone (FLONASE) 50 MCG/ACT nasal spray Place 2 sprays into both nostrils daily as needed for allergies or rhinitis.     Marland Kitchen gabapentin (NEURONTIN) 100 MG capsule Take 200 mg by mouth at bedtime.     . hydrochlorothiazide (HYDRODIURIL) 25 MG tablet Take 25 mg by mouth daily.      . metFORMIN (GLUCOPHAGE-XR) 500 MG 24 hr tablet Take 1,000 mg by mouth 2 (two) times daily with a meal.      . metoprolol succinate (TOPROL-XL) 25 MG 24 hr tablet Take 25 mg by mouth daily.      Marland Kitchen olmesartan (BENICAR) 40 MG tablet Take 40 mg by mouth daily.      . pravastatin (PRAVACHOL) 80 MG tablet Take 80 mg by  mouth at bedtime.      Marland Kitchen albuterol (VENTOLIN HFA) 108 (90 Base) MCG/ACT inhaler Inhale 2 puffs into the lungs every 4 (four) hours as needed for wheezing or shortness of breath. (Patient not taking: Reported on 07/17/2019) 18 g 0 Not Taking at Unknown time  . BIOTIN PO Take by mouth. (Patient not taking: Reported on 07/17/2019)   Not Taking at Unknown time  . Cholecalciferol (VITAMIN D3 PO) Take by mouth. (Patient not taking: Reported on 07/17/2019)   Not Taking at Unknown time  . Cyanocobalamin (B-12 PO) Take by mouth. (Patient not taking: Reported on 07/17/2019)   Not Taking at Unknown time  . gabapentin (NEURONTIN) 300 MG capsule Take 1 capsule (300 mg total) by mouth at bedtime. (Patient not taking: Reported on 07/17/2019) 30 capsule 3 Not Taking at Unknown time  . GLUCOSAMINE HCL PO Take by mouth. (Patient not taking: Reported on 07/17/2019)   Not Taking at Unknown time  . MAGNESIUM GLYCINATE PO Take by mouth. (Patient not taking: Reported on 07/17/2019)   Not Taking at Unknown time  . Multiple Vitamins-Minerals (CENTRUM WOMEN PO) Take by mouth. (Patient not taking: Reported on 07/17/2019)   Not Taking at Unknown time  . Omega-3 Fatty Acids (OMEGA 3 PO) Take by mouth. (Patient not taking: Reported on 07/17/2019)   Not Taking at Unknown time  . predniSONE (STERAPRED UNI-PAK 21 TAB) 10 MG (21) TBPK tablet Take by mouth daily. Take steroid taper as written (Patient not taking: Reported on 07/17/2019) 21 tablet 0 Not Taking at Unknown time  . TURMERIC PO Take by mouth. (Patient not taking: Reported on 07/17/2019)   Not Taking at Unknown time   Allergies  Allergen Reactions  . Atorvastatin Other (See Comments)    Intolerance   . Sertraline Other (See Comments)    Congestive heart failure.  . Sulfa Antibiotics Itching  . Sulfasalazine Itching  . Penicillins Rash    Has patient had a PCN reaction causing immediate rash, facial/tongue/throat swelling, SOB or lightheadedness with hypotension: Yes Has patient had a PCN  reaction causing severe rash involving mucus membranes or skin necrosis: No Has patient had a PCN reaction that required hospitalization: No Has patient had a PCN reaction occurring within the last 10 years: No If all of the above answers are "NO", then may proceed with Cephalosporin use.     Social History   Tobacco Use  . Smoking status: Former Smoker    Packs/day: 1.00    Years: 8.00    Pack years: 8.00    Quit date: 11/10/1977    Years since quitting: 41.7  . Smokeless tobacco: Never Used  Substance Use Topics  . Alcohol use: Yes    Alcohol/week: 1.0 standard drink  Types: 1 Glasses of wine per week    Family History  Family history unknown: Yes     Review of Systems  Constitutional: Negative.   HENT: Negative.   Eyes: Negative.   Respiratory: Negative.   Cardiovascular: Negative.   Gastrointestinal: Negative.   Genitourinary: Negative.   Musculoskeletal: Positive for joint pain.  Skin: Negative.   Neurological: Negative.   Endo/Heme/Allergies: Negative.   Psychiatric/Behavioral: Negative.       Objective:  Physical Exam Constitutional:      Appearance: She is well-developed.  HENT:     Head: Normocephalic.  Eyes:     Pupils: Pupils are equal, round, and reactive to light.  Neck:     Thyroid: No thyromegaly.     Vascular: No JVD.     Trachea: No tracheal deviation.  Cardiovascular:     Rate and Rhythm: Normal rate and regular rhythm.  Pulmonary:     Effort: Pulmonary effort is normal. No respiratory distress.     Breath sounds: Normal breath sounds. No wheezing.  Abdominal:     Palpations: Abdomen is soft.     Tenderness: There is no abdominal tenderness. There is no guarding.  Musculoskeletal:     Cervical back: Neck supple.     Right knee: Swelling and bony tenderness present. No erythema or ecchymosis. Decreased range of motion. Tenderness present.  Lymphadenopathy:     Cervical: No cervical adenopathy.  Skin:    General: Skin is warm and  dry.  Neurological:     Mental Status: She is alert and oriented to person, place, and time.       Labs:  Estimated body mass index is 32.01 kg/m as calculated from the following:   Height as of 06/22/17: 5\' 2"  (1.575 m).   Weight as of 06/22/17: 79.4 kg.   Imaging Review Plain radiographs demonstrate severe degenerative joint disease of the right knee.  The bone quality appears to be good for age and reported activity level.      Assessment/Plan:  End stage arthritis, right knee   The patient history, physical examination, clinical judgment of the provider and imaging studies are consistent with end stage degenerative joint disease of the right knee(s) and total knee arthroplasty is deemed medically necessary. The treatment options including medical management, injection therapy arthroscopy and arthroplasty were discussed at length. The risks and benefits of total knee arthroplasty were presented and reviewed. The risks due to aseptic loosening, infection, stiffness, patella tracking problems, thromboembolic complications and other imponderables were discussed. The patient acknowledged the explanation, agreed to proceed with the plan and consent was signed. Patient is being admitted for inpatient treatment for surgery, pain control, PT, OT, prophylactic antibiotics, VTE prophylaxis, progressive ambulation and ADL's and discharge planning. The patient is planning to be discharged home.      Patient's anticipated LOS is less than 2 midnights, meeting these requirements: - Lives within 1 hour of care - Has a competent adult at home to recover with post-op recover - NO history of  - Chronic pain requiring opiods  - Diabetes  - Coronary Artery Disease  - Heart failure  - Heart attack  - Stroke  - DVT/VTE  - Cardiac arrhythmia  - Respiratory Failure/COPD  - Renal failure  - Anemia  - Advanced Liver disease      [place]. Tavio Biegel   PA-C  07/23/2019, 12:19 PM

## 2019-07-24 DIAGNOSIS — E1142 Type 2 diabetes mellitus with diabetic polyneuropathy: Secondary | ICD-10-CM | POA: Diagnosis not present

## 2019-07-24 DIAGNOSIS — E785 Hyperlipidemia, unspecified: Secondary | ICD-10-CM | POA: Diagnosis not present

## 2019-07-24 DIAGNOSIS — I10 Essential (primary) hypertension: Secondary | ICD-10-CM | POA: Diagnosis not present

## 2019-07-24 DIAGNOSIS — E1169 Type 2 diabetes mellitus with other specified complication: Secondary | ICD-10-CM | POA: Diagnosis not present

## 2019-07-24 DIAGNOSIS — M1710 Unilateral primary osteoarthritis, unspecified knee: Secondary | ICD-10-CM | POA: Diagnosis not present

## 2019-07-30 NOTE — Patient Instructions (Addendum)
DUE TO COVID-19 ONLY ONE VISITOR IS ALLOWED TO COME WITH YOU AND STAY IN THE WAITING ROOM ONLY DURING PRE OP AND PROCEDURE DAY OF SURGERY. THE 2 VISITORS  MAY VISIT WITH YOU AFTER SURGERY IN YOUR PRIVATE ROOM DURING VISITING HOURS ONLY!  YOU NEED TO HAVE A COVID 19 TEST ON_7/23______ @_9 :05______, THIS TEST MUST BE DONE BEFORE SURGERY, COME  [place] , [address].  ([place]) ONCE YOUR COVID TEST IS COMPLETED, PLEASE BEGIN THE QUARANTINE INSTRUCTIONS AS OUTLINED IN YOUR HANDOUT.                [place]    Your procedure is scheduled on: 08/07/19   Report to [facility] Main  Entrance   Report to Short Stay at 5:30 AM     Call this number if you have problems the morning of surgery [phone]   . BRUSH YOUR TEETH MORNING OF SURGERY AND RINSE YOUR MOUTH OUT, NO CHEWING GUM CANDY OR MINTS.  Do not eat food After Midnight.   YOU MAY HAVE CLEAR LIQUIDS FROM MIDNIGHT UNTIL 4:30AM.   At 4:30AM Please finish the prescribed Pre-Surgery Gatorade drink.   Nothing by mouth after you finish the Gatorade drink !    Take these medicines the morning of surgery with A SIP OF WATER: Metoprolol, Flonase, use your inhaler and brink it with you  DO NOT TAKE ANY DIABETIC MEDICATIONS DAY OF YOUR SURGERY  How to Manage Your Diabetes Before and After Surgery  Why is it important to control my blood sugar before and after surgery?  Improving blood sugar levels before and after surgery helps healing and can limit problems.  A way of improving blood sugar control is eating a healthy diet by: o  Eating less sugar and carbohydrates o  Increasing activity/exercise o  Talking with your doctor about reaching your blood sugar goals  High blood sugars (greater than 180 mg/dL) can raise your risk of infections and slow your recovery, so you will need to focus on controlling your diabetes during the weeks before surgery.  Make sure that the doctor who  takes care of your diabetes knows about your planned surgery including the date and location.  How do I manage my blood sugar before surgery?  Check your blood sugar at least 4 times a day, starting 2 days before surgery, to make sure that the level is not too high or low. o Check your blood sugar the morning of your surgery when you wake up and every 2 hours until you get to the Short Stay unit.  If your blood sugar is less than 70 mg/dL, you will need to treat for low blood sugar: o Do not take insulin. o Treat a low blood sugar (less than 70 mg/dL) with  cup of clear juice (cranberry or apple), 4 glucose tablets, OR glucose gel. o Recheck blood sugar in 15 minutes after treatment (to make sure it is greater than 70 mg/dL). If your blood sugar is not greater than 70 mg/dL on recheck, call [phone] for further instructions.  Report your blood sugar to the short stay nurse when you get to Short Stay.   If you are admitted to the hospital after surgery: o Your blood sugar will be checked by the staff and you will probably be given insulin after surgery (instead of oral diabetes medicines) to make sure you have good blood sugar levels. o The goal for blood sugar control after  surgery is 80-180 mg/dL.   WHAT DO I DO ABOUT MY DIABETES MEDICATION?   Do not take oral diabetes medicines (pills) the morning of surgery.                           You may not have any metal on your body including hair pins and              piercings  Do not wear jewelry, make-up, lotions, powders or perfumes, deodorant             Do not wear nail polish on your fingernails.  Do not shave  48 hours prior to surgery.            Do not bring valuables to the hospital. [place].  Contacts, dentures or bridgework may not be worn into surgery.      Patients discharged the day of surgery will not be allowed to drive home.  IF YOU ARE HAVING SURGERY AND  GOING HOME THE SAME DAY, YOU MUST HAVE AN ADULT TO DRIVE YOU HOME AND BE WITH YOU FOR 24 HOURS.  YOU MAY GO HOME BY TAXI OR UBER OR ORTHERWISE, BUT AN ADULT MUST ACCOMPANY YOU HOME AND STAY WITH YOU FOR 24 HOURS.  Name and phone number of your driver:  Special Instructions: N/A              Please read over the following fact sheets you were given: _____________________________________________________________________             [facility] - Preparing for Surgery Before surgery, you can play an important role .  Because skin is not sterile, your skin needs to be as free of germs as possible.  You can reduce the number of germs on your skin by washing with CHG (chlorahexidine gluconate) soap before surgery.   CHG is an antiseptic cleaner which kills germs and bonds with the skin to continue killing germs even after washing. Please DO NOT use if you have an allergy to CHG or antibacterial soaps .  If your skin becomes reddened/irritated stop using the CHG and inform your nurse when you arrive at Short Stay. Do not shave (including legs and underarms) for at least 48 hours prior to the first CHG shower.   . Please follow these instructions carefully:  1.  Shower with CHG Soap the night before surgery and the  morning of Surgery.  2.  If you choose to wash your hair, wash your hair first as usual with your  normal  shampoo.  3.  After you shampoo, rinse your hair and body thoroughly to remove the  shampoo.                                        4.  Use CHG as you would any other liquid soap.  You can apply chg directly  to the skin and wash                       Gently with a scrungie or clean washcloth.  5.  Apply the CHG Soap to your body ONLY FROM THE NECK DOWN.   Do not use on face/ open  Wound or open sores. Avoid contact with eyes, ears mouth and genitals (private parts).                       Wash face,  Genitals (private parts) with your normal soap.              6.  Wash thoroughly, paying special attention to the area where your surgery  will be performed.  7.  Thoroughly rinse your body with warm water from the neck down.  8.  DO NOT shower/wash with your normal soap after using and rinsing off  the CHG Soap.             9.  Pat yourself dry with a clean towel.            10.  Wear clean pajamas.            11.  Place clean sheets on your bed the night of your first shower and do not  sleep with pets. Day of Surgery : Do not apply any lotions/deodorants the morning of surgery.  Please wear clean clothes to the hospital/surgery center.  FAILURE TO FOLLOW THESE INSTRUCTIONS MAY RESULT IN THE CANCELLATION OF YOUR SURGERY PATIENT SIGNATURE_________________________________  NURSE SIGNATURE__________________________________  ________________________________________________________________________

## 2019-07-31 ENCOUNTER — Other Ambulatory Visit: Payer: Self-pay

## 2019-07-31 ENCOUNTER — Encounter (HOSPITAL_COMMUNITY)
Admission: RE | Admit: 2019-07-31 | Discharge: 2019-07-31 | Disposition: A | Discharge status: Home / Self Care | Payer: Medicare HMO | Source: Ambulatory Visit | Attending: Orthopedic Surgery | Admitting: Orthopedic Surgery

## 2019-07-31 ENCOUNTER — Encounter (HOSPITAL_COMMUNITY): Payer: Self-pay

## 2019-07-31 DIAGNOSIS — M109 Gout, unspecified: Secondary | ICD-10-CM | POA: Diagnosis not present

## 2019-07-31 DIAGNOSIS — E669 Obesity, unspecified: Secondary | ICD-10-CM | POA: Diagnosis not present

## 2019-07-31 DIAGNOSIS — Z87891 Personal history of nicotine dependence: Secondary | ICD-10-CM | POA: Insufficient documentation

## 2019-07-31 DIAGNOSIS — Z7901 Long term (current) use of anticoagulants: Secondary | ICD-10-CM | POA: Diagnosis not present

## 2019-07-31 DIAGNOSIS — Z79899 Other long term (current) drug therapy: Secondary | ICD-10-CM | POA: Diagnosis not present

## 2019-07-31 DIAGNOSIS — Z6830 Body mass index (BMI) 30.0-30.9, adult: Secondary | ICD-10-CM | POA: Diagnosis not present

## 2019-07-31 DIAGNOSIS — I509 Heart failure, unspecified: Secondary | ICD-10-CM | POA: Diagnosis not present

## 2019-07-31 DIAGNOSIS — M1711 Unilateral primary osteoarthritis, right knee: Secondary | ICD-10-CM | POA: Insufficient documentation

## 2019-07-31 DIAGNOSIS — E785 Hyperlipidemia, unspecified: Secondary | ICD-10-CM | POA: Diagnosis not present

## 2019-07-31 DIAGNOSIS — Z01812 Encounter for preprocedural laboratory examination: Secondary | ICD-10-CM | POA: Insufficient documentation

## 2019-07-31 DIAGNOSIS — Z7984 Long term (current) use of oral hypoglycemic drugs: Secondary | ICD-10-CM | POA: Diagnosis not present

## 2019-07-31 DIAGNOSIS — E118 Type 2 diabetes mellitus with unspecified complications: Secondary | ICD-10-CM | POA: Insufficient documentation

## 2019-07-31 HISTORY — DX: Nontoxic goiter, unspecified: E04.9

## 2019-07-31 HISTORY — DX: Angina pectoris, unspecified: I20.9

## 2019-07-31 LAB — BASIC METABOLIC PANEL
Anion gap: 12 (ref 5–15)
BUN: 20 mg/dL (ref 8–23)
CO2: 29 mmol/L (ref 22–32)
Calcium: 9.4 mg/dL (ref 8.9–10.3)
Chloride: 101 mmol/L (ref 98–111)
Creatinine, Ser: 0.76 mg/dL (ref 0.44–1.00)
GFR calc Af Amer: >60 mL/min (ref >60)
GFR calc non Af Amer: >60 mL/min (ref >60)
Glucose, Bld: 135 mg/dL — ABNORMAL HIGH (ref 70–99)
Potassium: 4.5 mmol/L (ref 3.5–5.1)
Sodium: 142 mmol/L (ref 135–145)

## 2019-07-31 LAB — CBC
HCT: 40.6 % (ref 36.0–46.0)
Hemoglobin: 13.3 g/dL (ref 12.0–15.0)
MCH: 30.9 pg (ref 26.0–34.0)
MCHC: 32.8 g/dL (ref 30.0–36.0)
MCV: 94.2 fL (ref 80.0–100.0)
Platelets: 294 10*3/uL (ref 150–400)
RBC: 4.31 MIL/uL (ref 3.87–5.11)
RDW: 13.2 % (ref 11.5–15.5)
WBC: 8.8 10*3/uL (ref 4.0–10.5)
nRBC: 0 % (ref 0.0–0.2)

## 2019-07-31 LAB — HEMOGLOBIN A1C
Hgb A1c MFr Bld: 6.9 % — ABNORMAL HIGH (ref 4.8–5.6)
Mean Plasma Glucose: 151.33 mg/dL

## 2019-07-31 LAB — SURGICAL PCR SCREEN
MRSA, PCR: NEGATIVE
Staphylococcus aureus: POSITIVE — AB

## 2019-07-31 NOTE — Progress Notes (Signed)
COVID Vaccine Completed:Yes Date COVID Vaccine completed:03/14/19 COVID vaccine manufacturer: Pfizer     PCP - Dr. Deland Pretty Cardiologist - Dr. Wynonia Lawman. Pt has not seen a cardiologist since he retired  Chest x-ray - 12/08/19 EKG - 07/02/19 Stress Test - no ECHO - no Cardiac Cath - 2015  Sleep Study - no CPAP -   Fasting Blood Sugar - 155-165 Checks Blood Sugar __QD___ times a day   Blood Thinner Instructions:NA Aspirin Instructions: Last Dose:  Anesthesia review:   Patient denies shortness of breath, fever, cough and chest pain at PAT appointment yes   Patient verbalized understanding of instructions that were given to them at the PAT appointment. Patient was also instructed that they will need to review over the PAT instructions again at home before surgery. Yes  Pt was walking a mile a day before she had knee pain. She has lost 10 lbs. She reports SOB after 2 flights of stairs but none with housework or ADLs.   In the 1990s she was taking zoloft for migraines and developed CHF after 1 week.  The cardiologist said that maybe it was a virus that "attacked the heart" Pt had a cardiac cath in 2015 report on chart

## 2019-08-02 NOTE — Progress Notes (Signed)
Anesthesia Chart Review   Case: [id number] Date/Time: 08/07/19 0700   Procedure: TOTAL KNEE ARTHROPLASTY (Right Knee) - 70 mins   Anesthesia type: Spinal   Pre-op diagnosis: Right knee osteoarthritis   Location: [facility] ROOM 09 / [facility]   Surgeons: Paralee Cancel, MD      DISCUSSION:74 y.o. former smoker (8 pack years, quit 11/10/77) with h/o GERD, HLD, DM II (A1C 6.9 07/31/2019), CHF (EF 60%), right knee OA scheduled for above procedure 07/31/2019 with Dr. Paralee Cancel.   Pt seen by PCP 07/02/2019 for preoperative evaluation.  Pt with h/o CHF 1998 with negative cath in 2005.  EF 60% on Echo 01/23/2014.  Dr. Wynonia Lawman released back to primary care.  Per Dr. Dema Severin, "Pt is stable for her knee replacement surgery.  Sugars are up slightly since she isnot exercising as consistently.  We'll repeat A1C today.  If elevated, will increase metformin.  Blood pressure is under good control at home.  We'll continue current medications.  She may stop her aspirin 7 days prior to surgery."  Anticipate pt can proceed with planned procedure barring acute status change.   VS: BP (!) 177/70   Pulse 73   Temp 37 C   Resp 16   Ht 5\' 1"  (1.549 m)   Wt 78.9 kg   SpO2 97%   BMI 32.88 kg/m   PROVIDERS: Harlan Stains, MD is PCP    LABS: Labs reviewed: Acceptable for surgery. (all labs ordered are listed, but only abnormal results are displayed)  Labs Reviewed  SURGICAL PCR SCREEN - Abnormal; Notable for the following components:      Result Value   Staphylococcus aureus POSITIVE (*)    All other components within normal limits  BASIC METABOLIC PANEL - Abnormal; Notable for the following components:   Glucose, Bld 135 (*)    All other components within normal limits  HEMOGLOBIN A1C - Abnormal; Notable for the following components:   Hgb A1c MFr Bld 6.9 (*)    All other components within normal limits  CBC  TYPE AND SCREEN     IMAGES:   EKG: 07/02/19 Rate 69 bpm  Sinus rhythm   CV: Echo 01/23/2014 (on  chart) Conclusion:  1. Mild concentric LVH with normal LV systolic function EF 26%.  2. Doppler evidence of grade 1 diastolic dysfunction 3.  Trace mitral and pulmonic regurgitation Past Medical History:  Diagnosis Date  . Anginal pain ([place])   . Arthritis    Hands, Knees hips, feet,   . CHF (congestive heart failure) ([place]) 1990s   pt feels it is from Zoloft or a virus  . Essential hypertension 01/08/2014  . GERD (gastroesophageal reflux disease)   . Goiter   . Gout   . Hyperlipidemia 01/08/2014  . Obesity (BMI 30-39.9) 01/08/2014  . Type 2 diabetes mellitus without complication ([place]) 33/35/4562    Past Surgical History:  Procedure Laterality Date  . CARDIAC SURGERY    . DIAGNOSTIC LAPAROSCOPY    . EYE SURGERY     bilateral  RK 10 yrs ago  . left and right knee arthroscopies- left 6 months ago    . LEFT HEART CATHETERIZATION WITH CORONARY ANGIOGRAM N/A 01/10/2014   Procedure: LEFT HEART CATHETERIZATION WITH CORONARY ANGIOGRAM;  Surgeon: Jacolyn Reedy, MD;  Location: [facility] CATH LAB;  Service: Cardiovascular;  Laterality: N/A;  . removal of bladder tumor    . TOTAL KNEE ARTHROPLASTY  11/16/2010   Procedure: TOTAL KNEE ARTHROPLASTY;  Surgeon: Mauri Pole;  Location: [facility];  Service: Orthopedics;  Laterality: Left;  . TUBAL LIGATION      MEDICATIONS: . albuterol (VENTOLIN HFA) 108 (90 Base) MCG/ACT inhaler  . allopurinol (ZYLOPRIM) 300 MG tablet  . BIOTIN PO  . Cholecalciferol (VITAMIN D3 PO)  . Cyanocobalamin (B-12 PO)  . fluticasone (FLONASE) 50 MCG/ACT nasal spray  . gabapentin (NEURONTIN) 100 MG capsule  . gabapentin (NEURONTIN) 300 MG capsule  . GLUCOSAMINE HCL PO  . hydrochlorothiazide (HYDRODIURIL) 25 MG tablet  . MAGNESIUM GLYCINATE PO  . metFORMIN (GLUCOPHAGE-XR) 500 MG 24 hr tablet  . metoprolol succinate (TOPROL-XL) 25 MG 24 hr tablet  . Multiple Vitamins-Minerals (CENTRUM WOMEN PO)  . olmesartan (BENICAR) 40 MG tablet  . Omega-3 Fatty Acids (OMEGA 3 PO)   . pravastatin (PRAVACHOL) 80 MG tablet  . predniSONE (STERAPRED UNI-PAK 21 TAB) 10 MG (21) TBPK tablet  . TURMERIC PO   No current facility-administered medications for this encounter.    Konrad Felix, PA-C [facility]Surgical Testing [phone]

## 2019-08-02 NOTE — Anesthesia Preprocedure Evaluation (Addendum)
Anesthesia Evaluation  Patient identified by MRN, date of birth, ID band Patient awake    Reviewed: Allergy & Precautions, NPO status , Patient's Chart, lab work & pertinent test results, reviewed documented beta blocker date and time   Airway Mallampati: II  TM Distance: >3 FB Neck ROM: Full    Dental no notable dental hx. (+) Teeth Intact   Pulmonary shortness of breath and with exertion, former smoker,    Pulmonary exam normal breath sounds clear to auscultation       Cardiovascular hypertension, Pt. on medications and Pt. on home beta blockers + angina +CHF  Normal cardiovascular exam Rhythm:Regular Rate:Normal     Neuro/Psych negative neurological ROS  negative psych ROS   GI/Hepatic Neg liver ROS, GERD  Medicated and Controlled,  Endo/Other  diabetes, Well Controlled, Type 2, Oral Hypoglycemic AgentsHyperlipidemia Obesity  Renal/GU   negative genitourinary   Musculoskeletal  (+) Arthritis , Osteoarthritis,  OA right knee   Abdominal (+) + obese,   Peds  Hematology negative hematology ROS (+)   Anesthesia Other Findings   Reproductive/Obstetrics                           Anesthesia Physical Anesthesia Plan  ASA: III  Anesthesia Plan: Spinal   Post-op Pain Management:  Regional for Post-op pain   Induction:   PONV Risk Score and Plan: 3 and Ondansetron, Treatment may vary due to age or medical condition and Propofol infusion  Airway Management Planned: Natural Airway  Additional Equipment:   Intra-op Plan:   Post-operative Plan:   Informed Consent: I have reviewed the patients History and Physical, chart, labs and discussed the procedure including the risks, benefits and alternatives for the proposed anesthesia with the patient or authorized representative who has indicated his/her understanding and acceptance.     Dental advisory given  Plan Discussed with: CRNA and  Anesthesiologist  Anesthesia Plan Comments: (See PAT note 07/31/2019, Konrad Felix, PA-C)      Anesthesia Quick Evaluation

## 2019-08-03 ENCOUNTER — Other Ambulatory Visit (HOSPITAL_COMMUNITY)
Admission: RE | Admit: 2019-08-03 | Discharge: 2019-08-03 | Disposition: A | Discharge status: Home / Self Care | Payer: Medicare HMO | Source: Ambulatory Visit | Attending: Orthopedic Surgery | Admitting: Orthopedic Surgery

## 2019-08-03 DIAGNOSIS — Z01812 Encounter for preprocedural laboratory examination: Secondary | ICD-10-CM | POA: Diagnosis not present

## 2019-08-03 DIAGNOSIS — Z20822 Contact with and (suspected) exposure to covid-19: Secondary | ICD-10-CM | POA: Diagnosis not present

## 2019-08-03 LAB — SARS CORONAVIRUS 2 (TAT 6-24 HRS): SARS Coronavirus 2: NEGATIVE

## 2019-08-06 ENCOUNTER — Encounter (HOSPITAL_COMMUNITY): Payer: Self-pay | Admitting: Orthopedic Surgery

## 2019-08-07 ENCOUNTER — Encounter (HOSPITAL_COMMUNITY): Payer: Self-pay | Admitting: Orthopedic Surgery

## 2019-08-07 ENCOUNTER — Ambulatory Visit (HOSPITAL_COMMUNITY): Payer: Medicare HMO | Admitting: Anesthesiology

## 2019-08-07 ENCOUNTER — Encounter (HOSPITAL_COMMUNITY)
Admission: RE | Disposition: A | Discharge status: Home / Self Care | Payer: Self-pay | Source: Home / Self Care | Attending: Orthopedic Surgery

## 2019-08-07 ENCOUNTER — Ambulatory Visit (HOSPITAL_COMMUNITY): Payer: Medicare HMO | Admitting: Physician Assistant

## 2019-08-07 ENCOUNTER — Other Ambulatory Visit: Payer: Self-pay

## 2019-08-07 ENCOUNTER — Observation Stay (HOSPITAL_COMMUNITY)
Admission: RE | Admit: 2019-08-07 | Discharge: 2019-08-08 | Disposition: A | Discharge status: Home / Self Care | Payer: Medicare HMO | Attending: Orthopedic Surgery | Admitting: Orthopedic Surgery

## 2019-08-07 DIAGNOSIS — M25761 Osteophyte, right knee: Secondary | ICD-10-CM | POA: Diagnosis not present

## 2019-08-07 DIAGNOSIS — M1711 Unilateral primary osteoarthritis, right knee: Principal | ICD-10-CM | POA: Diagnosis present

## 2019-08-07 DIAGNOSIS — M25561 Pain in right knee: Secondary | ICD-10-CM | POA: Diagnosis present

## 2019-08-07 DIAGNOSIS — I509 Heart failure, unspecified: Secondary | ICD-10-CM | POA: Diagnosis not present

## 2019-08-07 DIAGNOSIS — I110 Hypertensive heart disease with heart failure: Secondary | ICD-10-CM | POA: Diagnosis not present

## 2019-08-07 DIAGNOSIS — Z87891 Personal history of nicotine dependence: Secondary | ICD-10-CM | POA: Diagnosis not present

## 2019-08-07 DIAGNOSIS — E119 Type 2 diabetes mellitus without complications: Secondary | ICD-10-CM | POA: Diagnosis not present

## 2019-08-07 DIAGNOSIS — Z7984 Long term (current) use of oral hypoglycemic drugs: Secondary | ICD-10-CM | POA: Insufficient documentation

## 2019-08-07 DIAGNOSIS — E669 Obesity, unspecified: Secondary | ICD-10-CM | POA: Diagnosis present

## 2019-08-07 DIAGNOSIS — Z96651 Presence of right artificial knee joint: Secondary | ICD-10-CM

## 2019-08-07 DIAGNOSIS — G8918 Other acute postprocedural pain: Secondary | ICD-10-CM | POA: Diagnosis not present

## 2019-08-07 DIAGNOSIS — Z96652 Presence of left artificial knee joint: Secondary | ICD-10-CM | POA: Insufficient documentation

## 2019-08-07 DIAGNOSIS — Z79899 Other long term (current) drug therapy: Secondary | ICD-10-CM | POA: Diagnosis not present

## 2019-08-07 DIAGNOSIS — M25461 Effusion, right knee: Secondary | ICD-10-CM | POA: Diagnosis not present

## 2019-08-07 HISTORY — PX: TOTAL KNEE ARTHROPLASTY: SHX125

## 2019-08-07 LAB — GLUCOSE, CAPILLARY
Glucose-Capillary: 177 mg/dL — ABNORMAL HIGH (ref 70–99)
Glucose-Capillary: 166 mg/dL — ABNORMAL HIGH (ref 70–99)
Glucose-Capillary: 208 mg/dL — ABNORMAL HIGH (ref 70–99)
Glucose-Capillary: 187 mg/dL — ABNORMAL HIGH (ref 70–99)
Glucose-Capillary: 152 mg/dL — ABNORMAL HIGH (ref 70–99)

## 2019-08-07 LAB — TYPE AND SCREEN
ABO/RH(D): B POS
Antibody Screen: NEGATIVE

## 2019-08-07 SURGERY — ARTHROPLASTY, KNEE, TOTAL
Anesthesia: Spinal | Site: Knee | Laterality: Right

## 2019-08-07 MED ORDER — TRANEXAMIC ACID-NACL 1000-0.7 MG/100ML-% IV SOLN
1000.0000 mg | Freq: Once | INTRAVENOUS | Status: AC
  Administered 2019-08-07: 1000 mg via INTRAVENOUS
  Filled 2019-08-07: qty 100

## 2019-08-07 MED ORDER — PROPOFOL 500 MG/50ML IV EMUL
INTRAVENOUS | Status: DC | PRN
  Administered 2019-08-07: 75 ug/kg/min via INTRAVENOUS

## 2019-08-07 MED ORDER — METOCLOPRAMIDE HCL 5 MG PO TABS: 5.0000 mg | ORAL_TABLET | Freq: Three times a day (TID) | ORAL | Status: DC | PRN

## 2019-08-07 MED ORDER — BUPIVACAINE IN DEXTROSE 0.75-8.25 % IT SOLN
INTRATHECAL | Status: DC | PRN
Start: 2019-08-07 — End: 2019-08-07
  Administered 2019-08-07: 1.6 mL via INTRATHECAL

## 2019-08-07 MED ORDER — ONDANSETRON HCL 4 MG/2ML IJ SOLN: 4.0000 mg | Freq: Four times a day (QID) | INTRAMUSCULAR | Status: DC | PRN

## 2019-08-07 MED ORDER — ONDANSETRON HCL 4 MG/2ML IJ SOLN
INTRAMUSCULAR | Status: DC | PRN
  Administered 2019-08-07: 4 mg via INTRAVENOUS

## 2019-08-07 MED ORDER — FERROUS SULFATE 325 (65 FE) MG PO TABS
325.0000 mg | ORAL_TABLET | Freq: Two times a day (BID) | ORAL | Status: DC
  Administered 2019-08-07 – 2019-08-08 (×2): 325 mg via ORAL
  Filled 2019-08-07 (×2): qty 1

## 2019-08-07 MED ORDER — CEFAZOLIN SODIUM-DEXTROSE 2-4 GM/100ML-% IV SOLN
2.0000 g | Freq: Four times a day (QID) | INTRAVENOUS | Status: AC
  Administered 2019-08-07 (×2): 2 g via INTRAVENOUS
  Filled 2019-08-07 (×2): qty 100

## 2019-08-07 MED ORDER — ASPIRIN 81 MG PO CHEW
81.0000 mg | CHEWABLE_TABLET | Freq: Two times a day (BID) | ORAL | Status: DC
  Administered 2019-08-07 – 2019-08-08 (×2): 81 mg via ORAL
  Filled 2019-08-07 (×2): qty 1

## 2019-08-07 MED ORDER — METHOCARBAMOL 500 MG IVPB - SIMPLE MED
500.0000 mg | Freq: Four times a day (QID) | INTRAVENOUS | Status: DC | PRN
  Filled 2019-08-07: qty 50

## 2019-08-07 MED ORDER — HYDROMORPHONE HCL 1 MG/ML IJ SOLN
0.5000 mg | INTRAMUSCULAR | Status: DC | PRN
  Administered 2019-08-07: 0.5 mg via INTRAVENOUS
  Filled 2019-08-07: qty 1

## 2019-08-07 MED ORDER — METHOCARBAMOL 500 MG PO TABS
500.0000 mg | ORAL_TABLET | Freq: Four times a day (QID) | ORAL | Status: DC | PRN
  Administered 2019-08-07 – 2019-08-08 (×3): 500 mg via ORAL
  Filled 2019-08-07 (×3): qty 1

## 2019-08-07 MED ORDER — PHENOL 1.4 % MT LIQD: 1.0000 | OROMUCOSAL | Status: DC | PRN

## 2019-08-07 MED ORDER — KETOROLAC TROMETHAMINE 30 MG/ML IJ SOLN
INTRAMUSCULAR | Status: DC | PRN
  Administered 2019-08-07: 30 mg

## 2019-08-07 MED ORDER — CHLORHEXIDINE GLUCONATE 0.12 % MT SOLN
15.0000 mL | Freq: Once | OROMUCOSAL | Status: AC
  Administered 2019-08-07: 15 mL via OROMUCOSAL

## 2019-08-07 MED ORDER — PHENYLEPHRINE HCL-NACL 10-0.9 MG/250ML-% IV SOLN
INTRAVENOUS | Status: AC
  Filled 2019-08-07: qty 750

## 2019-08-07 MED ORDER — OXYCODONE HCL 5 MG/5ML PO SOLN: 5.0000 mg | Freq: Once | ORAL | Status: DC | PRN

## 2019-08-07 MED ORDER — PRAVASTATIN SODIUM 40 MG PO TABS
80.0000 mg | ORAL_TABLET | Freq: Every day | ORAL | Status: DC
  Administered 2019-08-07: 80 mg via ORAL
  Filled 2019-08-07: qty 2
  Filled 2019-08-07: qty 4

## 2019-08-07 MED ORDER — BISACODYL 10 MG RE SUPP: 10.0000 mg | Freq: Every day | RECTAL | Status: DC | PRN

## 2019-08-07 MED ORDER — FENTANYL CITRATE (PF) 100 MCG/2ML IJ SOLN: 25.0000 ug | INTRAMUSCULAR | Status: DC | PRN

## 2019-08-07 MED ORDER — DEXAMETHASONE SODIUM PHOSPHATE 10 MG/ML IJ SOLN
10.0000 mg | Freq: Once | INTRAMUSCULAR | Status: AC
  Administered 2019-08-08: 10 mg via INTRAVENOUS
  Filled 2019-08-07: qty 1

## 2019-08-07 MED ORDER — ACETAMINOPHEN 325 MG PO TABS: 325.0000 mg | ORAL_TABLET | Freq: Four times a day (QID) | ORAL | Status: DC | PRN

## 2019-08-07 MED ORDER — METOCLOPRAMIDE HCL 5 MG/ML IJ SOLN: 5.0000 mg | Freq: Three times a day (TID) | INTRAMUSCULAR | Status: DC | PRN

## 2019-08-07 MED ORDER — SODIUM CHLORIDE 0.9 % IV SOLN: INTRAVENOUS | Status: DC

## 2019-08-07 MED ORDER — DOCUSATE SODIUM 100 MG PO CAPS
100.0000 mg | ORAL_CAPSULE | Freq: Two times a day (BID) | ORAL | Status: DC
  Administered 2019-08-07 – 2019-08-08 (×2): 100 mg via ORAL
  Filled 2019-08-07 (×3): qty 1

## 2019-08-07 MED ORDER — POVIDONE-IODINE 10 % EX SWAB
2.0000 "application " | Freq: Once | CUTANEOUS | Status: AC
  Administered 2019-08-07: 2 via TOPICAL

## 2019-08-07 MED ORDER — INSULIN ASPART 100 UNIT/ML ~~LOC~~ SOLN
0.0000 [IU] | Freq: Three times a day (TID) | SUBCUTANEOUS | Status: DC
  Administered 2019-08-07: 3 [IU] via SUBCUTANEOUS
  Administered 2019-08-07: 5 [IU] via SUBCUTANEOUS
  Administered 2019-08-08: 2 [IU] via SUBCUTANEOUS

## 2019-08-07 MED ORDER — KETOROLAC TROMETHAMINE 30 MG/ML IJ SOLN
INTRAMUSCULAR | Status: AC
  Filled 2019-08-07: qty 1

## 2019-08-07 MED ORDER — MAGNESIUM CITRATE PO SOLN: 1.0000 | Freq: Once | ORAL | Status: DC | PRN

## 2019-08-07 MED ORDER — MEPERIDINE HCL 50 MG/ML IJ SOLN: 6.2500 mg | INTRAMUSCULAR | Status: DC | PRN

## 2019-08-07 MED ORDER — ALUM & MAG HYDROXIDE-SIMETH 200-200-20 MG/5ML PO SUSP: 15.0000 mL | ORAL | Status: DC | PRN

## 2019-08-07 MED ORDER — IRBESARTAN 300 MG PO TABS
300.0000 mg | ORAL_TABLET | Freq: Every day | ORAL | Status: DC
  Administered 2019-08-07 – 2019-08-08 (×2): 300 mg via ORAL
  Filled 2019-08-07 (×2): qty 1
  Filled 2019-08-07 (×2): qty 2

## 2019-08-07 MED ORDER — ONDANSETRON HCL 4 MG/2ML IJ SOLN: 4.0000 mg | Freq: Once | INTRAMUSCULAR | Status: DC | PRN

## 2019-08-07 MED ORDER — DIPHENHYDRAMINE HCL 12.5 MG/5ML PO ELIX: 12.5000 mg | ORAL_SOLUTION | ORAL | Status: DC | PRN

## 2019-08-07 MED ORDER — GABAPENTIN 100 MG PO CAPS
200.0000 mg | ORAL_CAPSULE | Freq: Every day | ORAL | Status: DC
  Administered 2019-08-07: 200 mg via ORAL
  Filled 2019-08-07: qty 2

## 2019-08-07 MED ORDER — PROPOFOL 10 MG/ML IV BOLUS
INTRAVENOUS | Status: DC | PRN
  Administered 2019-08-07 (×2): 10 mg via INTRAVENOUS

## 2019-08-07 MED ORDER — MENTHOL 3 MG MT LOZG: 1.0000 | LOZENGE | OROMUCOSAL | Status: DC | PRN

## 2019-08-07 MED ORDER — ROPIVACAINE HCL 7.5 MG/ML IJ SOLN
INTRAMUSCULAR | Status: DC | PRN
Start: 2019-08-07 — End: 2019-08-07
  Administered 2019-08-07: 20 mL via PERINEURAL

## 2019-08-07 MED ORDER — DEXAMETHASONE SODIUM PHOSPHATE 10 MG/ML IJ SOLN
INTRAMUSCULAR | Status: AC
  Filled 2019-08-07: qty 3

## 2019-08-07 MED ORDER — ONDANSETRON HCL 4 MG/2ML IJ SOLN
INTRAMUSCULAR | Status: AC
  Filled 2019-08-07: qty 6

## 2019-08-07 MED ORDER — SODIUM CHLORIDE (PF) 0.9 % IJ SOLN
INTRAMUSCULAR | Status: DC | PRN
  Administered 2019-08-07: 30 mL

## 2019-08-07 MED ORDER — PHENYLEPHRINE HCL-NACL 10-0.9 MG/250ML-% IV SOLN
INTRAVENOUS | Status: DC | PRN
  Administered 2019-08-07: 15 ug/min via INTRAVENOUS

## 2019-08-07 MED ORDER — FENTANYL CITRATE (PF) 100 MCG/2ML IJ SOLN
INTRAMUSCULAR | Status: DC | PRN
  Administered 2019-08-07: 50 ug via INTRAVENOUS
  Administered 2019-08-07 (×2): 25 ug via INTRAVENOUS

## 2019-08-07 MED ORDER — ORAL CARE MOUTH RINSE: 15.0000 mL | Freq: Once | OROMUCOSAL | Status: AC

## 2019-08-07 MED ORDER — DEXAMETHASONE SODIUM PHOSPHATE 10 MG/ML IJ SOLN
10.0000 mg | Freq: Once | INTRAMUSCULAR | Status: AC
  Administered 2019-08-07: 8 mg via INTRAVENOUS

## 2019-08-07 MED ORDER — LACTATED RINGERS IV SOLN: INTRAVENOUS | Status: DC

## 2019-08-07 MED ORDER — HYDROCODONE-ACETAMINOPHEN 7.5-325 MG PO TABS
1.0000 | ORAL_TABLET | ORAL | Status: DC | PRN
  Administered 2019-08-07 – 2019-08-08 (×4): 2 via ORAL
  Filled 2019-08-07 (×4): qty 2

## 2019-08-07 MED ORDER — METOPROLOL SUCCINATE ER 25 MG PO TB24
25.0000 mg | ORAL_TABLET | Freq: Every day | ORAL | Status: DC
  Administered 2019-08-08: 25 mg via ORAL
  Filled 2019-08-07: qty 1

## 2019-08-07 MED ORDER — BUPIVACAINE-EPINEPHRINE (PF) 0.25% -1:200000 IJ SOLN
INTRAMUSCULAR | Status: AC
  Filled 2019-08-07: qty 30

## 2019-08-07 MED ORDER — TRANEXAMIC ACID-NACL 1000-0.7 MG/100ML-% IV SOLN
1000.0000 mg | INTRAVENOUS | Status: AC
  Administered 2019-08-07: 1000 mg via INTRAVENOUS
  Filled 2019-08-07: qty 100

## 2019-08-07 MED ORDER — ALLOPURINOL 300 MG PO TABS
300.0000 mg | ORAL_TABLET | Freq: Every day | ORAL | Status: DC
  Administered 2019-08-07: 300 mg via ORAL
  Filled 2019-08-07: qty 1

## 2019-08-07 MED ORDER — METFORMIN HCL ER 500 MG PO TB24
1000.0000 mg | ORAL_TABLET | Freq: Two times a day (BID) | ORAL | Status: DC
  Administered 2019-08-07: 1000 mg via ORAL
  Administered 2019-08-08: 500 mg via ORAL
  Filled 2019-08-07 (×2): qty 2

## 2019-08-07 MED ORDER — LIDOCAINE 2% (20 MG/ML) 5 ML SYRINGE
INTRAMUSCULAR | Status: DC | PRN
  Administered 2019-08-07: 60 mg via INTRAVENOUS

## 2019-08-07 MED ORDER — MIDAZOLAM HCL 5 MG/5ML IJ SOLN
INTRAMUSCULAR | Status: DC | PRN
  Administered 2019-08-07 (×2): 1 mg via INTRAVENOUS

## 2019-08-07 MED ORDER — BUPIVACAINE-EPINEPHRINE (PF) 0.25% -1:200000 IJ SOLN
INTRAMUSCULAR | Status: DC | PRN
  Administered 2019-08-07: 30 mL

## 2019-08-07 MED ORDER — OXYCODONE HCL 5 MG PO TABS: 5.0000 mg | ORAL_TABLET | Freq: Once | ORAL | Status: DC | PRN

## 2019-08-07 MED ORDER — PROPOFOL 10 MG/ML IV BOLUS
INTRAVENOUS | Status: AC
  Filled 2019-08-07: qty 40

## 2019-08-07 MED ORDER — MIDAZOLAM HCL 2 MG/2ML IJ SOLN
INTRAMUSCULAR | Status: AC
  Filled 2019-08-07: qty 2

## 2019-08-07 MED ORDER — FENTANYL CITRATE (PF) 100 MCG/2ML IJ SOLN
INTRAMUSCULAR | Status: AC
  Filled 2019-08-07: qty 2

## 2019-08-07 MED ORDER — LIDOCAINE 2% (20 MG/ML) 5 ML SYRINGE
INTRAMUSCULAR | Status: AC
  Filled 2019-08-07: qty 15

## 2019-08-07 MED ORDER — HYDROCODONE-ACETAMINOPHEN 5-325 MG PO TABS
1.0000 | ORAL_TABLET | ORAL | Status: DC | PRN
  Administered 2019-08-07: 2 via ORAL
  Filled 2019-08-07 (×2): qty 2

## 2019-08-07 MED ORDER — SODIUM CHLORIDE 0.9 % IR SOLN
Status: DC | PRN
  Administered 2019-08-07: 1000 mL

## 2019-08-07 MED ORDER — ONDANSETRON HCL 4 MG PO TABS: 4.0000 mg | ORAL_TABLET | Freq: Four times a day (QID) | ORAL | Status: DC | PRN

## 2019-08-07 MED ORDER — CEFAZOLIN SODIUM-DEXTROSE 2-4 GM/100ML-% IV SOLN
2.0000 g | INTRAVENOUS | Status: AC
  Administered 2019-08-07: 2 g via INTRAVENOUS
  Filled 2019-08-07: qty 100

## 2019-08-07 MED ORDER — HYDROCHLOROTHIAZIDE 25 MG PO TABS
25.0000 mg | ORAL_TABLET | Freq: Every day | ORAL | Status: DC
  Administered 2019-08-07 – 2019-08-08 (×2): 25 mg via ORAL
  Filled 2019-08-07 (×2): qty 1

## 2019-08-07 MED ORDER — POLYETHYLENE GLYCOL 3350 17 G PO PACK
17.0000 g | PACK | Freq: Two times a day (BID) | ORAL | Status: DC
  Administered 2019-08-07: 17 g via ORAL
  Filled 2019-08-07 (×2): qty 1

## 2019-08-07 SURGICAL SUPPLY — 68 items
ADH SKN CLS APL DERMABOND .7 (GAUZE/BANDAGES/DRESSINGS) ×1
ATTUNE MED ANAT PAT 35 KNEE (Knees) ×1 IMPLANT
ATTUNE MED ANAT PAT 35MM KNEE (Knees) ×1 IMPLANT
ATTUNE PSFEM RTSZ3 NARCEM KNEE (Femur) ×2 IMPLANT
ATTUNE PSRP INSR SZ3 6 KNEE (Insert) ×1 IMPLANT
ATTUNE PSRP INSR SZ3 6MM KNEE (Insert) ×1 IMPLANT
BAG SPEC THK2 15X12 ZIP CLS (MISCELLANEOUS)
BAG ZIPLOCK 12X15 (MISCELLANEOUS) IMPLANT
BASE TIBIAL ROT PLAT SZ 2 KNEE (Miscellaneous) IMPLANT
BLADE SAW SGTL 11.0X1.19X90.0M (BLADE) IMPLANT
BLADE SAW SGTL 13.0X1.19X90.0M (BLADE) ×3 IMPLANT
BLADE SURG SZ10 CARB STEEL (BLADE) ×6 IMPLANT
BNDG CMPR MED 10X6 ELC LF (GAUZE/BANDAGES/DRESSINGS) ×1
BNDG ELASTIC 6X10 VLCR STRL LF (GAUZE/BANDAGES/DRESSINGS) ×2 IMPLANT
BNDG ELASTIC 6X5.8 VLCR STR LF (GAUZE/BANDAGES/DRESSINGS) ×3 IMPLANT
BOWL SMART MIX CTS (DISPOSABLE) ×3 IMPLANT
BSPLAT TIB 2 CMNT ROT PLAT STR (Miscellaneous) ×1 IMPLANT
CEMENT HV SMART SET (Cement) ×4 IMPLANT
COVER SURGICAL LIGHT HANDLE (MISCELLANEOUS) ×3 IMPLANT
COVER WAND RF STERILE (DRAPES) IMPLANT
CUFF TOURN SGL QUICK 34 (TOURNIQUET CUFF) ×3
CUFF TRNQT CYL 34X4.125X (TOURNIQUET CUFF) ×1 IMPLANT
DECANTER SPIKE VIAL GLASS SM (MISCELLANEOUS) ×6 IMPLANT
DERMABOND ADVANCED (GAUZE/BANDAGES/DRESSINGS) ×2
DERMABOND ADVANCED .7 DNX12 (GAUZE/BANDAGES/DRESSINGS) ×1 IMPLANT
DRAPE U-SHAPE 47X51 STRL (DRAPES) ×3 IMPLANT
DRESSING AQUACEL AG SP 3.5X10 (GAUZE/BANDAGES/DRESSINGS) ×1 IMPLANT
DRSG AQUACEL AG ADV 3.5X10 (GAUZE/BANDAGES/DRESSINGS) ×2 IMPLANT
DRSG AQUACEL AG SP 3.5X10 (GAUZE/BANDAGES/DRESSINGS) ×3
DURAPREP 26ML APPLICATOR (WOUND CARE) ×6 IMPLANT
ELECT REM PT RETURN 15FT ADLT (MISCELLANEOUS) ×3 IMPLANT
GLOVE BIO SURGEON STRL SZ 6 (GLOVE) ×3 IMPLANT
GLOVE BIOGEL PI IND STRL 6.5 (GLOVE) ×1 IMPLANT
GLOVE BIOGEL PI IND STRL 7.5 (GLOVE) ×1 IMPLANT
GLOVE BIOGEL PI IND STRL 8.5 (GLOVE) ×1 IMPLANT
GLOVE BIOGEL PI INDICATOR 6.5 (GLOVE) ×2
GLOVE BIOGEL PI INDICATOR 7.5 (GLOVE) ×2
GLOVE BIOGEL PI INDICATOR 8.5 (GLOVE) ×2
GLOVE ECLIPSE 8.0 STRL XLNG CF (GLOVE) ×3 IMPLANT
GLOVE ORTHO TXT STRL SZ7.5 (GLOVE) ×3 IMPLANT
GOWN STRL REUS W/ TWL LRG LVL3 (GOWN DISPOSABLE) ×1 IMPLANT
GOWN STRL REUS W/TWL 2XL LVL3 (GOWN DISPOSABLE) ×3 IMPLANT
GOWN STRL REUS W/TWL LRG LVL3 (GOWN DISPOSABLE) ×6 IMPLANT
HANDPIECE INTERPULSE COAX TIP (DISPOSABLE) ×3
HOLDER FOLEY CATH W/STRAP (MISCELLANEOUS) IMPLANT
KIT TURNOVER KIT A (KITS) IMPLANT
MANIFOLD NEPTUNE II (INSTRUMENTS) ×3 IMPLANT
NDL SAFETY ECLIPSE 18X1.5 (NEEDLE) IMPLANT
NEEDLE HYPO 18GX1.5 SHARP (NEEDLE)
NS IRRIG 1000ML POUR BTL (IV SOLUTION) ×3 IMPLANT
PACK TOTAL KNEE CUSTOM (KITS) ×3 IMPLANT
PENCIL SMOKE EVACUATOR (MISCELLANEOUS) IMPLANT
PIN DRILL FIX HALF THREAD (BIT) ×2 IMPLANT
PIN FIX SIGMA LCS THRD HI (PIN) ×2 IMPLANT
PROTECTOR NERVE ULNAR (MISCELLANEOUS) ×3 IMPLANT
SET HNDPC FAN SPRY TIP SCT (DISPOSABLE) ×1 IMPLANT
SET PAD KNEE POSITIONER (MISCELLANEOUS) ×3 IMPLANT
SUT MNCRL AB 4-0 PS2 18 (SUTURE) ×3 IMPLANT
SUT STRATAFIX PDS+ 0 24IN (SUTURE) ×3 IMPLANT
SUT VIC AB 1 CT1 36 (SUTURE) ×3 IMPLANT
SUT VIC AB 2-0 CT1 27 (SUTURE) ×9
SUT VIC AB 2-0 CT1 TAPERPNT 27 (SUTURE) ×3 IMPLANT
SYR 3ML LL SCALE MARK (SYRINGE) ×3 IMPLANT
TIBIAL BASE ROT PLAT SZ 2 KNEE (Miscellaneous) ×3 IMPLANT
TRAY FOLEY MTR SLVR 16FR STAT (SET/KITS/TRAYS/PACK) ×3 IMPLANT
WATER STERILE IRR 1000ML POUR (IV SOLUTION) ×6 IMPLANT
WRAP KNEE MAXI GEL POST OP (GAUZE/BANDAGES/DRESSINGS) ×3 IMPLANT
YANKAUER SUCT BULB TIP 10FT TU (MISCELLANEOUS) ×3 IMPLANT

## 2019-08-07 NOTE — Care Plan (Signed)
Ortho Bundle Case Management Note  Patient Details  Name: Shannon Moore MRN: [id number] Date of Birth: 06-04-1945  R TKA on 08-07-19 DCP:  Home with husband.  DME:  Has a RW.  3-in-1 ordered through [place] PT:  [facility]. PT eval scheduled for 08-10-19.                   DME Arranged:  3-N-1 DME Agency:  [employer]  HH Arranged:  NA [place] Agency:  NA  Additional Comments: Please contact me with any questions of if this plan should need to change.  Marianne Sofia, RN,CCM [facility]  [phone] 08/07/2019, 10:15 AM

## 2019-08-07 NOTE — Discharge Instructions (Signed)

## 2019-08-07 NOTE — Anesthesia Postprocedure Evaluation (Signed)
Anesthesia Post Note  Patient: TZIPPY TESTERMAN  Procedure(s) Performed: TOTAL KNEE ARTHROPLASTY (Right Knee)     Patient location during evaluation: PACU Anesthesia Type: Spinal Level of consciousness: awake and alert and oriented Pain management: pain level controlled Vital Signs Assessment: post-procedure vital signs reviewed and stable Respiratory status: spontaneous breathing, nonlabored ventilation and respiratory function stable Cardiovascular status: blood pressure returned to baseline and stable Postop Assessment: no apparent nausea or vomiting, spinal receding, no headache, no backache and patient able to bend at knees Anesthetic complications: no   No complications documented.  Last Vitals:  Vitals:   08/07/19 1031 08/07/19 1136  BP: 124/71 (!) 131/63  Pulse: 62 66  Resp: 18 16  Temp: 36.5 C 36.7 C  SpO2: 99% 99%    Last Pain:  Vitals:   08/07/19 1024  TempSrc:   PainSc: 0-No pain                 Victory Strollo A.

## 2019-08-07 NOTE — Evaluation (Signed)
Physical Therapy Evaluation Patient Details Name: Shannon Moore MRN: [id number] DOB: [birth year] Today's Date: 08/07/2019   History of Present Illness  Patient is 74 y.o. female s/p Rt TKA on 08/07/19 [place] significant for DM, HTN, HLD, CHF, OA, GERD, gout, obesity, Lt TKA in 2012.    Clinical Impression  Shannon Moore is a 74 y.o. female POD 0 s/p Rt TKA. Patient reports independence with mobility at baseline. Patient is now limited by functional impairments (see PT problem list below) and requires min assist for transfers and gait with RW. Patient was able to ambulate ~60 feet with RW and min assist. Patient instructed in exercise to facilitate ROM and circulation. Patient will benefit from continued skilled PT interventions to address impairments and progress towards PLOF. Acute PT will follow to progress mobility and stair training in preparation for safe discharge home.     Follow Up Recommendations Follow surgeon's recommendation for DC plan and follow-up therapies;Outpatient PT    Equipment Recommendations  Rolling walker with 5" wheels (youth)    Recommendations for Other Services       Precautions / Restrictions Precautions Precautions: Fall Restrictions Weight Bearing Restrictions: No      Mobility  Bed Mobility Overal bed mobility: Needs Assistance Bed Mobility: Supine to Sit     Supine to sit: Min assist;HOB elevated     General bed mobility comments: cues to use bed rail as needed, light assist to raise trunk fully and sit EOB.  Transfers Overall transfer level: Needs assistance Equipment used: Rolling walker (2 wheeled) Transfers: Sit to/from Stand Sit to Stand: Min assist         General transfer comment: VC's for safe technique with RW, light assist to rise from EOB.  Ambulation/Gait Ambulation/Gait assistance: Min assist;Min guard Gait Distance (Feet): 60 Feet Assistive device: Rolling walker (2 wheeled) Gait Pattern/deviations: Step-to  pattern;Decreased stride length;Decreased weight shift to right Gait velocity: decr   General Gait Details: VC's for safe step pattern and proximity to RW. no overt LOB noted. assist intermittently to manage walker position.  Stairs            Wheelchair Mobility    Modified Rankin (Stroke Patients Only)       Balance Overall balance assessment: Needs assistance Sitting-balance support: Feet supported Sitting balance-Leahy Scale: Good     Standing balance support: During functional activity;Bilateral upper extremity supported Standing balance-Leahy Scale: Fair                  Pertinent Vitals/Pain Pain Assessment: 0-10 Pain Score: 4  Pain Location: Rt knee Pain Descriptors / Indicators: Discomfort;Aching;Sore Pain Intervention(s): Limited activity within patient's tolerance;Monitored during session;Repositioned;Patient requesting pain meds-RN notified;Ice applied    Home Living Family/patient expects to be discharged to:: Private residence Living Arrangements: Spouse/significant other;Children Available Help at Discharge: Family Type of Home: House Home Access: Stairs to enter Entrance Stairs-Rails: None Technical brewer of Steps: 1+1 Home Layout: One level Home Equipment: Marine scientist - single point      Prior Function Level of Independence: Independent               Hand Dominance   Dominant Hand: Right    Extremity/Trunk Assessment   Upper Extremity Assessment Upper Extremity Assessment: Overall WFL for tasks assessed    Lower Extremity Assessment Lower Extremity Assessment: RLE deficits/detail RLE Deficits / Details: good quad activation, no extensor lag with SLR RLE Sensation: WNL RLE Coordination: WNL    Cervical / Trunk Assessment  Cervical / Trunk Assessment: Normal  Communication   Communication: No difficulties  Cognition Arousal/Alertness: Awake/alert Behavior During Therapy: WFL for tasks  assessed/performed Overall Cognitive Status: Within Functional Limits for tasks assessed                 General Comments      Exercises Total Joint Exercises Ankle Circles/Pumps: AROM;Both;10 reps;Seated Quad Sets: AROM;Right;10 reps;Seated Heel Slides: AROM;Right;10 reps;Seated   Assessment/Plan    PT Assessment Patient needs continued PT services  PT Problem List Decreased strength;Decreased range of motion;Decreased activity tolerance;Decreased balance;Decreased mobility;Decreased knowledge of use of DME       PT Treatment Interventions DME instruction;Gait training;Stair training;Functional mobility training;Therapeutic activities;Therapeutic exercise;Balance training;Patient/family education    PT Goals (Current goals can be found in the Care Plan section)  Acute Rehab PT Goals Patient Stated Goal[facility] Goal Formulation: With patient Time For Goal Achievement: 08/14/19 Potential to Achieve Goals: Good    Frequency 7X/week    AM-PAC PT "6 Clicks" Mobility  Outcome Measure Help needed turning from your back to your side while in a flat bed without using bedrails?: A Little Help needed moving from lying on your back to sitting on the side of a flat bed without using bedrails?: A Little Help needed moving to and from a bed to a chair (including a wheelchair)?: A Little Help needed standing up from a chair using your arms (e.g., wheelchair or bedside chair)?: A Little Help needed to walk in hospital room?: A Little Help needed climbing 3-5 steps with a railing? : A Little 6 Click Score: 18    End of Session Equipment Utilized During Treatment: Gait belt Activity Tolerance: Patient tolerated treatment well Patient left: in chair;with call bell/phone within reach;with chair alarm set;with family/visitor present Nurse Communication: Mobility status PT Visit Diagnosis: Muscle weakness (generalized) (M62.81)    Time: 1749-4496 PT Time  Calculation (min) (ACUTE ONLY): 23 min   Charges:   PT Evaluation $PT Eval Low Complexity: 1 Low PT Treatments $Therapeutic Exercise: 8-22 mins       Verner Mould, DPT Acute Rehabilitation Services  Office [phone] Pager [phone]  08/07/2019 2:55 PM

## 2019-08-07 NOTE — Anesthesia Procedure Notes (Signed)
Anesthesia Regional Block: Adductor canal block   Pre-Anesthetic Checklist: ,, timeout performed, Correct Patient, Correct Site, Correct Laterality, Correct Procedure, Correct Position, site marked, Risks and benefits discussed,  Surgical consent,  Pre-op evaluation,  At surgeon's request and post-op pain management  Laterality: Right  Prep: chloraprep       Needles:  Injection technique: Single-shot  Needle Type: Echogenic Stimulator Needle     Needle Length: 9cm  Needle Gauge: 21   Needle insertion depth: 7 cm   Additional Needles:   Procedures:,,,, ultrasound used (permanent image in chart),,,,  Narrative:  Start time: 08/07/2019 6:58 AM End time: 08/07/2019 7:03 AM Injection made incrementally with aspirations every 5 mL.  Performed by: Personally  Anesthesiologist: Josephine Igo, MD  Additional Notes: Timeout performed. Patient sedated. Relevant anatomy ID'd using [place]. Incremental 2-74ml injection of LA with frequent aspiration. Patient tolerated procedure well.        Right Adductor Canal Block

## 2019-08-07 NOTE — Op Note (Signed)
NAME:  Shannon Moore                      MEDICAL RECORD NO.:  [id number]                             FACILITY:  [facility]      PHYSICIAN:  Pietro Cassis. Alvan Dame, M.D.  DATE OF BIRTH:  1945-08-22      DATE OF PROCEDURE:  08/07/2019                                     OPERATIVE REPORT         PREOPERATIVE DIAGNOSIS:  Right knee osteoarthritis.      POSTOPERATIVE DIAGNOSIS:  Right knee osteoarthritis.      FINDINGS:  The patient was noted to have complete loss of cartilage and   bone-on-bone arthritis with associated significant peripatellar osteophytes in the patellofemoral and medial compartments of   the knee with a significant synovitis and associated effusion.  The patient had failed months of conservative treatment including medications, injection therapy, activity modification.     PROCEDURE:  Right total knee replacement.      COMPONENTS USED:  DePuy Attune rotating platform posterior stabilized knee   system, a size 3N femur, 2 tibia, size 6 mm PS AOX insert, and 35 anatomic patellar   button.      SURGEON:  Pietro Cassis. Alvan Dame, M.D.      ASSISTANT:  Griffith Citron, PA-C.      ANESTHESIA:  Regional and Spinal.      SPECIMENS:  None.      COMPLICATION:  None.      DRAINS:  None.  EBL: <100 cc      TOURNIQUET TIME:   Total Tourniquet Time Documented: Thigh (Right) - 28 minutes Total: Thigh (Right) - 28 minutes  .      The patient was stable to the recovery room.      INDICATION FOR PROCEDURE:  Shannon Moore is a 74 y.o. female patient of   mine.  The patient had been seen, evaluated, and treated for months conservatively in the   office with medication, activity modification, and injections.  The patient had   radiographic changes of bone-on-bone arthritis with endplate sclerosis and osteophytes noted.  Based on the radiographic changes and failed conservative measures, the patient   decided to proceed with definitive treatment, total knee replacement.  Risks of infection,  DVT, component failure, need for revision surgery, neurovascular injury were reviewed in the office setting.  The postop course was reviewed stressing the efforts to maximize post-operative satisfaction and function.  Consent was obtained for benefit of pain   relief.      PROCEDURE IN DETAIL:  The patient was brought to the operative theater.   Once adequate anesthesia, preoperative antibiotics, 2 gm of Ancef,1 gm of Tranexamic Acid, and 10 mg of Decadron administered, the patient was positioned supine with a right thigh tourniquet placed.  The  right lower extremity was prepped and draped in sterile fashion.  A time-   out was performed identifying the patient, planned procedure, and the appropriate extremity.      The right lower extremity was placed in the [facility] leg holder.  The leg was   exsanguinated, tourniquet elevated to 250 mmHg.  A midline incision was  made followed by median parapatellar arthrotomy.  Following initial   exposure, attention was first directed to the patella.  Precut   measurement was noted to be 20 mm.  I resected down to 13-14 mm and used a   35 anatomic patellar button to restore patellar height as well as cover the cut surface. Osteophytes excised and debrided.     The lug holes were drilled and a metal shim was placed to protect the   patella from retractors and saw blade during the procedure.      At this point, attention was now directed to the femur.  The femoral   canal was opened with a drill, irrigated to try to prevent fat emboli.  An   intramedullary rod was passed at 3 degrees valgus, 9 mm of bone was   resected off the distal femur.  Following this resection, the tibia was   subluxated anteriorly.  Using the extramedullary guide, 2 mm of bone was resected off   the proximal medial tibia.  We confirmed the gap would be   stable medially and laterally with a size 5 spacer block as well as confirmed that the tibial cut was perpendicular in the coronal  plane, checking with an alignment rod.      Once this was done, I sized the femur to be a size 3 in the anterior-   posterior dimension, chose a narrow component based on medial and   lateral dimension.  The size 3 rotation block was then pinned in   position anterior referenced using the C-clamp to set rotation.  The   anterior, posterior, and  chamfer cuts were made without difficulty nor   notching making certain that I was along the anterior cortex to help   with flexion gap stability.      The final box cut was made off the lateral aspect of distal femur.      At this point, the tibia was sized to be a size 2.  The size 2 tray was   then pinned in position through the medial third of the tubercle,   drilled, and keel punched.  Trial reduction was now carried with a 3 femur,  2 tibia, a size 6 mm PS insert, and the 35 anatomic patella botton.  The knee was brought to full extension with good flexion stability with the patella   tracking through the trochlea without application of pressure.  Given   all these findings the trial components removed.  Final components were   opened and cement was mixed.  The knee was irrigated with normal saline solution and pulse lavage.  The synovial lining was   then injected with 30 cc of 0.25% Marcaine with epinephrine, 1 cc of Toradol and 30 cc of NS for a total of 61 cc.     Final implants were then cemented onto cleaned and dried cut surfaces of bone with the knee brought to extension with a size 6 mm PS trial insert.      Once the cement had fully cured, excess cement was removed   throughout the knee.  I confirmed that I was satisfied with the range of   motion and stability, and the final size 6 mm PS AOX insert was chosen.  It was   placed into the knee.      The tourniquet had been let down at 28 minutes.  No significant   hemostasis was required.  The extensor mechanism was then reapproximated  using #1 Vicryl and #1 Stratafix sutures with  the knee   in flexion.  The   remaining wound was closed with 2-0 Vicryl and running 4-0 Monocryl.   The knee was cleaned, dried, dressed sterilely using Dermabond and   Aquacel dressing.  The patient was then   brought to recovery room in stable condition, tolerating the procedure   well.   Please note that Physician Assistant, Griffith Citron, PA-C was present for the entirety of the case, and was utilized for pre-operative positioning, peri-operative retractor management, general facilitation of the procedure and for primary wound closure at the end of the case.              Pietro Cassis Alvan Dame, M.D.    08/07/2019 8:38 AM

## 2019-08-07 NOTE — Anesthesia Procedure Notes (Signed)
Spinal  Start time: 08/07/2019 7:17 AM End time: 08/07/2019 7:22 AM Staffing Performed: anesthesiologist  Anesthesiologist: Josephine Igo, MD Preanesthetic Checklist Completed: patient identified, IV checked, site marked, risks and benefits discussed, surgical consent, monitors and equipment checked, pre-op evaluation and timeout performed Spinal Block Patient position: sitting Prep: DuraPrep Patient monitoring: heart rate and cardiac monitor Approach: midline Location: L3-4 Injection technique: single-shot Needle Needle type: Pencan  Needle gauge: 24 G Needle length: 9 cm Needle insertion depth: 6 cm Assessment Sensory level: T4 Additional Notes Patient tolerated procedure well. Adequate sensory level.

## 2019-08-07 NOTE — Transfer of Care (Signed)
Immediate Anesthesia Transfer of Care Note  Patient: Shannon Moore  Procedure(s) Performed: TOTAL KNEE ARTHROPLASTY (Right Knee)  Patient Location: PACU  Anesthesia Type:Spinal  Level of Consciousness: awake, alert  and patient cooperative  Airway & Oxygen Therapy: Patient Spontanous Breathing and Patient connected to face mask oxygen  Post-op Assessment: Report given to RN and Post -op Vital signs reviewed and stable  Post vital signs: Reviewed and stable  Last Vitals:  Vitals Value Taken Time  BP 113/57 08/07/19 0900  Temp    Pulse 69 08/07/19 0901  Resp 14 08/07/19 0901  SpO2 100 % 08/07/19 0901  Vitals shown include unvalidated device data.  Last Pain:  Vitals:   08/07/19 0641  TempSrc: Oral         Complications: No complications documented.

## 2019-08-07 NOTE — Interval H&P Note (Signed)
History and Physical Interval Note:  08/07/2019 7:02 AM  Shannon Moore  has presented today for surgery, with the diagnosis of Right knee osteoarthritis.  The various methods of treatment have been discussed with the patient and family. After consideration of risks, benefits and other options for treatment, the patient has consented to  Procedure(s) with comments: TOTAL KNEE ARTHROPLASTY (Right) - 70 mins as a surgical intervention.  The patient's history has been reviewed, patient examined, no change in status, stable for surgery.  I have reviewed the patient's chart and labs.  Questions were answered to the patient's satisfaction.     Mauri Pole

## 2019-08-08 ENCOUNTER — Encounter (HOSPITAL_COMMUNITY): Payer: Self-pay | Admitting: Orthopedic Surgery

## 2019-08-08 DIAGNOSIS — M1711 Unilateral primary osteoarthritis, right knee: Secondary | ICD-10-CM | POA: Diagnosis not present

## 2019-08-08 DIAGNOSIS — Z96651 Presence of right artificial knee joint: Secondary | ICD-10-CM | POA: Diagnosis not present

## 2019-08-08 LAB — GLUCOSE, CAPILLARY
Glucose-Capillary: 150 mg/dL — ABNORMAL HIGH (ref 70–99)
Glucose-Capillary: 188 mg/dL — ABNORMAL HIGH (ref 70–99)

## 2019-08-08 LAB — CBC
HCT: 32.4 % — ABNORMAL LOW (ref 36.0–46.0)
Hemoglobin: 10.7 g/dL — ABNORMAL LOW (ref 12.0–15.0)
MCH: 31 pg (ref 26.0–34.0)
MCHC: 33 g/dL (ref 30.0–36.0)
MCV: 93.9 fL (ref 80.0–100.0)
Platelets: 271 10*3/uL (ref 150–400)
RBC: 3.45 MIL/uL — ABNORMAL LOW (ref 3.87–5.11)
RDW: 13.5 % (ref 11.5–15.5)
WBC: 14.3 10*3/uL — ABNORMAL HIGH (ref 4.0–10.5)
nRBC: 0 % (ref 0.0–0.2)

## 2019-08-08 LAB — BASIC METABOLIC PANEL
Anion gap: 14 (ref 5–15)
BUN: 23 mg/dL (ref 8–23)
CO2: 25 mmol/L (ref 22–32)
Calcium: 8.6 mg/dL — ABNORMAL LOW (ref 8.9–10.3)
Chloride: 100 mmol/L (ref 98–111)
Creatinine, Ser: 0.92 mg/dL (ref 0.44–1.00)
GFR calc Af Amer: >60 mL/min (ref >60)
GFR calc non Af Amer: >60 mL/min (ref >60)
Glucose, Bld: 155 mg/dL — ABNORMAL HIGH (ref 70–99)
Potassium: 4.6 mmol/L (ref 3.5–5.1)
Sodium: 139 mmol/L (ref 135–145)

## 2019-08-08 MED ORDER — HYDROCODONE-ACETAMINOPHEN 7.5-325 MG PO TABS: 1.0000 | ORAL_TABLET | ORAL | 0 refills | Status: DC | PRN

## 2019-08-08 MED ORDER — FERROUS SULFATE 325 (65 FE) MG PO TABS: 325.0000 mg | ORAL_TABLET | Freq: Three times a day (TID) | ORAL | 0 refills | Status: DC

## 2019-08-08 MED ORDER — POLYETHYLENE GLYCOL 3350 17 G PO PACK
17.0000 g | PACK | Freq: Two times a day (BID) | ORAL | 0 refills | Status: DC
Start: 2019-08-08 — End: 2021-05-13

## 2019-08-08 MED ORDER — ASPIRIN 81 MG PO CHEW: 81.0000 mg | CHEWABLE_TABLET | Freq: Two times a day (BID) | ORAL | 0 refills | Status: AC

## 2019-08-08 MED ORDER — METHOCARBAMOL 500 MG PO TABS: 500.0000 mg | ORAL_TABLET | Freq: Four times a day (QID) | ORAL | 0 refills | Status: DC | PRN

## 2019-08-08 MED ORDER — DOCUSATE SODIUM 100 MG PO CAPS
100.0000 mg | ORAL_CAPSULE | Freq: Two times a day (BID) | ORAL | 0 refills | Status: DC
Start: 2019-08-08 — End: 2021-05-13

## 2019-08-08 NOTE — Progress Notes (Signed)
Physical Therapy Treatment Patient Details Name: Shannon Moore MRN: [id number] DOB: 09/19/1945 Today's Date: 08/08/2019    History of Present Illness Patient is 74 y.o. female s/p Rt TKA on 08/07/19 [place] significant for DM, HTN, HLD, CHF, OA, GERD, gout, obesity, Lt TKA in 2012.    PT Comments    Progressing well with mobility. Reviewed/practiced exercises, gait training, and stair training. Issued HEP for pt to follow and perform at home until she begins OP PT. All education completed. Okay to d/c from PT standpoint.     Follow Up Recommendations  Follow surgeon's recommendation for DC plan and follow-up therapies     Equipment Recommendations  Rolling walker with 5" wheels (youth)    Recommendations for Other Services       Precautions / Restrictions Precautions Precautions: Fall Restrictions Weight Bearing Restrictions: No Other Position/Activity Restrictions: WBAT    Mobility  Bed Mobility Overal bed mobility: Modified Independent                Transfers Overall transfer level: Needs assistance Equipment used: Rolling walker (2 wheeled) Transfers: Sit to/from Stand Sit to Stand: Supervision            Ambulation/Gait Ambulation/Gait assistance: Supervision Gait Distance (Feet): 100 Feet Assistive device: Rolling walker (2 wheeled) Gait Pattern/deviations: Step-through pattern;Decreased stride length         Stairs Stairs: Yes Stairs assistance: Supervision Stair Management: Forwards;With walker;Step to pattern Number of Stairs: 1 (x2) General stair comments: VCs safety, technique, sequence. Practiced x 2 to tweak performance   Wheelchair Mobility    Modified Rankin (Stroke Patients Only)       Balance                                            Cognition Arousal/Alertness: Awake/alert Behavior During Therapy: WFL for tasks assessed/performed Overall Cognitive Status: Within Functional Limits for tasks  assessed                                        Exercises Total Joint Exercises Ankle Circles/Pumps: AROM;Both;10 reps Quad Sets: AROM;Both;10 reps Straight Leg Raises: AROM;Right;10 reps Knee Flexion: AAROM;Right;5 reps;Seated Goniometric ROM: ~10-80 degrees    General Comments        Pertinent Vitals/Pain Pain Assessment: 0-10 Pain Score: 5  Pain Location: R knee Pain Descriptors / Indicators: Sore;Discomfort Pain Intervention(s): Limited activity within patient's tolerance;Monitored during session    Home Living                      Prior Function            PT Goals (current goals can now be found in the care plan section) Progress towards PT goals: Progressing toward goals    Frequency    7X/week      PT Plan Current plan remains appropriate    Co-evaluation              AM-PAC PT "6 Clicks" Mobility   Outcome Measure  Help needed turning from your back to your side while in a flat bed without using bedrails?: None Help needed moving from lying on your back to sitting on the side of a flat bed without using bedrails?: None Help needed moving to and  from a bed to a chair (including a wheelchair)?: A Little Help needed standing up from a chair using your arms (e.g., wheelchair or bedside chair)?: A Little Help needed to walk in hospital room?: A Little Help needed climbing 3-5 steps with a railing? : A Little 6 Click Score: 20    End of Session Equipment Utilized During Treatment: Gait belt Activity Tolerance: Patient tolerated treatment well Patient left: in bed;with call bell/phone within reach   PT Visit Diagnosis: Pain;Other abnormalities of gait and mobility (R26.89) Pain - Right/Left: Right Pain - part of body: Knee     Time: 9758-8325 PT Time Calculation (min) (ACUTE ONLY): 28 min  Charges:  $Gait Training: 8-22 mins $Therapeutic Exercise: 8-22 mins                        Doreatha Massed, PT Acute  Rehabilitation  Office: [phone] Pager: [phone]

## 2019-08-08 NOTE — TOC Transition Note (Signed)
Transition of Care [facility]) - CM/SW Discharge Note   Patient Details  Name: Shannon Moore MRN: [id number] Date of Birth: Oct 23, 1945  [facility] CM/SW Contact:  Lennart Pall, LCSW Phone Number: 08/08/2019, 11:16 AM   Clinical Narrative:   Met briefly with pt and confirming receipt of youth rw.  Plan for OPPT at [facility].  No further TOC needs.    Final next level of care: OP Rehab Barriers to Discharge: No Barriers Identified   Patient Goals and CMS Choice Patient states their goals for this hospitalization and ongoing recovery are:: go home CMS Medicare.gov Compare Post Acute Care list provided to:: Patient Choice offered to / list presented to : Patient  Discharge Placement                       Discharge Plan and Services                DME Arranged: [facility] youth (does NOT need commode) DME Agency: [employer]     Representative spoke with at DME Agency: Ovid Curd HH Arranged: NA [place] Agency: NA        Social Determinants of Health ([place]) Interventions     Readmission Risk Interventions No flowsheet data found.

## 2019-08-08 NOTE — Progress Notes (Signed)
     Subjective: 1 Day Post-Op Procedure(s) (LRB): TOTAL KNEE ARTHROPLASTY (Right)   Patient reports pain as mild, pain controlled.  No reported events throughout the night.  Dr. Alvan Dame discussed the procedure, findings and expectations moving forward.  Ready to be discharged home, if they do well with PT.  Follow up in the clinic in 2 weeks.  Knows to call with any questions or concerns.      Patient's anticipated LOS is less than 2 midnights, meeting these requirements: - Lives within 1 hour of care - Has a competent adult at home to recover with post-op recover - NO history of  - Chronic pain requiring opiods  - Diabetes  - Coronary Artery Disease  - Heart failure  - Heart attack  - Stroke  - DVT/VTE  - Cardiac arrhythmia  - Respiratory Failure/COPD  - Renal failure  - Anemia  - Advanced Liver disease       Objective:   VITALS:   Vitals:   08/08/19 0211 08/08/19 0604  BP: (!) 133/62 121/67  Pulse: 69 81  Resp: 16 16  Temp: 98.6 F (37 C) 98.3 F (36.8 C)  SpO2: 98% 99%    Dorsiflexion/Plantar flexion intact Incision: dressing C/D/I No cellulitis present Compartment soft  LABS Recent Labs    08/08/19 0249  HGB 10.7*  HCT 32.4*  WBC 14.3*  PLT 271    Recent Labs    08/08/19 0249  NA 139  K 4.6  BUN 23  CREATININE 0.92  GLUCOSE 155*     Assessment/Plan: 1 Day Post-Op Procedure(s) (LRB): TOTAL KNEE ARTHROPLASTY (Right) Foley cath d/c'ed Advance diet Up with therapy D/C IV fluids Discharge home Follow up in 2 weeks at [facility] Follow up with OLIN,Hakim Minniefield D in 2 weeks.  Contact information:  [facility] [address], [address] [place] [phone]    Obese (BMI 30-39.9) Estimated body mass index is 32.87 kg/m as calculated from the following:   Height as of this encounter: 5\' 1"  (1.549 m).   Weight as of this encounter: 78.9 kg. Patient also counseled that weight may inhibit the healing  process Patient counseled that losing weight will help with future health issues      Danae Orleans PA-C  [facility]  [place] [address], [address], [place], [place] [address] Phone: [phone] [email or web address]  [employer]

## 2019-08-08 NOTE — Discharge Summary (Signed)
Patient ID: Shannon Moore MRN: [id number] DOB/AGE: 06-26-1945 74 y.o.  Admit date: 08/07/2019 Discharge date: 08/08/2019  Admission Diagnoses:  Principal Problem:   Right knee OA Active Problems:   Obesity (BMI 30-39.9)   Status post total right knee replacement   Discharge Diagnoses:  Same  Past Medical History:  Diagnosis Date   Anginal pain ([place])    Arthritis    Hands, Knees hips, feet,    CHF (congestive heart failure) ([place]) 1990s   pt feels it is from Zoloft or a virus   Essential hypertension 01/08/2014   GERD (gastroesophageal reflux disease)    Goiter    Gout    Hyperlipidemia 01/08/2014   Obesity (BMI 30-39.9) 01/08/2014   Type 2 diabetes mellitus without complication ([place]) 63/87/5643    Surgeries: Procedure(s):  RIGHT TOTAL KNEE ARTHROPLASTY on 08/07/2019   Consultants: N/A  Discharged Condition: Improved  Hospital Course: Shannon Moore is an 74 y.o. female who was admitted 08/07/2019 for operative treatment ofOsteoarthritis of right knee. Patient has severe unremitting pain that affects sleep, daily activities, and work/hobbies. After pre-op clearance the patient was taken to the operating room on 08/07/2019 and underwent  Procedure(s):  RIGHT TOTAL KNEE ARTHROPLASTY.    Patient was given perioperative antibiotics:  Anti-infectives (From admission, onward)   Start     Dose/Rate Route Frequency Ordered Stop   08/07/19 1330  ceFAZolin (ANCEF) IVPB 2g/100 mL premix        2 g 200 mL/hr over 30 Minutes Intravenous Every 6 hours 08/07/19 1024 08/07/19 2015   08/07/19 0600  ceFAZolin (ANCEF) IVPB 2g/100 mL premix        2 g 200 mL/hr over 30 Minutes Intravenous On call to O.R. 08/07/19 0532 08/07/19 3295       Patient was given sequential compression devices, early ambulation, and chemoprophylaxis to prevent DVT.  Patient benefited maximally from hospital stay and there were no complications.    Recent vital signs:  Patient Vitals for the past  24 hrs:  BP Temp Temp src Pulse Resp SpO2 Height Weight  08/08/19 0604 121/67 98.3 F (36.8 C) Oral 81 16 99 % -- --  08/08/19 0211 (!) 133/62 98.6 F (37 C) Oral 69 16 98 % -- --  08/07/19 2124 (!) 138/64 (!) 97.5 F (36.4 C) Oral 73 18 96 % -- --  08/07/19 1716 (!) 124/58 98.3 F (36.8 C) Oral 71 16 97 % -- --  08/07/19 1340 (!) 132/66 98.5 F (36.9 C) Oral 75 16 95 % -- --  08/07/19 1238 (!) 133/65 98 F (36.7 C) Oral 76 16 96 % -- --  08/07/19 1136 (!) 131/63 98.1 F (36.7 C) -- 66 16 99 % -- --  08/07/19 1031 124/71 97.7 F (36.5 C) -- 62 18 99 % 5\' 1"  (1.549 m) 78.9 kg  08/07/19 1000 (!) 115/63 -- -- 64 (!) 11 97 % -- --  08/07/19 0945 119/67 -- -- 65 17 98 % -- --  08/07/19 0930 (!) 120/64 -- -- 66 13 97 % -- --  08/07/19 0915 117/65 -- -- 65 13 100 % -- --  08/07/19 0900 (!) 113/57 (!) 97.5 F (36.4 C) -- 76 21 100 % -- --     Recent laboratory studies:  Recent Labs    08/08/19 0249  WBC 14.3*  HGB 10.7*  HCT 32.4*  PLT 271  NA 139  K 4.6  CL 100  CO2 25  BUN 23  CREATININE  0.92  GLUCOSE 155*  CALCIUM 8.6*     Discharge Medications:   Allergies as of 08/08/2019      Reactions   Atorvastatin Other (See Comments)   Intolerance   Sertraline Other (See Comments)   Congestive heart failure.   Sulfa Antibiotics Itching   Sulfasalazine Itching   Penicillins Rash   Tolerated Cephalosporin Date: 08/07/19. Has patient had a PCN reaction causing immediate rash, facial/tongue/throat swelling, SOB or lightheadedness with hypotension: Yes Has patient had a PCN reaction causing severe rash involving mucus membranes or skin necrosis: No Has patient had a PCN reaction that required hospitalization: No Has patient had a PCN reaction occurring within the last 10 years: No If all of the above answers are "NO", then may proceed with Cephalosporin use.      Medication List    STOP taking these medications   albuterol 108 (90 Base) MCG/ACT inhaler Commonly known as:  VENTOLIN HFA   B-12 PO   BIOTIN PO   CENTRUM WOMEN PO   GLUCOSAMINE HCL PO   MAGNESIUM GLYCINATE PO   OMEGA 3 PO   predniSONE 10 MG (21) Tbpk tablet Commonly known as: STERAPRED UNI-PAK 21 TAB   TURMERIC PO   VITAMIN D3 PO     TAKE these medications   allopurinol 300 MG tablet Commonly known as: ZYLOPRIM Take 300 mg by mouth at bedtime.   aspirin 81 MG chewable tablet Commonly known as: Aspirin Childrens Chew 1 tablet (81 mg total) by mouth 2 (two) times daily. Take for 4 weeks, then resume regular dose.   docusate sodium 100 MG capsule Commonly known as: Colace Take 1 capsule (100 mg total) by mouth 2 (two) times daily.   ferrous sulfate 325 (65 FE) MG tablet Commonly known as: FerrouSul Take 1 tablet (325 mg total) by mouth 3 (three) times daily with meals for 14 days.   fluticasone 50 MCG/ACT nasal spray Commonly known as: FLONASE Place 2 sprays into both nostrils daily as needed for allergies or rhinitis.   gabapentin 100 MG capsule Commonly known as: NEURONTIN Take 200 mg by mouth at bedtime. What changed: Another medication with the same name was removed. Continue taking this medication, and follow the directions you see here.   hydrochlorothiazide 25 MG tablet Commonly known as: HYDRODIURIL Take 25 mg by mouth daily.   HYDROcodone-acetaminophen 7.5-325 MG tablet Commonly known as: Norco Take 1-2 tablets by mouth every 4 (four) hours as needed for moderate pain.   metFORMIN 500 MG 24 hr tablet Commonly known as: GLUCOPHAGE-XR Take 1,000 mg by mouth 2 (two) times daily with a meal.   methocarbamol 500 MG tablet Commonly known as: Robaxin Take 1 tablet (500 mg total) by mouth every 6 (six) hours as needed for muscle spasms.   metoprolol succinate 25 MG 24 hr tablet Commonly known as: TOPROL-XL Take 25 mg by mouth daily.   olmesartan 40 MG tablet Commonly known as: BENICAR Take 40 mg by mouth daily.   polyethylene glycol 17 g packet Commonly  known as: MIRALAX / GLYCOLAX Take 17 g by mouth 2 (two) times daily.   pravastatin 80 MG tablet Commonly known as: PRAVACHOL Take 80 mg by mouth at bedtime.            Discharge Care Instructions  (From admission, onward)         Start     Ordered   08/08/19 0000  Change dressing       Comments: Maintain surgical dressing  until follow up in the clinic. If the edges start to pull up, may reinforce with tape. If the dressing is no longer working, may remove and cover with gauze and tape, but must keep the area dry and clean.  Call with any questions or concerns.   08/08/19 0802          Diagnostic Studies: No results found.  Disposition: Home  Discharge Instructions    Call MD / Call 911   Complete by: As directed    If you experience chest pain or shortness of breath, CALL 911 and be transported to the hospital emergency room.  If you develope a fever above 101 F, pus (white drainage) or increased drainage or redness at the wound, or calf pain, call your surgeon's office.   Change dressing   Complete by: As directed    Maintain surgical dressing until follow up in the clinic. If the edges start to pull up, may reinforce with tape. If the dressing is no longer working, may remove and cover with gauze and tape, but must keep the area dry and clean.  Call with any questions or concerns.   Constipation Prevention   Complete by: As directed    Drink plenty of fluids.  Prune juice may be helpful.  You may use a stool softener, such as Colace (over the counter) 100 mg twice a day.  Use MiraLax (over the counter) for constipation as needed.   Diet - low sodium heart healthy   Complete by: As directed    Discharge instructions   Complete by: As directed    Maintain surgical dressing until follow up in the clinic. If the edges start to pull up, may reinforce with tape. If the dressing is no longer working, may remove and cover with gauze and tape, but must keep the area dry and  clean.  Follow up in 2 weeks at [facility]. Call with any questions or concerns.   Increase activity slowly as tolerated   Complete by: As directed    Weight bearing as tolerated with assist device (walker, cane, etc) as directed, use it as long as suggested by your surgeon or therapist, typically at least 4-6 weeks.   TED hose   Complete by: As directed    Use stockings (TED hose) for 2 weeks on both leg(s).  You may remove them at night for sleeping.       Follow-up Information    Rosilyn Mings.. Go on 08/10/2019.   Why: You are scheduled for a physical therapy appointment on 08-10-19 at 2:00 pm.  Contact information: [place] [address] [phone]        Danae Orleans, PA-C. Go on 08/22/2019.   Specialty: Orthopedic Surgery Why: You are scheduled for a post-operative appointment on 08-22-19 at 10:30 am.  Contact information: [address] [place] [address] [phone]                Signed: Lucille Passy [facility] 08/08/2019, 8:02 AM

## 2019-08-10 DIAGNOSIS — M25561 Pain in right knee: Secondary | ICD-10-CM | POA: Diagnosis not present

## 2019-08-10 DIAGNOSIS — M25661 Stiffness of right knee, not elsewhere classified: Secondary | ICD-10-CM | POA: Diagnosis not present

## 2019-08-12 ENCOUNTER — Inpatient Hospital Stay (HOSPITAL_COMMUNITY): Payer: Medicare HMO

## 2019-08-12 ENCOUNTER — Emergency Department (HOSPITAL_COMMUNITY): Payer: Medicare HMO | Admitting: Anesthesiology

## 2019-08-12 ENCOUNTER — Other Ambulatory Visit: Payer: Self-pay

## 2019-08-12 ENCOUNTER — Encounter (HOSPITAL_COMMUNITY): Payer: Self-pay | Admitting: Emergency Medicine

## 2019-08-12 ENCOUNTER — Emergency Department (HOSPITAL_COMMUNITY): Payer: Medicare HMO

## 2019-08-12 ENCOUNTER — Encounter (HOSPITAL_COMMUNITY): Admission: EM | Disposition: A | Discharge status: Home Health | Payer: Self-pay | Source: Home / Self Care

## 2019-08-12 ENCOUNTER — Inpatient Hospital Stay (HOSPITAL_COMMUNITY)
Admission: EM | Admit: 2019-08-12 | Discharge: 2019-08-18 | DRG: 335 | Disposition: A | Discharge status: Home Health | Payer: Medicare HMO | Attending: Surgery | Admitting: Surgery

## 2019-08-12 DIAGNOSIS — R4182 Altered mental status, unspecified: Secondary | ICD-10-CM | POA: Diagnosis not present

## 2019-08-12 DIAGNOSIS — T797XXA Traumatic subcutaneous emphysema, initial encounter: Secondary | ICD-10-CM | POA: Diagnosis present

## 2019-08-12 DIAGNOSIS — K219 Gastro-esophageal reflux disease without esophagitis: Secondary | ICD-10-CM | POA: Diagnosis present

## 2019-08-12 DIAGNOSIS — E876 Hypokalemia: Secondary | ICD-10-CM

## 2019-08-12 DIAGNOSIS — Z882 Allergy status to sulfonamides status: Secondary | ICD-10-CM

## 2019-08-12 DIAGNOSIS — K660 Peritoneal adhesions (postprocedural) (postinfection): Secondary | ICD-10-CM | POA: Diagnosis present

## 2019-08-12 DIAGNOSIS — K659 Peritonitis, unspecified: Secondary | ICD-10-CM | POA: Diagnosis present

## 2019-08-12 DIAGNOSIS — E119 Type 2 diabetes mellitus without complications: Secondary | ICD-10-CM | POA: Diagnosis present

## 2019-08-12 DIAGNOSIS — R6510 Systemic inflammatory response syndrome (SIRS) of non-infectious origin without acute organ dysfunction: Secondary | ICD-10-CM

## 2019-08-12 DIAGNOSIS — K5732 Diverticulitis of large intestine without perforation or abscess without bleeding: Secondary | ICD-10-CM | POA: Diagnosis not present

## 2019-08-12 DIAGNOSIS — Z20822 Contact with and (suspected) exposure to covid-19: Secondary | ICD-10-CM | POA: Diagnosis not present

## 2019-08-12 DIAGNOSIS — N200 Calculus of kidney: Secondary | ICD-10-CM | POA: Diagnosis not present

## 2019-08-12 DIAGNOSIS — J9811 Atelectasis: Secondary | ICD-10-CM | POA: Diagnosis not present

## 2019-08-12 DIAGNOSIS — R109 Unspecified abdominal pain: Secondary | ICD-10-CM | POA: Diagnosis not present

## 2019-08-12 DIAGNOSIS — E785 Hyperlipidemia, unspecified: Secondary | ICD-10-CM | POA: Diagnosis not present

## 2019-08-12 DIAGNOSIS — R0689 Other abnormalities of breathing: Secondary | ICD-10-CM | POA: Diagnosis not present

## 2019-08-12 DIAGNOSIS — Z6831 Body mass index (BMI) 31.0-31.9, adult: Secondary | ICD-10-CM | POA: Diagnosis not present

## 2019-08-12 DIAGNOSIS — K5730 Diverticulosis of large intestine without perforation or abscess without bleeding: Secondary | ICD-10-CM | POA: Diagnosis not present

## 2019-08-12 DIAGNOSIS — K760 Fatty (change of) liver, not elsewhere classified: Secondary | ICD-10-CM | POA: Diagnosis not present

## 2019-08-12 DIAGNOSIS — I5032 Chronic diastolic (congestive) heart failure: Secondary | ICD-10-CM | POA: Diagnosis present

## 2019-08-12 DIAGNOSIS — E669 Obesity, unspecified: Secondary | ICD-10-CM | POA: Diagnosis present

## 2019-08-12 DIAGNOSIS — N179 Acute kidney failure, unspecified: Secondary | ICD-10-CM | POA: Diagnosis present

## 2019-08-12 DIAGNOSIS — R7989 Other specified abnormal findings of blood chemistry: Secondary | ICD-10-CM

## 2019-08-12 DIAGNOSIS — K5780 Diverticulitis of intestine, part unspecified, with perforation and abscess without bleeding: Secondary | ICD-10-CM | POA: Diagnosis not present

## 2019-08-12 DIAGNOSIS — Z0389 Encounter for observation for other suspected diseases and conditions ruled out: Secondary | ICD-10-CM | POA: Diagnosis not present

## 2019-08-12 DIAGNOSIS — Z4659 Encounter for fitting and adjustment of other gastrointestinal appliance and device: Secondary | ICD-10-CM

## 2019-08-12 DIAGNOSIS — Z4682 Encounter for fitting and adjustment of non-vascular catheter: Secondary | ICD-10-CM | POA: Diagnosis not present

## 2019-08-12 DIAGNOSIS — Z96651 Presence of right artificial knee joint: Secondary | ICD-10-CM | POA: Diagnosis not present

## 2019-08-12 DIAGNOSIS — Z7982 Long term (current) use of aspirin: Secondary | ICD-10-CM

## 2019-08-12 DIAGNOSIS — R197 Diarrhea, unspecified: Secondary | ICD-10-CM | POA: Diagnosis not present

## 2019-08-12 DIAGNOSIS — Z7984 Long term (current) use of oral hypoglycemic drugs: Secondary | ICD-10-CM | POA: Diagnosis not present

## 2019-08-12 DIAGNOSIS — I509 Heart failure, unspecified: Secondary | ICD-10-CM | POA: Diagnosis not present

## 2019-08-12 DIAGNOSIS — R52 Pain, unspecified: Secondary | ICD-10-CM | POA: Diagnosis not present

## 2019-08-12 DIAGNOSIS — D72829 Elevated white blood cell count, unspecified: Secondary | ICD-10-CM

## 2019-08-12 DIAGNOSIS — N390 Urinary tract infection, site not specified: Secondary | ICD-10-CM

## 2019-08-12 DIAGNOSIS — M25561 Pain in right knee: Secondary | ICD-10-CM

## 2019-08-12 DIAGNOSIS — Z888 Allergy status to other drugs, medicaments and biological substances status: Secondary | ICD-10-CM

## 2019-08-12 DIAGNOSIS — R778 Other specified abnormalities of plasma proteins: Secondary | ICD-10-CM

## 2019-08-12 DIAGNOSIS — K668 Other specified disorders of peritoneum: Secondary | ICD-10-CM | POA: Diagnosis not present

## 2019-08-12 DIAGNOSIS — R410 Disorientation, unspecified: Secondary | ICD-10-CM | POA: Diagnosis not present

## 2019-08-12 DIAGNOSIS — I110 Hypertensive heart disease with heart failure: Secondary | ICD-10-CM | POA: Diagnosis present

## 2019-08-12 DIAGNOSIS — R404 Transient alteration of awareness: Secondary | ICD-10-CM | POA: Diagnosis not present

## 2019-08-12 DIAGNOSIS — R0902 Hypoxemia: Secondary | ICD-10-CM

## 2019-08-12 DIAGNOSIS — Z79899 Other long term (current) drug therapy: Secondary | ICD-10-CM

## 2019-08-12 DIAGNOSIS — Z471 Aftercare following joint replacement surgery: Secondary | ICD-10-CM | POA: Diagnosis not present

## 2019-08-12 DIAGNOSIS — Z880 Allergy status to penicillin: Secondary | ICD-10-CM

## 2019-08-12 DIAGNOSIS — I10 Essential (primary) hypertension: Secondary | ICD-10-CM | POA: Diagnosis not present

## 2019-08-12 DIAGNOSIS — Z87891 Personal history of nicotine dependence: Secondary | ICD-10-CM | POA: Diagnosis not present

## 2019-08-12 HISTORY — PX: LAPAROTOMY: SHX154

## 2019-08-12 LAB — CBC WITH DIFFERENTIAL/PLATELET
Abs Immature Granulocytes: 0.27 10*3/uL — ABNORMAL HIGH (ref 0.00–0.07)
Basophils Absolute: 0 10*3/uL (ref 0.0–0.1)
Basophils Relative: 0 %
Eosinophils Absolute: 0.1 10*3/uL (ref 0.0–0.5)
Eosinophils Relative: 2 %
HCT: 34.9 % — ABNORMAL LOW (ref 36.0–46.0)
Hemoglobin: 11.2 g/dL — ABNORMAL LOW (ref 12.0–15.0)
Immature Granulocytes: 6 %
Lymphocytes Relative: 9 %
Lymphs Abs: 0.4 10*3/uL — ABNORMAL LOW (ref 0.7–4.0)
MCH: 29.9 pg (ref 26.0–34.0)
MCHC: 32.1 g/dL (ref 30.0–36.0)
MCV: 93.1 fL (ref 80.0–100.0)
Monocytes Absolute: 0.4 10*3/uL (ref 0.1–1.0)
Monocytes Relative: 8 %
Neutro Abs: 3.7 10*3/uL (ref 1.7–7.7)
Neutrophils Relative %: 75 %
Platelets: 362 10*3/uL (ref 150–400)
RBC: 3.75 MIL/uL — ABNORMAL LOW (ref 3.87–5.11)
RDW: 13.5 % (ref 11.5–15.5)
WBC: 4.9 10*3/uL (ref 4.0–10.5)
nRBC: 0.4 % — ABNORMAL HIGH (ref 0.0–0.2)

## 2019-08-12 LAB — URINALYSIS, ROUTINE W REFLEX MICROSCOPIC
Bacteria, UA: NONE SEEN
Bilirubin Urine: NEGATIVE
Glucose, UA: NEGATIVE mg/dL
Hgb urine dipstick: NEGATIVE
Ketones, ur: 80 mg/dL — AB
Nitrite: NEGATIVE
Protein, ur: 100 mg/dL — AB
Specific Gravity, Urine: 1.042 — ABNORMAL HIGH (ref 1.005–1.030)
pH: 5 (ref 5.0–8.0)

## 2019-08-12 LAB — BASIC METABOLIC PANEL
Anion gap: 17 — ABNORMAL HIGH (ref 5–15)
BUN: 14 mg/dL (ref 8–23)
CO2: 24 mmol/L (ref 22–32)
Calcium: 8.4 mg/dL — ABNORMAL LOW (ref 8.9–10.3)
Chloride: 93 mmol/L — ABNORMAL LOW (ref 98–111)
Creatinine, Ser: 1.24 mg/dL — ABNORMAL HIGH (ref 0.44–1.00)
GFR calc Af Amer: 50 mL/min — ABNORMAL LOW (ref >60)
GFR calc non Af Amer: 43 mL/min — ABNORMAL LOW (ref >60)
Glucose, Bld: 200 mg/dL — ABNORMAL HIGH (ref 70–99)
Potassium: 3.2 mmol/L — ABNORMAL LOW (ref 3.5–5.1)
Sodium: 134 mmol/L — ABNORMAL LOW (ref 135–145)

## 2019-08-12 LAB — COMPREHENSIVE METABOLIC PANEL
ALT: 10 U/L (ref 0–44)
AST: 12 U/L — ABNORMAL LOW (ref 15–41)
Albumin: 2.4 g/dL — ABNORMAL LOW (ref 3.5–5.0)
Alkaline Phosphatase: 84 U/L (ref 38–126)
Anion gap: 21 — ABNORMAL HIGH (ref 5–15)
BUN: 12 mg/dL (ref 8–23)
CO2: 20 mmol/L — ABNORMAL LOW (ref 22–32)
Calcium: 8.6 mg/dL — ABNORMAL LOW (ref 8.9–10.3)
Chloride: 92 mmol/L — ABNORMAL LOW (ref 98–111)
Creatinine, Ser: 1.2 mg/dL — ABNORMAL HIGH (ref 0.44–1.00)
GFR calc Af Amer: 52 mL/min — ABNORMAL LOW (ref >60)
GFR calc non Af Amer: 44 mL/min — ABNORMAL LOW (ref >60)
Glucose, Bld: 181 mg/dL — ABNORMAL HIGH (ref 70–99)
Potassium: 2.9 mmol/L — ABNORMAL LOW (ref 3.5–5.1)
Sodium: 133 mmol/L — ABNORMAL LOW (ref 135–145)
Total Bilirubin: 1.8 mg/dL — ABNORMAL HIGH (ref 0.3–1.2)
Total Protein: 6.2 g/dL — ABNORMAL LOW (ref 6.5–8.1)

## 2019-08-12 LAB — PROTIME-INR
INR: 1.1 (ref 0.8–1.2)
Prothrombin Time: 14.1 seconds (ref 11.4–15.2)

## 2019-08-12 LAB — TROPONIN I (HIGH SENSITIVITY)
Troponin I (High Sensitivity): 52 ng/L — ABNORMAL HIGH (ref <18)
Troponin I (High Sensitivity): 39 ng/L — ABNORMAL HIGH (ref <18)

## 2019-08-12 LAB — GLUCOSE, CAPILLARY
Glucose-Capillary: 233 mg/dL — ABNORMAL HIGH (ref 70–99)
Glucose-Capillary: 195 mg/dL — ABNORMAL HIGH (ref 70–99)
Glucose-Capillary: 195 mg/dL — ABNORMAL HIGH (ref 70–99)

## 2019-08-12 LAB — SARS CORONAVIRUS 2 BY RT PCR (HOSPITAL ORDER, PERFORMED IN ~~LOC~~ HOSPITAL LAB): SARS Coronavirus 2: NEGATIVE

## 2019-08-12 LAB — TYPE AND SCREEN
ABO/RH(D): B POS
Antibody Screen: NEGATIVE

## 2019-08-12 LAB — LACTIC ACID, PLASMA
Lactic Acid, Venous: 1.5 mmol/L (ref 0.5–1.9)
Lactic Acid, Venous: 1.7 mmol/L (ref 0.5–1.9)

## 2019-08-12 LAB — POTASSIUM: Potassium: 4.1 mmol/L (ref 3.5–5.1)

## 2019-08-12 LAB — APTT: aPTT: 24 seconds (ref 24–36)

## 2019-08-12 SURGERY — LAPAROTOMY, EXPLORATORY
Anesthesia: General | Site: Abdomen

## 2019-08-12 MED ORDER — METRONIDAZOLE IN NACL 5-0.79 MG/ML-% IV SOLN
500.0000 mg | Freq: Once | INTRAVENOUS | Status: AC
  Administered 2019-08-12: 500 mg via INTRAVENOUS
  Filled 2019-08-12: qty 100

## 2019-08-12 MED ORDER — METRONIDAZOLE IN NACL 5-0.79 MG/ML-% IV SOLN
500.0000 mg | Freq: Three times a day (TID) | INTRAVENOUS | Status: AC
  Administered 2019-08-12 – 2019-08-16 (×12): 500 mg via INTRAVENOUS
  Filled 2019-08-12 (×12): qty 100

## 2019-08-12 MED ORDER — PROPOFOL 10 MG/ML IV BOLUS
INTRAVENOUS | Status: DC | PRN
  Administered 2019-08-12 (×2): 20 mg via INTRAVENOUS
  Administered 2019-08-12: 50 mg via INTRAVENOUS
  Administered 2019-08-12: 10 mg via INTRAVENOUS
  Administered 2019-08-12: 100 mg via INTRAVENOUS
  Administered 2019-08-12: 30 mg via INTRAVENOUS

## 2019-08-12 MED ORDER — PHENYLEPHRINE 40 MCG/ML (10ML) SYRINGE FOR IV PUSH (FOR BLOOD PRESSURE SUPPORT)
PREFILLED_SYRINGE | INTRAVENOUS | Status: AC
  Filled 2019-08-12: qty 10

## 2019-08-12 MED ORDER — CHLORHEXIDINE GLUCONATE 0.12 % MT SOLN: 15.0000 mL | Freq: Once | OROMUCOSAL | Status: AC

## 2019-08-12 MED ORDER — INSULIN ASPART 100 UNIT/ML ~~LOC~~ SOLN
0.0000 [IU] | Freq: Four times a day (QID) | SUBCUTANEOUS | Status: DC
  Administered 2019-08-12 – 2019-08-13 (×3): 2 [IU] via SUBCUTANEOUS
  Administered 2019-08-13 – 2019-08-14 (×5): 1 [IU] via SUBCUTANEOUS
  Administered 2019-08-14: 2 [IU] via SUBCUTANEOUS
  Administered 2019-08-15: 1 [IU] via SUBCUTANEOUS
  Administered 2019-08-15 (×2): 2 [IU] via SUBCUTANEOUS
  Administered 2019-08-16: 1 [IU] via SUBCUTANEOUS
  Administered 2019-08-16 – 2019-08-17 (×5): 2 [IU] via SUBCUTANEOUS
  Administered 2019-08-17 (×2): 1 [IU] via SUBCUTANEOUS

## 2019-08-12 MED ORDER — ORAL CARE MOUTH RINSE: 15.0000 mL | Freq: Once | OROMUCOSAL | Status: AC

## 2019-08-12 MED ORDER — FENTANYL CITRATE (PF) 100 MCG/2ML IJ SOLN
25.0000 ug | INTRAMUSCULAR | Status: DC | PRN
  Administered 2019-08-12 (×2): 50 ug via INTRAVENOUS

## 2019-08-12 MED ORDER — FENTANYL CITRATE (PF) 250 MCG/5ML IJ SOLN
INTRAMUSCULAR | Status: AC
  Filled 2019-08-12: qty 5

## 2019-08-12 MED ORDER — VANCOMYCIN HCL IN DEXTROSE 1-5 GM/200ML-% IV SOLN
1000.0000 mg | INTRAVENOUS | Status: DC
  Filled 2019-08-12: qty 200

## 2019-08-12 MED ORDER — PROMETHAZINE HCL 25 MG/ML IJ SOLN
6.2500 mg | Freq: Four times a day (QID) | INTRAMUSCULAR | Status: DC | PRN
  Administered 2019-08-12: 6.25 mg via INTRAVENOUS
  Filled 2019-08-12: qty 1

## 2019-08-12 MED ORDER — ACETAMINOPHEN 500 MG PO TABS
1000.0000 mg | ORAL_TABLET | Freq: Once | ORAL | Status: AC
  Administered 2019-08-12: 1000 mg via ORAL
  Filled 2019-08-12: qty 2

## 2019-08-12 MED ORDER — POTASSIUM CHLORIDE 10 MEQ/100ML IV SOLN
10.0000 meq | INTRAVENOUS | Status: AC
  Administered 2019-08-12 (×2): 10 meq via INTRAVENOUS
  Filled 2019-08-12 (×2): qty 100

## 2019-08-12 MED ORDER — DOCUSATE SODIUM 50 MG/5ML PO LIQD
100.0000 mg | Freq: Two times a day (BID) | ORAL | Status: DC
  Administered 2019-08-13 – 2019-08-15 (×7): 100 mg
  Filled 2019-08-12 (×7): qty 10

## 2019-08-12 MED ORDER — METOPROLOL TARTRATE 5 MG/5ML IV SOLN: 5.0000 mg | Freq: Four times a day (QID) | INTRAVENOUS | Status: DC | PRN

## 2019-08-12 MED ORDER — PHENYLEPHRINE 40 MCG/ML (10ML) SYRINGE FOR IV PUSH (FOR BLOOD PRESSURE SUPPORT)
PREFILLED_SYRINGE | INTRAVENOUS | Status: DC | PRN
  Administered 2019-08-12: 40 ug via INTRAVENOUS
  Administered 2019-08-12: 80 ug via INTRAVENOUS
  Administered 2019-08-12: 120 ug via INTRAVENOUS
  Administered 2019-08-12 (×2): 80 ug via INTRAVENOUS
  Administered 2019-08-12: 40 ug via INTRAVENOUS

## 2019-08-12 MED ORDER — SODIUM CHLORIDE 0.9 % IV SOLN
2.0000 g | INTRAVENOUS | Status: AC
  Administered 2019-08-12 – 2019-08-15 (×4): 2 g via INTRAVENOUS
  Filled 2019-08-12 (×4): qty 20

## 2019-08-12 MED ORDER — POTASSIUM CHLORIDE 10 MEQ/100ML IV SOLN
10.0000 meq | INTRAVENOUS | Status: AC
  Administered 2019-08-12 (×3): 10 meq via INTRAVENOUS
  Filled 2019-08-12 (×4): qty 100

## 2019-08-12 MED ORDER — DEXAMETHASONE SODIUM PHOSPHATE 10 MG/ML IJ SOLN
INTRAMUSCULAR | Status: AC
  Filled 2019-08-12: qty 1

## 2019-08-12 MED ORDER — PHENYLEPHRINE HCL-NACL 10-0.9 MG/250ML-% IV SOLN
INTRAVENOUS | Status: DC | PRN
Start: 2019-08-12 — End: 2019-08-12
  Administered 2019-08-12: 25 ug/min via INTRAVENOUS

## 2019-08-12 MED ORDER — MORPHINE SULFATE (PF) 2 MG/ML IV SOLN
1.0000 mg | INTRAVENOUS | Status: DC | PRN
  Administered 2019-08-12 (×2): 2 mg via INTRAVENOUS
  Filled 2019-08-12 (×2): qty 1

## 2019-08-12 MED ORDER — FENTANYL CITRATE (PF) 100 MCG/2ML IJ SOLN
25.0000 ug | Freq: Once | INTRAMUSCULAR | Status: AC
  Administered 2019-08-12: 25 ug via INTRAVENOUS
  Filled 2019-08-12: qty 2

## 2019-08-12 MED ORDER — ROCURONIUM BROMIDE 10 MG/ML (PF) SYRINGE
PREFILLED_SYRINGE | INTRAVENOUS | Status: DC | PRN
  Administered 2019-08-12: 40 mg via INTRAVENOUS
  Administered 2019-08-12 (×3): 10 mg via INTRAVENOUS

## 2019-08-12 MED ORDER — FENTANYL CITRATE (PF) 100 MCG/2ML IJ SOLN
INTRAMUSCULAR | Status: AC
  Administered 2019-08-12: 50 ug via INTRAVENOUS
  Filled 2019-08-12: qty 2

## 2019-08-12 MED ORDER — PROPOFOL 10 MG/ML IV BOLUS
INTRAVENOUS | Status: AC
  Filled 2019-08-12: qty 20

## 2019-08-12 MED ORDER — ACETAMINOPHEN 500 MG PO TABS
1000.0000 mg | ORAL_TABLET | Freq: Four times a day (QID) | ORAL | Status: DC
  Administered 2019-08-12: 1000 mg via ORAL
  Filled 2019-08-12: qty 2

## 2019-08-12 MED ORDER — SODIUM CHLORIDE 0.9 % IV BOLUS
500.0000 mL | Freq: Once | INTRAVENOUS | Status: AC
  Administered 2019-08-12: 500 mL via INTRAVENOUS

## 2019-08-12 MED ORDER — SODIUM CHLORIDE 0.9 % IV SOLN: 2.0000 g | Freq: Once | INTRAVENOUS | Status: DC

## 2019-08-12 MED ORDER — ONDANSETRON HCL 4 MG/2ML IJ SOLN: 4.0000 mg | Freq: Once | INTRAMUSCULAR | Status: DC | PRN

## 2019-08-12 MED ORDER — ACETAMINOPHEN 500 MG PO TABS
1000.0000 mg | ORAL_TABLET | Freq: Four times a day (QID) | ORAL | Status: DC
  Administered 2019-08-13 (×2): 1000 mg
  Filled 2019-08-12 (×2): qty 2

## 2019-08-12 MED ORDER — ONDANSETRON 4 MG PO TBDP
4.0000 mg | ORAL_TABLET | Freq: Four times a day (QID) | ORAL | Status: DC | PRN
  Administered 2019-08-17: 4 mg via ORAL
  Filled 2019-08-12: qty 1

## 2019-08-12 MED ORDER — SUCCINYLCHOLINE CHLORIDE 200 MG/10ML IV SOSY
PREFILLED_SYRINGE | INTRAVENOUS | Status: AC
  Filled 2019-08-12: qty 10

## 2019-08-12 MED ORDER — ONDANSETRON HCL 4 MG/2ML IJ SOLN
INTRAMUSCULAR | Status: AC
  Filled 2019-08-12: qty 2

## 2019-08-12 MED ORDER — LACTATED RINGERS IV SOLN: INTRAVENOUS | Status: AC

## 2019-08-12 MED ORDER — LACTATED RINGERS IV SOLN
INTRAVENOUS | Status: DC | PRN
Start: 2019-08-12 — End: 2019-08-12

## 2019-08-12 MED ORDER — ROCURONIUM BROMIDE 10 MG/ML (PF) SYRINGE
PREFILLED_SYRINGE | INTRAVENOUS | Status: AC
  Filled 2019-08-12: qty 10

## 2019-08-12 MED ORDER — SUGAMMADEX SODIUM 200 MG/2ML IV SOLN
INTRAVENOUS | Status: DC | PRN
  Administered 2019-08-12: 200 mg via INTRAVENOUS

## 2019-08-12 MED ORDER — SODIUM CHLORIDE 0.9 % IV SOLN: 2.0000 g | Freq: Two times a day (BID) | INTRAVENOUS | Status: DC

## 2019-08-12 MED ORDER — LIDOCAINE 2% (20 MG/ML) 5 ML SYRINGE
INTRAMUSCULAR | Status: AC
  Filled 2019-08-12: qty 5

## 2019-08-12 MED ORDER — FENTANYL CITRATE (PF) 250 MCG/5ML IJ SOLN
INTRAMUSCULAR | Status: DC | PRN
  Administered 2019-08-12: 50 ug via INTRAVENOUS
  Administered 2019-08-12: 25 ug via INTRAVENOUS
  Administered 2019-08-12: 50 ug via INTRAVENOUS
  Administered 2019-08-12: 25 ug via INTRAVENOUS
  Administered 2019-08-12 (×4): 50 ug via INTRAVENOUS

## 2019-08-12 MED ORDER — DEXAMETHASONE SODIUM PHOSPHATE 10 MG/ML IJ SOLN
INTRAMUSCULAR | Status: DC | PRN
  Administered 2019-08-12: 4 mg via INTRAVENOUS

## 2019-08-12 MED ORDER — SODIUM CHLORIDE 0.9 % IV SOLN
2.0000 g | Freq: Once | INTRAVENOUS | Status: AC
  Administered 2019-08-12: 2 g via INTRAVENOUS
  Filled 2019-08-12: qty 2

## 2019-08-12 MED ORDER — 0.9 % SODIUM CHLORIDE (POUR BTL) OPTIME
TOPICAL | Status: DC | PRN
  Administered 2019-08-12: 5000 mL

## 2019-08-12 MED ORDER — METHOCARBAMOL 500 MG PO TABS
1000.0000 mg | ORAL_TABLET | Freq: Three times a day (TID) | ORAL | Status: DC
  Administered 2019-08-12: 1000 mg via ORAL
  Filled 2019-08-12: qty 2

## 2019-08-12 MED ORDER — LIDOCAINE 2% (20 MG/ML) 5 ML SYRINGE
INTRAMUSCULAR | Status: DC | PRN
  Administered 2019-08-12: 60 mg via INTRAVENOUS

## 2019-08-12 MED ORDER — ONDANSETRON HCL 4 MG/2ML IJ SOLN
INTRAMUSCULAR | Status: DC | PRN
  Administered 2019-08-12: 4 mg via INTRAVENOUS

## 2019-08-12 MED ORDER — CHLORHEXIDINE GLUCONATE 0.12 % MT SOLN
OROMUCOSAL | Status: AC
  Administered 2019-08-12: 15 mL via OROMUCOSAL
  Filled 2019-08-12: qty 15

## 2019-08-12 MED ORDER — FENTANYL CITRATE (PF) 100 MCG/2ML IJ SOLN: 50.0000 ug | Freq: Once | INTRAMUSCULAR | Status: AC

## 2019-08-12 MED ORDER — IOHEXOL 350 MG/ML SOLN
75.0000 mL | Freq: Once | INTRAVENOUS | Status: AC | PRN
  Administered 2019-08-12: 75 mL via INTRAVENOUS

## 2019-08-12 MED ORDER — ONDANSETRON HCL 4 MG/2ML IJ SOLN
4.0000 mg | Freq: Four times a day (QID) | INTRAMUSCULAR | Status: DC | PRN
  Administered 2019-08-14 – 2019-08-17 (×4): 4 mg via INTRAVENOUS
  Filled 2019-08-12 (×4): qty 2

## 2019-08-12 MED ORDER — ENOXAPARIN SODIUM 40 MG/0.4ML ~~LOC~~ SOLN
40.0000 mg | SUBCUTANEOUS | Status: DC
  Administered 2019-08-13 – 2019-08-18 (×6): 40 mg via SUBCUTANEOUS
  Filled 2019-08-12 (×6): qty 0.4

## 2019-08-12 MED ORDER — VANCOMYCIN HCL IN DEXTROSE 1-5 GM/200ML-% IV SOLN
1000.0000 mg | Freq: Once | INTRAVENOUS | Status: AC
  Administered 2019-08-12: 1000 mg via INTRAVENOUS
  Filled 2019-08-12: qty 200

## 2019-08-12 MED ORDER — LACTATED RINGERS IV SOLN: INTRAVENOUS | Status: DC

## 2019-08-12 MED ORDER — ALBUMIN HUMAN 5 % IV SOLN: INTRAVENOUS | Status: DC | PRN

## 2019-08-12 MED ORDER — OXYCODONE HCL 5 MG PO TABS
5.0000 mg | ORAL_TABLET | ORAL | Status: DC | PRN
  Administered 2019-08-12 – 2019-08-17 (×6): 10 mg
  Administered 2019-08-18: 5 mg
  Filled 2019-08-12 (×4): qty 2
  Filled 2019-08-12: qty 1
  Filled 2019-08-12 (×2): qty 2

## 2019-08-12 MED ORDER — DOCUSATE SODIUM 100 MG PO CAPS
100.0000 mg | ORAL_CAPSULE | Freq: Two times a day (BID) | ORAL | Status: DC
  Filled 2019-08-12: qty 1

## 2019-08-12 MED ORDER — SUCCINYLCHOLINE CHLORIDE 200 MG/10ML IV SOSY
PREFILLED_SYRINGE | INTRAVENOUS | Status: DC | PRN
  Administered 2019-08-12: 80 mg via INTRAVENOUS

## 2019-08-12 SURGICAL SUPPLY — 46 items
APL PRP STRL LF DISP 70% ISPRP (MISCELLANEOUS) ×1
CHLORAPREP W/TINT 26 (MISCELLANEOUS) ×2 IMPLANT
COVER SURGICAL LIGHT HANDLE (MISCELLANEOUS) ×3 IMPLANT
DRAIN CHANNEL 19F RND (DRAIN) ×2 IMPLANT
DRAPE LAPAROTOMY (DRAPES) ×1 IMPLANT
DRAPE UNIVERSAL (DRAPES) ×2 IMPLANT
DRAPE WARM FLUID 44X44 (DRAPES) ×2 IMPLANT
DRSG OPSITE POSTOP 4X12 (GAUZE/BANDAGES/DRESSINGS) ×1 IMPLANT
ELECT BLADE 4.0 EZ CLEAN MEGAD (MISCELLANEOUS) ×2
ELECT BLADE 6.5 EXT (BLADE) ×1 IMPLANT
ELECT REM PT RETURN 9FT ADLT (ELECTROSURGICAL) ×2
ELECTRODE BLDE 4.0 EZ CLN MEGD (MISCELLANEOUS) IMPLANT
ELECTRODE REM PT RTRN 9FT ADLT (ELECTROSURGICAL) ×1 IMPLANT
EVACUATOR SILICONE 100CC (DRAIN) ×2 IMPLANT
GLOVE BIO SURGEON STRL SZ 6.5 (GLOVE) ×6 IMPLANT
GLOVE BIO SURGEON STRL SZ8 (GLOVE) ×2 IMPLANT
GLOVE BIOGEL PI IND STRL 6 (GLOVE) ×1 IMPLANT
GLOVE BIOGEL PI IND STRL 8 (GLOVE) IMPLANT
GLOVE BIOGEL PI INDICATOR 6 (GLOVE) ×3
GLOVE BIOGEL PI INDICATOR 8 (GLOVE) ×2
GOWN STRL REUS W/ TWL LRG LVL3 (GOWN DISPOSABLE) ×2 IMPLANT
GOWN STRL REUS W/TWL LRG LVL3 (GOWN DISPOSABLE) ×4
GOWN STRL REUS W/TWL XL LVL3 (GOWN DISPOSABLE) ×2 IMPLANT
HANDLE SUCTION POOLE (INSTRUMENTS) ×1 IMPLANT
KIT BASIN OR (CUSTOM PROCEDURE TRAY) ×2 IMPLANT
KIT TURNOVER KIT B (KITS) ×2 IMPLANT
LIGASURE IMPACT 36 18CM CVD LR (INSTRUMENTS) IMPLANT
NS IRRIG 1000ML POUR BTL (IV SOLUTION) ×7 IMPLANT
PACK GENERAL/GYN (CUSTOM PROCEDURE TRAY) ×2 IMPLANT
PAD ARMBOARD 7.5X6 YLW CONV (MISCELLANEOUS) ×2 IMPLANT
PENCIL SMOKE EVACUATOR (MISCELLANEOUS) ×2 IMPLANT
SPONGE LAP 18X18 RF (DISPOSABLE) ×1 IMPLANT
STAPLER VISISTAT 35W (STAPLE) ×3 IMPLANT
SUCTION POOLE HANDLE (INSTRUMENTS) ×2
SUT ETHILON 2 0 FS 18 (SUTURE) ×2 IMPLANT
SUT PDS AB 1 TP1 54 (SUTURE) IMPLANT
SUT PDS AB 1 TP1 96 (SUTURE) ×2 IMPLANT
SUT SILK 0 SH 30 (SUTURE) ×3 IMPLANT
SUT SILK 2 0 SH CR/8 (SUTURE) ×1 IMPLANT
SUT SILK 2 0 TIES 10X30 (SUTURE) ×2 IMPLANT
SUT SILK 2 0SH CR/8 30 (SUTURE) ×1 IMPLANT
SUT SILK 3 0 SH CR/8 (SUTURE) ×2 IMPLANT
SUT SILK 3 0 TIES 10X30 (SUTURE) ×2 IMPLANT
SUT VIC AB 3-0 SH 18 (SUTURE) IMPLANT
TOWEL GREEN STERILE (TOWEL DISPOSABLE) ×2 IMPLANT
YANKAUER SUCT BULB TIP NO VENT (SUCTIONS) ×1 IMPLANT

## 2019-08-12 NOTE — Consult Note (Signed)
Reason for Consult:pneumoperitoneum on chest ct Referring Physician: Dr Marlane Hatcher is an 74 y.o. female.  HPI: 33 yof with dm, htn presents postop five days from right TKA.  Since surgery she has had left sided and lower abdominal pain that has not gotten better.  She has had some nausea she attributes to the pain medication. No emesis. She has not had a bm since surgery. She is passing flatus. Has had prior benign bladder tumor excision and btl. Has history of csc that she gets every five years due to polyps- doesn't report anything else found.  She was brought to er with report of fever and confusion. After some fluids and abx she is much better. She initially underwent cxr that was negative and due to confusion and symptoms had a ct angio chest- this shows some small dots of free intraperitoneal air along the anterior abdomen on the lowest cuts. She is currently undergoing ct abdomen and I was asked to see her.   Past Medical History:  Diagnosis Date  . Anginal pain ([place])   . Arthritis    Hands, Knees hips, feet,   . CHF (congestive heart failure) ([place]) 1990s   pt feels it is from Zoloft or a virus  . Essential hypertension 01/08/2014  . GERD (gastroesophageal reflux disease)   . Goiter   . Gout   . Hyperlipidemia 01/08/2014  . Obesity (BMI 30-39.9) 01/08/2014  . Type 2 diabetes mellitus without complication ([place]) 42/70/6237    Past Surgical History:  Procedure Laterality Date  . CARDIAC SURGERY    . DIAGNOSTIC LAPAROSCOPY    . EYE SURGERY     bilateral  RK 10 yrs ago  . left and right knee arthroscopies- left 6 months ago    . LEFT HEART CATHETERIZATION WITH CORONARY ANGIOGRAM N/A 01/10/2014   Procedure: LEFT HEART CATHETERIZATION WITH CORONARY ANGIOGRAM;  Surgeon: Jacolyn Reedy, MD;  Location: [facility] CATH LAB;  Service: Cardiovascular;  Laterality: N/A;  . removal of bladder tumor    . TOTAL KNEE ARTHROPLASTY  11/16/2010   Procedure: TOTAL KNEE ARTHROPLASTY;  Surgeon:  Mauri Pole;  Location: [facility] ORS;  Service: Orthopedics;  Laterality: Left;  . TOTAL KNEE ARTHROPLASTY Right 08/07/2019   Procedure: TOTAL KNEE ARTHROPLASTY;  Surgeon: Paralee Cancel, MD;  Location: [facility] ORS;  Service: Orthopedics;  Laterality: Right;  70 mins  . TUBAL LIGATION      Family History  Family history unknown: Yes    Social History:  reports that she quit smoking about 41 years ago. She has a 8.00 pack-year smoking history. She has never used smokeless tobacco. She reports current alcohol use of about 1.0 standard drink of alcohol per week. She reports that she does not use drugs.  Allergies:  Allergies  Allergen Reactions  . Atorvastatin Other (See Comments)    Intolerance   . Sertraline Other (See Comments)    Congestive heart failure.  . Sulfa Antibiotics Itching  . Sulfasalazine Itching  . Penicillins Rash    Tolerated Cephalosporin Date: 08/07/19.  Has patient had a PCN reaction causing immediate rash, facial/tongue/throat swelling, SOB or lightheadedness with hypotension: Yes Has patient had a PCN reaction causing severe rash involving mucus membranes or skin necrosis: No Has patient had a PCN reaction that required hospitalization: No Has patient had a PCN reaction occurring within the last 10 years: No If all of the above answers are "NO", then may proceed with Cephalosporin use.  Medications: I have reviewed the patient's current medications.  Results for orders placed or performed during the hospital encounter of 08/12/19 (from the past 48 hour(s))  Lactic acid, plasma     Status: None   Collection Time: 08/12/19  2:02 AM  Result Value Ref Range   Lactic Acid, Venous 1.5 0.5 - 1.9 mmol/L    Comment: Performed at [facility] [address] [place], [place] [address]  Comprehensive metabolic panel     Status: Abnormal   Collection Time: 08/12/19  2:02 AM  Result Value Ref Range   Sodium 133 (L) 135 - 145 mmol/L   Potassium 2.9 (L) 3.5 - 5.1  mmol/L   Chloride 92 (L) 98 - 111 mmol/L   CO2 20 (L) 22 - 32 mmol/L   Glucose, Bld 181 (H) 70 - 99 mg/dL    Comment: Glucose reference range applies only to samples taken after fasting for at least 8 hours.   BUN 12 8 - 23 mg/dL   Creatinine, Ser 1.20 (H) 0.44 - 1.00 mg/dL   Calcium 8.6 (L) 8.9 - 10.3 mg/dL   Total Protein 6.2 (L) 6.5 - 8.1 g/dL   Albumin 2.4 (L) 3.5 - 5.0 g/dL   AST 12 (L) 15 - 41 U/L   ALT 10 0 - 44 U/L   Alkaline Phosphatase 84 38 - 126 U/L   Total Bilirubin 1.8 (H) 0.3 - 1.2 mg/dL   GFR calc non Af Amer 44 (L) >60 mL/min   GFR calc Af Amer 52 (L) >60 mL/min   Anion gap 21 (H) 5 - 15    Comment: Performed at [facility], [place] [address], [place], [place] [address]  CBC WITH DIFFERENTIAL     Status: Abnormal   Collection Time: 08/12/19  2:02 AM  Result Value Ref Range   WBC 4.9 4.0 - 10.5 K/uL   RBC 3.75 (L) 3.87 - 5.11 MIL/uL   Hemoglobin 11.2 (L) 12.0 - 15.0 g/dL   HCT 34.9 (L) 36 - 46 %   MCV 93.1 80.0 - 100.0 fL   MCH 29.9 26.0 - 34.0 pg   MCHC 32.1 30.0 - 36.0 g/dL   RDW 13.5 11.5 - 15.5 %   Platelets 362 150 - 400 K/uL   nRBC 0.4 (H) 0.0 - 0.2 %   Neutrophils Relative % 75 %   Neutro Abs 3.7 1.7 - 7.7 K/uL   Lymphocytes Relative 9 %   Lymphs Abs 0.4 (L) 0.7 - 4.0 K/uL   Monocytes Relative 8 %   Monocytes Absolute 0.4 0 - 1 K/uL   Eosinophils Relative 2 %   Eosinophils Absolute 0.1 0 - 0 K/uL   Basophils Relative 0 %   Basophils Absolute 0.0 0 - 0 K/uL   WBC Morphology VACUOLATED NEUTROPHILS    Immature Granulocytes 6 %   Abs Immature Granulocytes 0.27 (H) 0.00 - 0.07 K/uL    Comment: Performed at [facility], [address], [place], [place] [address]  Protime-INR     Status: None   Collection Time: 08/12/19  2:02 AM  Result Value Ref Range   Prothrombin Time 14.1 11.4 - 15.2 seconds   INR 1.1 0.8 - 1.2    Comment: (NOTE) INR goal varies based on device and disease states. Performed at [facility], [place] [address]  [place], [place] [address]   APTT     Status: None   Collection Time: 08/12/19  2:02  AM  Result Value Ref Range   aPTT 24 24 - 36 seconds    Comment: Performed at [facility], [place] [address] [place], [place] [address]  Troponin I (High Sensitivity)     Status: Abnormal   Collection Time: 08/12/19  2:02 AM  Result Value Ref Range   Troponin I (High Sensitivity) 52 (H) <18 ng/L    Comment: (NOTE) Elevated high sensitivity troponin I (hsTnI) values and significant  changes across serial measurements may suggest ACS but many other  chronic and acute conditions are known to elevate hsTnI results.  Refer to the "Links" section for chest pain algorithms and additional  guidance. Performed at [facility], [place] [address], [place], [place] [address]   Lactic acid, plasma     Status: None   Collection Time: 08/12/19  3:25 AM  Result Value Ref Range   Lactic Acid, Venous 1.7 0.5 - 1.9 mmol/L    Comment: Performed at [facility] [address] [place], [place] [address]  SARS Coronavirus 2 by RT PCR (hospital order, performed in [facility] hospital lab) Nasopharyngeal Nasopharyngeal Swab     Status: None   Collection Time: 08/12/19  4:53 AM   Specimen: Nasopharyngeal Swab  Result Value Ref Range   SARS Coronavirus 2 NEGATIVE NEGATIVE    Comment: (NOTE) SARS-CoV-2 target nucleic acids are NOT DETECTED.  The SARS-CoV-2 RNA is generally detectable in upper and lower respiratory specimens during the acute phase of infection. The lowest concentration of SARS-CoV-2 viral copies this assay can detect is 250 copies / mL. A negative result does not preclude SARS-CoV-2 infection and should not be used as the sole basis for treatment or other patient management decisions.  A negative result may occur with improper specimen collection / handling, submission of specimen other than nasopharyngeal swab, presence of viral mutation(s) within the areas targeted by this assay, and  inadequate number of viral copies (<250 copies / mL). A negative result must be combined with clinical observations, patient history, and epidemiological information.  Fact Sheet for Patients:   StrictlyIdeas.no  Fact Sheet for Healthcare Providers: BankingDealers.co.za  This test is not yet approved or  cleared by the [place] FDA and has been authorized for detection and/or diagnosis of SARS-CoV-2 by FDA under an Emergency Use Authorization (EUA).  This EUA will remain in effect (meaning this test can be used) for the duration of the COVID-19 declaration under Section 564(b)(1) of the Act, 21 U.S.C. section 360bbb-3(b)(1), unless the authorization is terminated or revoked sooner.  Performed at [facility], [place] [address], [place], [place] [address]   Troponin I (High Sensitivity)     Status: Abnormal   Collection Time: 08/12/19  5:17 AM  Result Value Ref Range   Troponin I (High Sensitivity) 39 (H) <18 ng/L    Comment: (NOTE) Elevated high sensitivity troponin I (hsTnI) values and significant  changes across serial measurements may suggest ACS but many other  chronic and acute conditions are known to elevate hsTnI results.  Refer to the "Links" section for chest pain algorithms and additional  guidance. Performed at [facility], [place] [address], [place], [place] [address]   Urinalysis, Routine w reflex microscopic     Status: Abnormal   Collection Time: 08/12/19  5:54 AM  Result Value Ref Range   Color, Urine AMBER (A) YELLOW    Comment: BIOCHEMICALS MAY BE AFFECTED BY COLOR   APPearance CLOUDY (A)  CLEAR   Specific Gravity, Urine 1.042 (H) 1.005 - 1.030   pH 5.0 5.0 - 8.0   Glucose, UA NEGATIVE NEGATIVE mg/dL   Hgb urine dipstick NEGATIVE NEGATIVE   Bilirubin Urine NEGATIVE NEGATIVE   Ketones, ur 80 (A) NEGATIVE mg/dL   Protein, ur 100 (A) NEGATIVE mg/dL   Nitrite NEGATIVE NEGATIVE   Leukocytes,Ua  MODERATE (A) NEGATIVE   RBC / HPF 6-10 0 - 5 RBC/hpf   WBC, UA 21-50 0 - 5 WBC/hpf   Bacteria, UA NONE SEEN NONE SEEN   Squamous Epithelial / LPF 6-10 0 - 5   Mucus PRESENT    Amorphous Crystal PRESENT    Non Squamous Epithelial 0-5 (A) NONE SEEN    Comment: Performed at [facility] [address] [place], [place] [address]    CT Angio Chest PE W and/or Wo Contrast  Result Date: 08/12/2019 CLINICAL DATA:  Confusion.  Abdominal pain.  Recent knee surgery. EXAM: CT ANGIOGRAPHY CHEST WITH CONTRAST TECHNIQUE: Multidetector CT imaging of the chest was performed using the standard protocol during bolus administration of intravenous contrast. Multiplanar CT image reconstructions and MIPs were obtained to evaluate the vascular anatomy. CONTRAST:  59mL OMNIPAQUE IOHEXOL 350 MG/ML SOLN COMPARISON:  None. FINDINGS: Cardiovascular: There is satisfactory opacification of the pulmonary arteries to the segmental level. There is no evidence of a pulmonary embolism. Heart is mildly enlarged. No pericardial effusion mild 2 vessel coronary artery calcifications. Aorta is normal in caliber. No dissection. Mild aortic atherosclerosis. Mediastinum/Nodes: No neck base, axillary, mediastinal or hilar masses or enlarged lymph nodes. Trachea and esophagus are unremarkable. Lungs/Pleura: Study acquired in expiration. Linear atelectasis noted in the lung bases. No evidence pneumonia or pulmonary edema. No pleural effusion or pneumothorax. Upper Abdomen: Small bubbles of free intraperitoneal air are seen along the anterior abdomen. The etiology of these is unclear. Small sliding hiatal hernia. Musculoskeletal: No fracture or acute finding. No osteoblastic or osteolytic lesions. Review of the MIP images confirms the above findings. IMPRESSION: 1. No evidence of a pulmonary embolism. 2. Small bubbles of free intraperitoneal air noted in the limited visualization of the upper abdomen. The etiology of this unclear. Recommend  follow-up abdomen and pelvis CT scan for further assessment. 3. Mild cardiomegaly.  Mild aortic atherosclerosis. 4. Mild lung atelectasis.  No acute findings in the lungs. Aortic Atherosclerosis (ICD10-I70.0). Electronically Signed   By: Lajean Manes M.D.   On: 08/12/2019 05:01   DG Chest Port 1 View  Result Date: 08/12/2019 CLINICAL DATA:  Possible sepsis EXAM: PORTABLE CHEST 1 VIEW COMPARISON:  12/08/2018 FINDINGS: Cardiac shadow is stable. Mild aortic calcifications are seen. The lungs are well aerated bilaterally. No focal infiltrate or sizable effusion is seen. No bony abnormality is noted. Degenerative changes of the thoracic spine are noted. IMPRESSION: No acute abnormality seen. Electronically Signed   By: Inez Catalina M.D.   On: 08/12/2019 02:32   DG Knee Complete 4 Views Right  Result Date: 08/12/2019 CLINICAL DATA:  Status post right knee replacement EXAM: RIGHT KNEE - COMPLETE 4+ VIEW COMPARISON:  None. FINDINGS: Right knee prosthesis is noted in satisfactory position. No acute fracture or dislocation is noted. No soft tissue changes are seen. IMPRESSION: No acute abnormality noted. Electronically Signed   By: Inez Catalina M.D.   On: 08/12/2019 02:32    Review of Systems  Constitutional: Positive for fever.  Gastrointestinal: Positive for abdominal pain, constipation and nausea.  All other systems reviewed and are negative.  Blood pressure (!) 116/60, pulse (!) 122, temperature (!) 100.7 F (38.2 C), temperature source Oral, resp. rate 22, height 5\' 1"  (1.549 m), weight 74.8 kg, SpO2 95 %. Physical Exam Constitutional:      Appearance: She is obese. She is not toxic-appearing.  HENT:     Head: Normocephalic and atraumatic.     Mouth/Throat:     Mouth: Mucous membranes are moist.     Pharynx: Oropharynx is clear.  Eyes:     Extraocular Movements: Extraocular movements intact.     Conjunctiva/sclera: Conjunctivae normal.  Cardiovascular:     Rate and Rhythm: Regular rhythm.  Tachycardia present.     Pulses: Normal pulses.  Pulmonary:     Effort: Pulmonary effort is normal.     Breath sounds: Normal breath sounds.  Abdominal:     General: There is distension.     Tenderness: There is abdominal tenderness (left midabdomen).     Hernia: No hernia is present.  Skin:    General: Skin is warm and dry.     Capillary Refill: Capillary refill takes less than 2 seconds.  Neurological:     General: No focal deficit present.     Mental Status: She is alert.  Psychiatric:        Mood and Affect: Mood normal.        Behavior: Behavior normal.     Assessment/Plan: Abdominal pain/pneumoperitoneum -I think with exam and initial ct scan she may very well need to go to OR however she is not ill appearing right now -discussed possiblity of elap with repair of perforation, resection even colostomy -will await ct abdomen being done now to finalize plan- I suppose there are some findings (perforated diverticulitis) that we could initially treat nonoperatively   Rolm Bookbinder 08/12/2019, 6:40 AM

## 2019-08-12 NOTE — Anesthesia Procedure Notes (Signed)
Procedure Name: Intubation Date/Time: 08/12/2019 9:03 AM Performed by: Wilburn Cornelia, CRNA Pre-anesthesia Checklist: Patient identified, Emergency Drugs available, Suction available, Patient being monitored and Timeout performed Patient Re-evaluated:Patient Re-evaluated prior to induction Oxygen Delivery Method: Circle system utilized Preoxygenation: Pre-oxygenation with 100% oxygen Induction Type: IV induction and Rapid sequence Laryngoscope Size: Mac and 3 Grade View: Grade III Tube type: Oral Tube size: 7.0 mm Number of attempts: 1 Airway Equipment and Method: Stylet Placement Confirmation: ETT inserted through vocal cords under direct vision,  positive ETCO2,  CO2 detector and breath sounds checked- equal and bilateral Secured at: 21 cm Tube secured with: Tape Dental Injury: Teeth and Oropharynx as per pre-operative assessment

## 2019-08-12 NOTE — OR Nursing (Signed)
PATIENT ARRIVED WITH SILVER COLORED METAL WATCH IN PLASTIC SPECIMEN CONTAINER WITH NAME LABEL.

## 2019-08-12 NOTE — Consult Note (Signed)
Hospitalist Service Medical Consultation   Shannon Moore  LOV:[id number]  DOB: 1945/12/12  DOA: 08/12/2019  PCP: Harlan Stains, MD   Requesting physician: Dr. Donne Hazel  Reason for consultation: Management of medical problem  History of Present Illness: Shannon Moore is an 74 y.o. female with PMH significant for HTN, DM 2, diastolic heart failure, GERD who is 5 days post right TKA who presented to the ED for lower abdominal pain and left-sided abdominal pain along with some nausea but no vomiting.  In the ED patient was noted to have low-grade fever 100.7, to be tachycardic to 120 and to be slightly confused.  Patient was treated with IV fluids and antibiotics with improvement in mental status.  CTA of chest showed some free air in the anterior abdomen and CT abdomen of pelvis showed "free intraperitoneal air consistent with a ruptured viscus.... There is an apparent collection in the anterior left lower quadrant 9 cm in dimension... Loop of small bowel in the mid to upper abdomen shows wall thickening and mild distention consistent with infectious or inflammatory enteritis."  Patient was seen by Dr. Donne Hazel and was scheduled for emergent surgery.  Tried hospitalist are consulted to assist with management of medical conditions.   Review of Systems:  Patient unable to provide HPI due to sleepiness.  Past Medical History: Past Medical History:  Diagnosis Date  . Anginal pain ([place])   . Arthritis    Hands, Knees hips, feet,   . CHF (congestive heart failure) ([place]) 1990s   pt feels it is from Zoloft or a virus  . Essential hypertension 01/08/2014  . GERD (gastroesophageal reflux disease)   . Goiter   . Gout   . Hyperlipidemia 01/08/2014  . Obesity (BMI 30-39.9) 01/08/2014  . Type 2 diabetes mellitus without complication ([place]) 88/41/6606    Past Surgical History: Past Surgical History:  Procedure Laterality Date  . CARDIAC SURGERY    . DIAGNOSTIC LAPAROSCOPY     . EYE SURGERY     bilateral  RK 10 yrs ago  . left and right knee arthroscopies- left 6 months ago    . LEFT HEART CATHETERIZATION WITH CORONARY ANGIOGRAM N/A 01/10/2014   Procedure: LEFT HEART CATHETERIZATION WITH CORONARY ANGIOGRAM;  Surgeon: Jacolyn Reedy, MD;  Location: [facility] CATH LAB;  Service: Cardiovascular;  Laterality: N/A;  . removal of bladder tumor    . TOTAL KNEE ARTHROPLASTY  11/16/2010   Procedure: TOTAL KNEE ARTHROPLASTY;  Surgeon: Mauri Pole;  Location: [facility] ORS;  Service: Orthopedics;  Laterality: Left;  . TOTAL KNEE ARTHROPLASTY Right 08/07/2019   Procedure: TOTAL KNEE ARTHROPLASTY;  Surgeon: Paralee Cancel, MD;  Location: [facility] ORS;  Service: Orthopedics;  Laterality: Right;  70 mins  . TUBAL LIGATION       Allergies:   Allergies  Allergen Reactions  . Atorvastatin Other (See Comments)    Intolerance   . Sertraline Other (See Comments)    Congestive heart failure.  . Sulfa Antibiotics Itching  . Sulfasalazine Itching  . Penicillins Rash    Tolerated Cephalosporin Date: 08/07/19.  Has patient had a PCN reaction causing immediate rash, facial/tongue/throat swelling, SOB or lightheadedness with hypotension: Yes Has patient had a PCN reaction causing severe rash involving mucus membranes or skin necrosis: No Has patient had a PCN reaction that required hospitalization: No Has patient had a PCN reaction occurring within the last 10 years: No If all  of the above answers are "NO", then may proceed with Cephalosporin use.      Social History:  reports that she quit smoking about 41 years ago. She has a 8.00 pack-year smoking history. She has never used smokeless tobacco. She reports current alcohol use of about 1.0 standard drink of alcohol per week. She reports that she does not use drugs.   Family History: Family History  Family history unknown: Yes     Physical Exam: Vitals:   08/12/19 1230 08/12/19 1245 08/12/19 1300 08/12/19 1316  BP: (!) 104/58 (!)  106/64 106/65 (!) 114/63  Pulse: 105 102 105 101  Resp: 14 12 12 16   Temp:   98 F (36.7 C) 98.1 F (36.7 C)  TempSrc:    Oral  SpO2: 96% 95% 96% 95%  Weight:      Height:        Constitutional: Sleepy female postop CVS: Distant heart sounds, regular, no murmurs  respiratory: Decreased air entry likely secondary decrease inspiratory effort. GI: Postop, CDI. LE: No edema. Neuro: Patient is sleepy, grossly nonfocal but unable to do complete assessment   Data reviewed:  I have personally reviewed following labs and imaging studies Labs:  CBC: Recent Labs  Lab 08/08/19 0249 08/12/19 0202  WBC 14.3* 4.9  NEUTROABS  --  3.7  HGB 10.7* 11.2*  HCT 32.4* 34.9*  MCV 93.9 93.1  PLT 271 413    Basic Metabolic Panel: Recent Labs  Lab 08/08/19 0249 08/12/19 0202  NA 139 133*  K 4.6 2.9*  CL 100 92*  CO2 25 20*  GLUCOSE 155* 181*  BUN 23 12  CREATININE 0.92 1.20*  CALCIUM 8.6* 8.6*   GFR Estimated Creatinine Clearance: 38 mL/min (A) (by C-G formula based on SCr of 1.2 mg/dL (H)). Liver Function Tests: Recent Labs  Lab 08/12/19 0202  AST 12*  ALT 10  ALKPHOS 84  BILITOT 1.8*  PROT 6.2*  ALBUMIN 2.4*   No results for input(s): LIPASE, AMYLASE in the last 168 hours. No results for input(s): AMMONIA in the last 168 hours. Coagulation profile Recent Labs  Lab 08/12/19 0202  INR 1.1    Cardiac Enzymes: No results for input(s): CKTOTAL, CKMB, CKMBINDEX, TROPONINI in the last 168 hours. BNP: Invalid input(s): POCBNP CBG: Recent Labs  Lab 08/07/19 2328 08/08/19 0731 08/08/19 1132 08/12/19 0854 08/12/19 1130  GLUCAP 152* 150* 188* 233* 195*   D-Dimer No results for input(s): DDIMER in the last 72 hours. Hgb A1c No results for input(s): HGBA1C in the last 72 hours. Lipid Profile No results for input(s): CHOL, HDL, LDLCALC, TRIG, CHOLHDL, LDLDIRECT in the last 72 hours. Thyroid function studies No results for input(s): TSH, T4TOTAL, T3FREE, THYROIDAB in  the last 72 hours.  Invalid input(s): FREET3 Anemia work up No results for input(s): VITAMINB12, FOLATE, FERRITIN, TIBC, IRON, RETICCTPCT in the last 72 hours. Urinalysis    Component Value Date/Time   COLORURINE AMBER (A) 08/12/2019 0554   APPEARANCEUR CLOUDY (A) 08/12/2019 0554   LABSPEC 1.042 (H) 08/12/2019 0554   PHURINE 5.0 08/12/2019 0554   GLUCOSEU NEGATIVE 08/12/2019 0554   HGBUR NEGATIVE 08/12/2019 0554   BILIRUBINUR NEGATIVE 08/12/2019 0554   KETONESUR 80 (A) 08/12/2019 0554   PROTEINUR 100 (A) 08/12/2019 0554   UROBILINOGEN 0.2 11/11/2010 1530   NITRITE NEGATIVE 08/12/2019 0554   LEUKOCYTESUR MODERATE (A) 08/12/2019 0554     Sepsis Labs Invalid input(s): PROCALCITONIN,  WBC,  LACTICIDVEN Microbiology Recent Results (from the past 240 hour(s))  SARS CORONAVIRUS 2 (TAT 6-24 HRS) Nasopharyngeal Nasopharyngeal Swab     Status: None   Collection Time: 08/03/19  9:03 AM   Specimen: Nasopharyngeal Swab  Result Value Ref Range Status   SARS Coronavirus 2 NEGATIVE NEGATIVE Final    Comment: (NOTE) SARS-CoV-2 target nucleic acids are NOT DETECTED.  The SARS-CoV-2 RNA is generally detectable in upper and lower respiratory specimens during the acute phase of infection. Negative results do not preclude SARS-CoV-2 infection, do not rule out co-infections with other pathogens, and should not be used as the sole basis for treatment or other patient management decisions. Negative results must be combined with clinical observations, patient history, and epidemiological information. The expected result is Negative.  Fact Sheet for Patients: SugarRoll.be  Fact Sheet for Healthcare Providers: https://www.woods-mathews.com/  This test is not yet approved or cleared by the [place] FDA and  has been authorized for detection and/or diagnosis of SARS-CoV-2 by FDA under an Emergency Use Authorization (EUA). This EUA will remain  in  effect (meaning this test can be used) for the duration of the COVID-19 declaration under Se ction 564(b)(1) of the Act, 21 U.S.C. section 360bbb-3(b)(1), unless the authorization is terminated or revoked sooner.  Performed at [facility], [place] [address], [place], [place] [address]   SARS Coronavirus 2 by RT PCR (hospital order, performed in [facility] hospital lab) Nasopharyngeal Nasopharyngeal Swab     Status: None   Collection Time: 08/12/19  4:53 AM   Specimen: Nasopharyngeal Swab  Result Value Ref Range Status   SARS Coronavirus 2 NEGATIVE NEGATIVE Final    Comment: (NOTE) SARS-CoV-2 target nucleic acids are NOT DETECTED.  The SARS-CoV-2 RNA is generally detectable in upper and lower respiratory specimens during the acute phase of infection. The lowest concentration of SARS-CoV-2 viral copies this assay can detect is 250 copies / mL. A negative result does not preclude SARS-CoV-2 infection and should not be used as the sole basis for treatment or other patient management decisions.  A negative result may occur with improper specimen collection / handling, submission of specimen other than nasopharyngeal swab, presence of viral mutation(s) within the areas targeted by this assay, and inadequate number of viral copies (<250 copies / mL). A negative result must be combined with clinical observations, patient history, and epidemiological information.  Fact Sheet for Patients:   StrictlyIdeas.no  Fact Sheet for Healthcare Providers: BankingDealers.co.za  This test is not yet approved or  cleared by the [place] FDA and has been authorized for detection and/or diagnosis of SARS-CoV-2 by FDA under an Emergency Use Authorization (EUA).  This EUA will remain in effect (meaning this test can be used) for the duration of the COVID-19 declaration under Section 564(b)(1) of the Act, 21 U.S.C. section 360bbb-3(b)(1), unless the  authorization is terminated or revoked sooner.  Performed at [facility], [place] [address], [place], [place] [address]        Inpatient Medications:   Scheduled Meds: Continuous Infusions: . ceFEPime (MAXIPIME) IV    . lactated ringers Stopped (08/12/19 0725)  . potassium chloride    . vancomycin       Radiological Exams on Admission: CT Angio Chest PE W and/or Wo Contrast  Result Date: 08/12/2019 CLINICAL DATA:  Confusion.  Abdominal pain.  Recent knee surgery. EXAM: CT ANGIOGRAPHY CHEST WITH CONTRAST TECHNIQUE: Multidetector CT imaging of the chest was performed using the standard protocol during bolus administration of intravenous contrast. Multiplanar CT image reconstructions and  MIPs were obtained to evaluate the vascular anatomy. CONTRAST:  17mL OMNIPAQUE IOHEXOL 350 MG/ML SOLN COMPARISON:  None. FINDINGS: Cardiovascular: There is satisfactory opacification of the pulmonary arteries to the segmental level. There is no evidence of a pulmonary embolism. Heart is mildly enlarged. No pericardial effusion mild 2 vessel coronary artery calcifications. Aorta is normal in caliber. No dissection. Mild aortic atherosclerosis. Mediastinum/Nodes: No neck base, axillary, mediastinal or hilar masses or enlarged lymph nodes. Trachea and esophagus are unremarkable. Lungs/Pleura: Study acquired in expiration. Linear atelectasis noted in the lung bases. No evidence pneumonia or pulmonary edema. No pleural effusion or pneumothorax. Upper Abdomen: Small bubbles of free intraperitoneal air are seen along the anterior abdomen. The etiology of these is unclear. Small sliding hiatal hernia. Musculoskeletal: No fracture or acute finding. No osteoblastic or osteolytic lesions. Review of the MIP images confirms the above findings. IMPRESSION: 1. No evidence of a pulmonary embolism. 2. Small bubbles of free intraperitoneal air noted in the limited visualization of the upper abdomen. The etiology of this  unclear. Recommend follow-up abdomen and pelvis CT scan for further assessment. 3. Mild cardiomegaly.  Mild aortic atherosclerosis. 4. Mild lung atelectasis.  No acute findings in the lungs. Aortic Atherosclerosis (ICD10-I70.0). Electronically Signed   By: Lajean Manes M.D.   On: 08/12/2019 05:01   DG Chest Port 1 View  Result Date: 08/12/2019 CLINICAL DATA:  Possible sepsis EXAM: PORTABLE CHEST 1 VIEW COMPARISON:  12/08/2018 FINDINGS: Cardiac shadow is stable. Mild aortic calcifications are seen. The lungs are well aerated bilaterally. No focal infiltrate or sizable effusion is seen. No bony abnormality is noted. Degenerative changes of the thoracic spine are noted. IMPRESSION: No acute abnormality seen. Electronically Signed   By: Inez Catalina M.D.   On: 08/12/2019 02:32   DG Knee Complete 4 Views Right  Result Date: 08/12/2019 CLINICAL DATA:  Status post right knee replacement EXAM: RIGHT KNEE - COMPLETE 4+ VIEW COMPARISON:  None. FINDINGS: Right knee prosthesis is noted in satisfactory position. No acute fracture or dislocation is noted. No soft tissue changes are seen. IMPRESSION: No acute abnormality noted. Electronically Signed   By: Inez Catalina M.D.   On: 08/12/2019 02:32   DG Abd Portable 1V  Result Date: 08/12/2019 CLINICAL DATA:  NG tube placement EXAM: PORTABLE ABDOMEN - 1 VIEW COMPARISON:  None. FINDINGS: 1237 hours. NG tube tip is in the distal stomach. Nonspecific bowel gas pattern. Surgical drains overlie the left abdomen. IMPRESSION: NG tube tip is in the distal stomach near the pylorus. Electronically Signed   By: Misty Stanley M.D.   On: 08/12/2019 13:01   CT Renal Stone Study  Result Date: 08/12/2019 CLINICAL DATA:  Follow-up from a CT angiogram of the chest that showed a small amount of free intraperitoneal air. Patient with abdominal pain. Recent knee surgery. EXAM: CT ABDOMEN AND PELVIS WITHOUT CONTRAST TECHNIQUE: Multidetector CT imaging of the abdomen and pelvis was performed  following the standard protocol without IV contrast. COMPARISON:  Current CTA chest. FINDINGS: Lower chest: Mild linear atelectasis.  No acute findings. Hepatobiliary: Liver normal in size. No mass or focal lesion. Normal gallbladder. No bile duct dilation. Pancreas: Unremarkable. No pancreatic ductal dilatation or surrounding inflammatory changes. Spleen: Normal in size without focal abnormality. Adrenals/Urinary Tract: No adrenal masses. Mild bilateral renal cortical thinning. Symmetric bilateral renal enhancement and excretion. Small, 7 mm, low-density mass, lower pole the left kidney, consistent with a cyst. 5 mm low-density lesion, upper pole the right kidney, also likely a  cyst. No other masses, no stones and no hydronephrosis. Normal ureters. Bladder is unremarkable. Stomach/Bowel: There is free intraperitoneal air. In the left lower quadrant, there is apparent collection with nondependent air, which measures approximately 9 x 3.8 x 6.9 cm. This is potentially a focal area of dilated small bowel, but cannot be confidently connected to the small bowel. It is felt more likely to be an extraluminal collection/abscess. Small bubbles of free intraperitoneal air lies along the anterior superior margin of this with more significant free air collecting along the anterior aspect of the mid to upper abdomen. There is a smaller apparent collection that lies just superior to the bladder. The left colon is mostly decompressed. There are multiple diverticula most evident along the sigmoid. No convincing diverticulitis. No colonic wall thickening or other inflammatory process. Normal stomach. Prominent loops of proximal small bowel noted in the left mid to upper abdomen, 3.1 cm in diameter mild wall thickening. No other small bowel dilation or wall thickening. Normal appendix visualized. Vascular/Lymphatic: Aortic atherosclerotic calcifications. No aneurysm. No enlarged lymph nodes. Reproductive: Uterus and bilateral adnexa  are unremarkable. Other: No ascites. Musculoskeletal: No fracture or acute finding. No osteoblastic or osteolytic lesions. IMPRESSION: 1. Free intraperitoneal air consistent with a ruptured viscus. The exact site/cause of the perforation is not definitive. There is an apparent collection in the anterior left lower quadrant, 9 cm in greatest dimension. Small additional collection suggested just superior to the bladder. A loop of small bowel in the mid to upper abdomen shows wall thickening and mild distension consistent with infectious or inflammatory enteritis. There are multiple sigmoid colon diverticula, but no evidence of diverticulitis. 2. No other acute abnormality within the abdomen or pelvis. 3. Aortic atherosclerosis. Electronically Signed   By: Lajean Manes M.D.   On: 08/12/2019 06:44    Impression/Recommendations Active Problems:   Air in deep soft tissue of abdomen (HCC)   Ruptured viscus/enteritis Status post surgical repair Further management including antibiotic coverage per general surgery  Hypokalemia Will place patient on telemetry Appears that patient has may be received 2 out of 5 runs of K prior to surgery Will recheck potassium stat now Order for further runs of K and recheck potassium tonight Will need to replete aggressively and follow closely  AKI Should improve with treatment of infection/peritonitis and hydration Avoid renal toxic medications Recheck in the morning after hydration  Elevated QTC Very likely secondary to profound hypokalemia We will place patient on telemetry Recheck EKG in the morning after potassium has been adequately repleted  DM2 Hold Metformin CBG every 6 hours as patient is n.p.o.  HTN Hold HCTZ, olmesartan and Toprol-XL Will write for as needed metoprolol IV as needed for BP control  Gout Hold allopurinol as patient is still n.p.o.  HL Pravastatin can be restarted once patient is able to take p.o.    Thank you for this  consultation.  Our [facility] hospitalist team will follow the patient with you.     Vashti Hey M.D. [facility] 08/12/2019, 3:09 PM Please page [facility] [email or web address] for questions

## 2019-08-12 NOTE — Anesthesia Preprocedure Evaluation (Addendum)
Anesthesia Evaluation  Patient identified by MRN, date of birth, ID band Patient awake    Reviewed: Allergy & Precautions, NPO status , Patient's Chart, lab work & pertinent test results  Airway Mallampati: III  TM Distance: >3 FB Neck ROM: Full    Dental no notable dental hx.    Pulmonary former smoker,    Pulmonary exam normal breath sounds clear to auscultation       Cardiovascular hypertension, Pt. on home beta blockers and Pt. on medications +CHF  Normal cardiovascular exam Rhythm:Regular Rate:Normal  ECG: ST, rate 123   Neuro/Psych negative neurological ROS  negative psych ROS   GI/Hepatic Neg liver ROS, GERD  ,  Endo/Other  diabetes, Oral Hypoglycemic Agents  Renal/GU negative Renal ROS     Musculoskeletal  (+) Arthritis , Gout   Abdominal (+) + obese,   Peds  Hematology  (+) anemia , HLD   Anesthesia Other Findings FREE AIR ABDOMEN  Reproductive/Obstetrics                            Anesthesia Physical Anesthesia Plan  ASA: III and emergent  Anesthesia Plan: General   Post-op Pain Management:    Induction: Intravenous and Rapid sequence  PONV Risk Score and Plan: 4 or greater and Ondansetron, Dexamethasone, Treatment may vary due to age or medical condition and Propofol infusion  Airway Management Planned: Oral ETT  Additional Equipment:   Intra-op Plan:   Post-operative Plan: Extubation in OR  Informed Consent: I have reviewed the patients History and Physical, chart, labs and discussed the procedure including the risks, benefits and alternatives for the proposed anesthesia with the patient or authorized representative who has indicated his/her understanding and acceptance.     Dental advisory given  Plan Discussed with: CRNA  Anesthesia Plan Comments:       Anesthesia Quick Evaluation

## 2019-08-12 NOTE — ED Notes (Addendum)
Pt. Transported to CT 

## 2019-08-12 NOTE — OR Nursing (Signed)
PATIENT'S SILVER COLORED METAL WATCH IN PLASTIC CONTAINER WITH HER NAME LABEL ACCOMPANIED PATIENT TO PACU; PACU NURSE GIVEN CONTAINER.

## 2019-08-12 NOTE — Progress Notes (Signed)
Pharmacy Antibiotic Note  Shannon Moore is a 74 y.o. female admitted on 08/12/2019 with r/o sepsis.  Pharmacy has been consulted for Vancomycin/Cefepime dosing. WBC WNL. Mild bump in Scr. Pt had ortho surgery on 7/27. Also having some abdominal pain.   Plan: Vancomycin 1000 mg IV q24h Cefepime 2g IV q12h Trend WBC, temp, renal function  F/U infectious work-up Drug levels as indicated   Height: 5\' 1"  (154.9 cm) Weight: 74.8 kg (165 lb) IBW/kg (Calculated) : 47.8  Temp (24hrs), Avg:100.7 F (38.2 C), Min:100.7 F (38.2 C), Max:100.7 F (38.2 C)  Recent Labs  Lab 08/08/19 0249 08/12/19 0202 08/12/19 0325  WBC 14.3* 4.9  --   CREATININE 0.92 1.20*  --   LATICACIDVEN  --  1.5 1.7    Estimated Creatinine Clearance: 38 mL/min (A) (by C-G formula based on SCr of 1.2 mg/dL (H)).    Allergies  Allergen Reactions  . Atorvastatin Other (See Comments)    Intolerance   . Sertraline Other (See Comments)    Congestive heart failure.  . Sulfa Antibiotics Itching  . Sulfasalazine Itching  . Penicillins Rash    Tolerated Cephalosporin Date: 08/07/19.  Has patient had a PCN reaction causing immediate rash, facial/tongue/throat swelling, SOB or lightheadedness with hypotension: Yes Has patient had a PCN reaction causing severe rash involving mucus membranes or skin necrosis: No Has patient had a PCN reaction that required hospitalization: No Has patient had a PCN reaction occurring within the last 10 years: No If all of the above answers are "NO", then may proceed with Cephalosporin use.     Narda Bonds, PharmD, BCPS Clinical Pharmacist Phone: [phone]

## 2019-08-12 NOTE — ED Provider Notes (Addendum)
[facility] EMERGENCY DEPARTMENT Provider Note   CSN: [id number] Arrival date & time: 08/12/19  0123     History Chief Complaint  Patient presents with  . Knee Pain  . Altered Mental Status    Shannon Moore is a 74 y.o. female.  The history is provided by the EMS personnel. The history is limited by the condition of the patient.  Knee Pain Location:  Knee Lower extremity injury: surgery    Knee location:  R knee Pain details:    Quality:  Aching   Radiates to:  Does not radiate   Severity:  Moderate   Onset quality:  Gradual   Timing:  Constant   Progression:  Worsening Chronicity:  New Dislocation: no   Tetanus status:  Unknown Relieved by:  Nothing Worsened by:  Nothing Ineffective treatments:  None tried Associated symptoms: fever   Associated symptoms: no back pain   Risk factors: no concern for non-accidental trauma   Altered Mental Status Presenting symptoms: confusion   Severity:  Moderate Most recent episode:  Today Episode history:  Single Timing:  Constant Progression:  Unchanged Chronicity:  New Context: recent illness   Associated symptoms: fever   Patient with knee surgery Tuesday presents with fevers and worsening knee pain and confusion.       Past Medical History:  Diagnosis Date  . Anginal pain ([place])   . Arthritis    Hands, Knees hips, feet,   . CHF (congestive heart failure) ([place]) 1990s   pt feels it is from Zoloft or a virus  . Essential hypertension 01/08/2014  . GERD (gastroesophageal reflux disease)   . Goiter   . Gout   . Hyperlipidemia 01/08/2014  . Obesity (BMI 30-39.9) 01/08/2014  . Type 2 diabetes mellitus without complication ([place]) 38/46/6599    Patient Active Problem List   Diagnosis Date Noted  . Right knee OA 08/07/2019  . 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 Abnormal EKG 01/08/2014  . Chest pain 01/08/2014  . Dyspnea 01/08/2014  . Palpitations 01/08/2014  . Essential  hypertension 01/08/2014  . Type 2 diabetes mellitus without complication ([place]) 35/70/1779  . Obesity (BMI 30-39.9) 01/08/2014  . Hyperlipidemia 01/08/2014    Past Surgical History:  Procedure Laterality Date  . CARDIAC SURGERY    . DIAGNOSTIC LAPAROSCOPY    . EYE SURGERY     bilateral  RK 10 yrs ago  . left and right knee arthroscopies- left 6 months ago    . LEFT HEART CATHETERIZATION WITH CORONARY ANGIOGRAM N/A 01/10/2014   Procedure: LEFT HEART CATHETERIZATION WITH CORONARY ANGIOGRAM;  Surgeon: Jacolyn Reedy, MD;  Location: [facility] CATH LAB;  Service: Cardiovascular;  Laterality: N/A;  . removal of bladder tumor    . TOTAL KNEE ARTHROPLASTY  11/16/2010   Procedure: TOTAL KNEE ARTHROPLASTY;  Surgeon: Mauri Pole;  Location: [facility] ORS;  Service: Orthopedics;  Laterality: Left;  . TOTAL KNEE ARTHROPLASTY Right 08/07/2019   Procedure: TOTAL KNEE ARTHROPLASTY;  Surgeon: Paralee Cancel, MD;  Location: [facility] ORS;  Service: Orthopedics;  Laterality: Right;  70 mins  . TUBAL LIGATION       OB History   No obstetric history on file.     Family History  Family history unknown: Yes    Social History   Tobacco Use  . Smoking status: Former Smoker    Packs/day: 1.00    Years: 8.00    Pack years: 8.00    Quit date: 11/10/1977  Years since quitting: 41.7  . Smokeless tobacco: Never Used  Vaping Use  . Vaping Use: Never used  Substance Use Topics  . Alcohol use: Yes    Alcohol/week: 1.0 standard drink    Types: 1 Glasses of wine per week  . Drug use: No    Home Medications Prior to Admission medications   Medication Sig Start Date End Date Taking? Authorizing Provider  allopurinol (ZYLOPRIM) 300 MG tablet Take 300 mg by mouth at bedtime.     [provider]  aspirin (ASPIRIN CHILDRENS) 81 MG chewable tablet Chew 1 tablet (81 mg total) by mouth 2 (two) times daily. Take for 4 weeks, then resume regular dose. 08/08/19 09/07/19  Danae Orleans, PA-C  docusate sodium (COLACE)  100 MG capsule Take 1 capsule (100 mg total) by mouth 2 (two) times daily. 08/08/19   Danae Orleans, PA-C  ferrous sulfate (FERROUSUL) 325 (65 FE) MG tablet Take 1 tablet (325 mg total) by mouth 3 (three) times daily with meals for 14 days. 08/08/19 08/22/19  Danae Orleans, PA-C  fluticasone (FLONASE) 50 MCG/ACT nasal spray Place 2 sprays into both nostrils daily as needed for allergies or rhinitis.    [provider]  gabapentin (NEURONTIN) 100 MG capsule Take 200 mg by mouth at bedtime. 07/09/19   [provider]  hydrochlorothiazide (HYDRODIURIL) 25 MG tablet Take 25 mg by mouth daily.  09/14/18   [provider]  HYDROcodone-acetaminophen (NORCO) 7.5-325 MG tablet Take 1-2 tablets by mouth every 4 (four) hours as needed for moderate pain. 08/08/19   Danae Orleans, PA-C  metFORMIN (GLUCOPHAGE-XR) 500 MG 24 hr tablet Take 1,000 mg by mouth 2 (two) times daily with a meal.     [provider]  methocarbamol (ROBAXIN) 500 MG tablet Take 1 tablet (500 mg total) by mouth every 6 (six) hours as needed for muscle spasms. 08/08/19   Danae Orleans, PA-C  metoprolol succinate (TOPROL-XL) 25 MG 24 hr tablet Take 25 mg by mouth daily.  02/14/17   [provider]  olmesartan (BENICAR) 40 MG tablet Take 40 mg by mouth daily.  09/14/18   [provider]  polyethylene glycol (MIRALAX / GLYCOLAX) 17 g packet Take 17 g by mouth 2 (two) times daily. 08/08/19   Danae Orleans, PA-C  pravastatin (PRAVACHOL) 80 MG tablet Take 80 mg by mouth at bedtime.     [provider]    Allergies    Atorvastatin, Sertraline, Sulfa antibiotics, Sulfasalazine, and Penicillins  Review of Systems   Review of Systems  Unable to perform ROS: Mental status change  Constitutional: Positive for fever.  Respiratory: Negative for shortness of breath.   Cardiovascular: Negative for chest pain.  Musculoskeletal: Positive for arthralgias. Negative for back pain.    Psychiatric/Behavioral: Positive for confusion.    Physical Exam Updated Vital Signs BP (!) 116/60 (BP Location: Right Arm)   Pulse (!) 122   Temp (!) 100.7 F (38.2 C) (Oral)   Resp 22   Ht 5\' 1"  (1.549 m)   Wt 74.8 kg   SpO2 95%   BMI 31.18 kg/m   Physical Exam Vitals and nursing note reviewed.  Constitutional:      Appearance: Normal appearance. She is not diaphoretic.  HENT:     Head: Normocephalic and atraumatic.     Nose: Nose normal.  Eyes:     Conjunctiva/sclera: Conjunctivae normal.     Pupils: Pupils are equal, round, and reactive to light.  Cardiovascular:  Rate and Rhythm: Regular rhythm. Tachycardia present.     Pulses: Normal pulses.     Heart sounds: Normal heart sounds.  Pulmonary:     Effort: Pulmonary effort is normal.     Breath sounds: Normal breath sounds. No wheezing.  Abdominal:     General: Bowel sounds are decreased. There is distension.     Tenderness: There is no abdominal tenderness. There is no guarding.  Musculoskeletal:        General: Normal range of motion.     Cervical back: Normal range of motion and neck supple.  Skin:    General: Skin is warm and dry.     Capillary Refill: Capillary refill takes less than 2 seconds.       Neurological:     General: No focal deficit present.     Mental Status: She is alert and oriented to person, place, and time.     Deep Tendon Reflexes: Reflexes normal.  Psychiatric:        Mood and Affect: Mood normal.        Behavior: Behavior normal.     ED Results / Procedures / Treatments   Labs (all labs ordered are listed, but only abnormal results are displayed) Results for orders placed or performed during the hospital encounter of 08/12/19  Lactic acid, plasma  Result Value Ref Range   Lactic Acid, Venous 1.5 0.5 - 1.9 mmol/L  Comprehensive metabolic panel  Result Value Ref Range   Sodium 133 (L) 135 - 145 mmol/L   Potassium 2.9 (L) 3.5 - 5.1 mmol/L   Chloride 92 (L) 98 - 111 mmol/L    CO2 20 (L) 22 - 32 mmol/L   Glucose, Bld 181 (H) 70 - 99 mg/dL   BUN 12 8 - 23 mg/dL   Creatinine, Ser 1.20 (H) 0.44 - 1.00 mg/dL   Calcium 8.6 (L) 8.9 - 10.3 mg/dL   Total Protein 6.2 (L) 6.5 - 8.1 g/dL   Albumin 2.4 (L) 3.5 - 5.0 g/dL   AST 12 (L) 15 - 41 U/L   ALT 10 0 - 44 U/L   Alkaline Phosphatase 84 38 - 126 U/L   Total Bilirubin 1.8 (H) 0.3 - 1.2 mg/dL   GFR calc non Af Amer 44 (L) >60 mL/min   GFR calc Af Amer 52 (L) >60 mL/min   Anion gap 21 (H) 5 - 15  CBC WITH DIFFERENTIAL  Result Value Ref Range   WBC 4.9 4.0 - 10.5 K/uL   RBC 3.75 (L) 3.87 - 5.11 MIL/uL   Hemoglobin 11.2 (L) 12.0 - 15.0 g/dL   HCT 34.9 (L) 36 - 46 %   MCV 93.1 80.0 - 100.0 fL   MCH 29.9 26.0 - 34.0 pg   MCHC 32.1 30.0 - 36.0 g/dL   RDW 13.5 11.5 - 15.5 %   Platelets 362 150 - 400 K/uL   nRBC 0.4 (H) 0.0 - 0.2 %   Neutrophils Relative % 75 %   Neutro Abs 3.7 1.7 - 7.7 K/uL   Lymphocytes Relative 9 %   Lymphs Abs 0.4 (L) 0.7 - 4.0 K/uL   Monocytes Relative 8 %   Monocytes Absolute 0.4 0 - 1 K/uL   Eosinophils Relative 2 %   Eosinophils Absolute 0.1 0 - 0 K/uL   Basophils Relative 0 %   Basophils Absolute 0.0 0 - 0 K/uL   WBC Morphology VACUOLATED NEUTROPHILS    Immature Granulocytes 6 %   Abs Immature Granulocytes  0.27 (H) 0.00 - 0.07 K/uL  Protime-INR  Result Value Ref Range   Prothrombin Time 14.1 11.4 - 15.2 seconds   INR 1.1 0.8 - 1.2  APTT  Result Value Ref Range   aPTT 24 24 - 36 seconds   DG Chest Port 1 View  Result Date: 08/12/2019 CLINICAL DATA:  Possible sepsis EXAM: PORTABLE CHEST 1 VIEW COMPARISON:  12/08/2018 FINDINGS: Cardiac shadow is stable. Mild aortic calcifications are seen. The lungs are well aerated bilaterally. No focal infiltrate or sizable effusion is seen. No bony abnormality is noted. Degenerative changes of the thoracic spine are noted. IMPRESSION: No acute abnormality seen. Electronically Signed   By: Inez Catalina M.D.   On: 08/12/2019 02:32   DG Knee  Complete 4 Views Right  Result Date: 08/12/2019 CLINICAL DATA:  Status post right knee replacement EXAM: RIGHT KNEE - COMPLETE 4+ VIEW COMPARISON:  None. FINDINGS: Right knee prosthesis is noted in satisfactory position. No acute fracture or dislocation is noted. No soft tissue changes are seen. IMPRESSION: No acute abnormality noted. Electronically Signed   By: Inez Catalina M.D.   On: 08/12/2019 02:32    EKG EKG Interpretation  Date/Time:  Sunday August 12 2019 01:40:52 EDT Ventricular Rate:  123 PR Interval:    QRS Duration: 89 QT Interval:  380 QTC Calculation: 544 R Axis:   -19 Text Interpretation: Sinus tachycardia Borderline left axis deviation Low voltage, precordial leads Repol abnrm suggests ischemia, lateral leads Prolonged QT interval Baseline wander in lead(s) II III aVF Confirmed by Randal Buba, Beckhem Isadore ([id number]) on 08/12/2019 3:11:11 AM   Radiology DG Chest Port 1 View  Result Date: 08/12/2019 CLINICAL DATA:  Possible sepsis EXAM: PORTABLE CHEST 1 VIEW COMPARISON:  12/08/2018 FINDINGS: Cardiac shadow is stable. Mild aortic calcifications are seen. The lungs are well aerated bilaterally. No focal infiltrate or sizable effusion is seen. No bony abnormality is noted. Degenerative changes of the thoracic spine are noted. IMPRESSION: No acute abnormality seen. Electronically Signed   By: Inez Catalina M.D.   On: 08/12/2019 02:32   DG Knee Complete 4 Views Right  Result Date: 08/12/2019 CLINICAL DATA:  Status post right knee replacement EXAM: RIGHT KNEE - COMPLETE 4+ VIEW COMPARISON:  None. FINDINGS: Right knee prosthesis is noted in satisfactory position. No acute fracture or dislocation is noted. No soft tissue changes are seen. IMPRESSION: No acute abnormality noted. Electronically Signed   By: Inez Catalina M.D.   On: 08/12/2019 02:32    Procedures Procedures (including critical care time)  Medications Ordered in ED Medications  lactated ringers infusion (has no administration in time  range)  potassium chloride 10 mEq in 100 mL IVPB (10 mEq Intravenous New Bag/Given 08/12/19 0345)  fentaNYL (SUBLIMAZE) injection 25 mcg (has no administration in time range)  metroNIDAZOLE (FLAGYL) IVPB 500 mg (0 mg Intravenous Stopped 08/12/19 0507)  vancomycin (VANCOCIN) IVPB 1000 mg/200 mL premix (1,000 mg Intravenous New Bag/Given 08/12/19 0445)  ceFEPIme (MAXIPIME) 2 g in sodium chloride 0.9 % 100 mL IVPB (0 g Intravenous Stopped 08/12/19 0405)  sodium chloride 0.9 % bolus 500 mL (500 mLs Intravenous New Bag/Given 08/12/19 0455)  acetaminophen (TYLENOL) tablet 1,000 mg (1,000 mg Oral Given 08/12/19 0446)  iohexol (OMNIPAQUE) 350 MG/ML injection 75 mL (75 mLs Intravenous Contrast Given 08/12/19 0420)    ED Course  I have reviewed the triage vital signs and the nursing notes.  Pertinent labs & imaging results that were available during my care of the patient were reviewed  by me and considered in my medical decision making (see chart for details).  Abdomen was initially distended but non tender on exam.  Patient was initially only complaining of R knee pain.  Now pain is stating her abdomen has hurt since Tuesday and the patient has guarding and rebound with her distention.    Patient with free air on CTA  MDM Reviewed: previous chart, nursing note and vitals Reviewed previous: labs Interpretation: labs, x-ray, CT scan and ECG (low white count, NACPD by me on CXR no PE by me on angio) Total time providing critical care: 75-105 minutes (multiple sepsis antibiotics, multiple consults, reassessment, complexity of care. ). This excludes time spent performing separately reportable procedures and services. Consults: admitting MD, general surgery and orthopedics  CRITICAL CARE Performed by: Stevens Magwood K Ozro Russett-Rasch Total critical care time: 90 minutes Critical care time was exclusive of separately billable procedures and treating other patients. Critical care was necessary to treat or prevent imminent or  life-threatening deterioration. Critical care was time spent personally by me on the following activities: development of treatment plan with patient and/or surrogate as well as nursing, discussions with consultants, evaluation of patient's response to treatment, examination of patient, obtaining history from patient or surrogate, ordering and performing treatments and interventions, ordering and review of laboratory studies, ordering and review of radiographic studies, pulse oximetry and re-evaluation of patient's condition.  539 Case d/w Dr. Delfino Lovett, Dr. Alvan Dame to see patient in consult    Patient with known CHF and BP is normal so withheld 30cc/kg bolus as patient is already hypoxic.    535 case d/w Dr. Donne Hazel who will see the patient in consult.  Please admit to medicine    Final Clinical Impression(s) / ED Diagnoses Admit to medicine for multiple medical issues.      Ja Ohman, MD 08/12/19 3559    Veatrice Kells, MD 08/12/19 7416

## 2019-08-12 NOTE — ED Triage Notes (Signed)
Pt. Transported [facility] from home c/o increase knee pain after surgery and increased confusion per her husband.  Per EMS, pt. A&O x3, disoriented to time.

## 2019-08-12 NOTE — ED Notes (Addendum)
In and out cath not performed. Pt. Urinated. Specimen and culture obtained.

## 2019-08-12 NOTE — Progress Notes (Signed)
Patient seen and examined. Pneumoperitoneum on CT scan and peritoneal abdominal exam. To OR emergently for exlap. Informed consent was obtained after detailed explanation of risks, including bleeding, infection, abscess, need for bowel resection, need for ostomy formation. All questions answered to the patient's satisfaction. Husband to be contacted post-procedure, [phone].  Jesusita Oka, MD General and [facility]

## 2019-08-12 NOTE — Anesthesia Postprocedure Evaluation (Signed)
Anesthesia Post Note  Patient: Shannon Moore  Procedure(s) Performed: EXPLORATORY LAPAROTOMY WITH MOBILIZATION OF ASCENDING AND DESCENDING COLON, MOBILIZATION OF DUODENUM, ABDOMINAL [place] OUT AND DRAIN PLACEMENT. (N/A Abdomen)     Patient location during evaluation: PACU Anesthesia Type: General Level of consciousness: awake Pain management: pain level controlled Vital Signs Assessment: post-procedure vital signs reviewed and stable Respiratory status: spontaneous breathing, nonlabored ventilation, respiratory function stable and patient connected to nasal cannula oxygen Cardiovascular status: blood pressure returned to baseline and stable Postop Assessment: no apparent nausea or vomiting Anesthetic complications: no   No complications documented.  Last Vitals:  Vitals:   08/12/19 1300 08/12/19 1316  BP: 106/65 (!) 114/63  Pulse: 105 101  Resp: 12 16  Temp: 36.7 C 36.7 C  SpO2: 96% 95%    Last Pain:  Vitals:   08/12/19 1400  TempSrc:   PainSc: 8                  Alyannah Sanks P Larren Copes

## 2019-08-12 NOTE — ED Notes (Signed)
Report called to short stay.  Patient called her husband and updated them on plans for her to go to surgery

## 2019-08-12 NOTE — Op Note (Signed)
  Operative Note  Date: 08/12/2019  Procedure: exploratory laparotomy, lysis of adhesions, Kocherization of duodenum, takedown of ascending colon, splenic flexure, and descending colon, abdominal washout, placement of JP drains x2  Pre-op diagnosis: pneumoperitoneum Post-op diagnosis: diverticulitis, probable microperforation  Indication and clinical history: The patient is a 74 y.o. year old female with pneumoperitoneum of unknown etiology.   Surgeon: Reather Laurence, MD Assistant: Brantley Stage, MD  Anesthesiologist: Roanna Banning, MD Anesthesia: general  Findings:  . Specimen(s): none . EBL: 25cc . Drains/Implants: JP x2  Disposition: PACU - hemodynamically stable.  Description of procedure: The patient was positioned supine on the operating room table. Time-out was performed verifying correct patient, procedure, signature of informed consent, and administration of pre-operative antibiotics. General anesthetic induction and intubation were uneventful. Foley catheter placement was atraumatic. The abdomen was prepped and draped in the usual sterile fashion. A midline incision was made and deepened down to the fascia. The peritoneal cavity was entered and an audible rush of air was heard. The abdomen was explored carefully. The omentum was adhesed in the left lower quadrant and this was gently mobilized. The ligament of Treitz was identified and the small bowel run in its entirety. Blunt lysis of adhesions was perfomed in order to mobilize the bowel. No perforation was identified. The colon was inspected in its entirety and no perforation was identified, although there were some inflammatory changes of the epiploic fat of the sigmoid colon. The stomach was then inspected and no perforation noted of the anterior surface. The NG tube was confirmed to be in good position. The lesser sac was entered and no perforation noted to posterior wall of the stomach. Next, the small bowel was run again in its entirety,  again with no perforation identified. The ascending colon, splenic flexure, and descending colon were mobilized and again no perforation identified. The duodenum was mobilized and the anterior and posterior surface were without perforation. The abdomen was copiously irrigated with at least five liters of saline. Two JP drains were placed in the left abdomen, one laying in the left paracolic gutter (superior drain) and one in the pelvis (inferior drain). Total time spent performing adhesiolysis was 30 minutes. The fascia was closed with #1 looped PDS in a running fashion. The skin was closed with staples. All sponge and instrument counts were correct at the conclusion of the procedure. The patient was awakened from anesthesia, extubated uneventfully, and transported to the PACU in stable condition.   Clinical update provided to the patient's husband via phone.   Jesusita Oka, MD General and [place] [facility]

## 2019-08-12 NOTE — Transfer of Care (Signed)
Immediate Anesthesia Transfer of Care Note  Patient: Shannon Moore  Procedure(s) Performed: EXPLORATORY LAPAROTOMY WITH MOBILIZATION OF ASCENDING AND DESCENDING COLON, MOBILIZATION OF DUODENUM, ABDOMINAL [place] OUT AND DRAIN PLACEMENT. (N/A Abdomen)  Patient Location: PACU  Anesthesia Type:General  Level of Consciousness: awake and alert   Airway & Oxygen Therapy: Patient Spontanous Breathing and Patient connected to nasal cannula oxygen  Post-op Assessment: Report given to RN and Post -op Vital signs reviewed and stable  Post vital signs: Reviewed and stable  Last Vitals:  Vitals Value Taken Time  BP    Temp    Pulse    Resp    SpO2    HR 104, RR 16, sats 95% on [place], BP 112/63  Last Pain:  Vitals:   08/12/19 0846  TempSrc:   PainSc: 3       Patients Stated Pain Goal: 2 (30/16/01 0932)  Complications: No complications documented.

## 2019-08-12 NOTE — Consult Note (Signed)
Reason for Consult: status post right total knee replacement  Referring Physician: Bobbye Morton, MD   Shannon Moore is an 74 y.o. female.  HPI: 74 yo female status post right total knee replacement Tuesday 08/07/19.  She had an uneventful hospital stay and was discharged to home POD#1 Presents to the ER this am with increasing abdominal pain. Decreased appetite Reports flatus but limited bowel movements post op No increasing pain in right knee other than expected post op pain  Past Medical History:  Diagnosis Date  . Anginal pain ([place])   . Arthritis    Hands, Knees hips, feet,   . CHF (congestive heart failure) ([place]) 1990s   pt feels it is from Zoloft or a virus  . Essential hypertension 01/08/2014  . GERD (gastroesophageal reflux disease)   . Goiter   . Gout   . Hyperlipidemia 01/08/2014  . Obesity (BMI 30-39.9) 01/08/2014  . Type 2 diabetes mellitus without complication ([place]) 32/99/2426    Past Surgical History:  Procedure Laterality Date  . CARDIAC SURGERY    . DIAGNOSTIC LAPAROSCOPY    . EYE SURGERY     bilateral  RK 10 yrs ago  . left and right knee arthroscopies- left 6 months ago    . LEFT HEART CATHETERIZATION WITH CORONARY ANGIOGRAM N/A 01/10/2014   Procedure: LEFT HEART CATHETERIZATION WITH CORONARY ANGIOGRAM;  Surgeon: Jacolyn Reedy, MD;  Location: [facility] CATH LAB;  Service: Cardiovascular;  Laterality: N/A;  . removal of bladder tumor    . TOTAL KNEE ARTHROPLASTY  11/16/2010   Procedure: TOTAL KNEE ARTHROPLASTY;  Surgeon: Mauri Pole;  Location: [facility] ORS;  Service: Orthopedics;  Laterality: Left;  . TOTAL KNEE ARTHROPLASTY Right 08/07/2019   Procedure: TOTAL KNEE ARTHROPLASTY;  Surgeon: Paralee Cancel, MD;  Location: [facility] ORS;  Service: Orthopedics;  Laterality: Right;  70 mins  . TUBAL LIGATION      Family History  Family history unknown: Yes    Social History:  reports that she quit smoking about 41 years ago. She has a 8.00 pack-year smoking history. She has never  used smokeless tobacco. She reports current alcohol use of about 1.0 standard drink of alcohol per week. She reports that she does not use drugs.  Allergies:  Allergies  Allergen Reactions  . Atorvastatin Other (See Comments)    Intolerance   . Sertraline Other (See Comments)    Congestive heart failure.  . Sulfa Antibiotics Itching  . Sulfasalazine Itching  . Penicillins Rash    Tolerated Cephalosporin Date: 08/07/19.  Has patient had a PCN reaction causing immediate rash, facial/tongue/throat swelling, SOB or lightheadedness with hypotension: Yes Has patient had a PCN reaction causing severe rash involving mucus membranes or skin necrosis: No Has patient had a PCN reaction that required hospitalization: No Has patient had a PCN reaction occurring within the last 10 years: No If all of the above answers are "NO", then may proceed with Cephalosporin use.     Medications: I have reviewed the patient's current medications. Scheduled:   Results for orders placed or performed during the hospital encounter of 08/12/19 (from the past 24 hour(s))  Lactic acid, plasma     Status: None   Collection Time: 08/12/19  2:02 AM  Result Value Ref Range   Lactic Acid, Venous 1.5 0.5 - 1.9 mmol/L  Comprehensive metabolic panel     Status: Abnormal   Collection Time: 08/12/19  2:02 AM  Result Value Ref Range   Sodium 133 (L) 135 -  145 mmol/L   Potassium 2.9 (L) 3.5 - 5.1 mmol/L   Chloride 92 (L) 98 - 111 mmol/L   CO2 20 (L) 22 - 32 mmol/L   Glucose, Bld 181 (H) 70 - 99 mg/dL   BUN 12 8 - 23 mg/dL   Creatinine, Ser 1.20 (H) 0.44 - 1.00 mg/dL   Calcium 8.6 (L) 8.9 - 10.3 mg/dL   Total Protein 6.2 (L) 6.5 - 8.1 g/dL   Albumin 2.4 (L) 3.5 - 5.0 g/dL   AST 12 (L) 15 - 41 U/L   ALT 10 0 - 44 U/L   Alkaline Phosphatase 84 38 - 126 U/L   Total Bilirubin 1.8 (H) 0.3 - 1.2 mg/dL   GFR calc non Af Amer 44 (L) >60 mL/min   GFR calc Af Amer 52 (L) >60 mL/min   Anion gap 21 (H) 5 - 15  CBC WITH  DIFFERENTIAL     Status: Abnormal   Collection Time: 08/12/19  2:02 AM  Result Value Ref Range   WBC 4.9 4.0 - 10.5 K/uL   RBC 3.75 (L) 3.87 - 5.11 MIL/uL   Hemoglobin 11.2 (L) 12.0 - 15.0 g/dL   HCT 34.9 (L) 36 - 46 %   MCV 93.1 80.0 - 100.0 fL   MCH 29.9 26.0 - 34.0 pg   MCHC 32.1 30.0 - 36.0 g/dL   RDW 13.5 11.5 - 15.5 %   Platelets 362 150 - 400 K/uL   nRBC 0.4 (H) 0.0 - 0.2 %   Neutrophils Relative % 75 %   Neutro Abs 3.7 1.7 - 7.7 K/uL   Lymphocytes Relative 9 %   Lymphs Abs 0.4 (L) 0.7 - 4.0 K/uL   Monocytes Relative 8 %   Monocytes Absolute 0.4 0 - 1 K/uL   Eosinophils Relative 2 %   Eosinophils Absolute 0.1 0 - 0 K/uL   Basophils Relative 0 %   Basophils Absolute 0.0 0 - 0 K/uL   WBC Morphology VACUOLATED NEUTROPHILS    Immature Granulocytes 6 %   Abs Immature Granulocytes 0.27 (H) 0.00 - 0.07 K/uL  Protime-INR     Status: None   Collection Time: 08/12/19  2:02 AM  Result Value Ref Range   Prothrombin Time 14.1 11.4 - 15.2 seconds   INR 1.1 0.8 - 1.2  APTT     Status: None   Collection Time: 08/12/19  2:02 AM  Result Value Ref Range   aPTT 24 24 - 36 seconds  Troponin I (High Sensitivity)     Status: Abnormal   Collection Time: 08/12/19  2:02 AM  Result Value Ref Range   Troponin I (High Sensitivity) 52 (H) <18 ng/L  Lactic acid, plasma     Status: None   Collection Time: 08/12/19  3:25 AM  Result Value Ref Range   Lactic Acid, Venous 1.7 0.5 - 1.9 mmol/L  SARS Coronavirus 2 by RT PCR (hospital order, performed in [place] hospital lab) Nasopharyngeal Nasopharyngeal Swab     Status: None   Collection Time: 08/12/19  4:53 AM   Specimen: Nasopharyngeal Swab  Result Value Ref Range   SARS Coronavirus 2 NEGATIVE NEGATIVE  Troponin I (High Sensitivity)     Status: Abnormal   Collection Time: 08/12/19  5:17 AM  Result Value Ref Range   Troponin I (High Sensitivity) 39 (H) <18 ng/L  Urinalysis, Routine w reflex microscopic     Status: Abnormal   Collection  Time: 08/12/19  5:54 AM  Result Value  Ref Range   Color, Urine AMBER (A) YELLOW   APPearance CLOUDY (A) CLEAR   Specific Gravity, Urine 1.042 (H) 1.005 - 1.030   pH 5.0 5.0 - 8.0   Glucose, UA NEGATIVE NEGATIVE mg/dL   Hgb urine dipstick NEGATIVE NEGATIVE   Bilirubin Urine NEGATIVE NEGATIVE   Ketones, ur 80 (A) NEGATIVE mg/dL   Protein, ur 100 (A) NEGATIVE mg/dL   Nitrite NEGATIVE NEGATIVE   Leukocytes,Ua MODERATE (A) NEGATIVE   RBC / HPF 6-10 0 - 5 RBC/hpf   WBC, UA 21-50 0 - 5 WBC/hpf   Bacteria, UA NONE SEEN NONE SEEN   Squamous Epithelial / LPF 6-10 0 - 5   Mucus PRESENT    Amorphous Crystal PRESENT    Non Squamous Epithelial 0-5 (A) NONE SEEN  Glucose, capillary     Status: Abnormal   Collection Time: 08/12/19  8:54 AM  Result Value Ref Range   Glucose-Capillary 233 (H) 70 - 99 mg/dL  Type and screen [place]     Status: None   Collection Time: 08/12/19  9:15 AM  Result Value Ref Range   ABO/RH(D) B POS    Antibody Screen NEG    Sample Expiration      08/15/2019,2359 Performed at [facility], [address] [place], [place] [address]   Glucose, capillary     Status: Abnormal   Collection Time: 08/12/19 11:30 AM  Result Value Ref Range   Glucose-Capillary 195 (H) 70 - 99 mg/dL    X-ray: CLINICAL DATA:  Status post right knee replacement  EXAM: RIGHT KNEE - COMPLETE 4+ VIEW  COMPARISON:  None.  FINDINGS: Right knee prosthesis is noted in satisfactory position. No acute fracture or dislocation is noted. No soft tissue changes are seen.  IMPRESSION: No acute abnormality noted.   Electronically Signed   By: Inez Catalina M.D.  ROS: As noted in ER and admission noted   Blood pressure (!) 117/63, pulse 102, temperature 98.2 F (36.8 C), resp. rate 17, height 5\' 1"  (1.549 m), weight 74.8 kg, SpO2 96 %.  Physical Exam  Awake alert this am Right knee exam Surgical dressing had been removed Right knee incision healing  without issue, no drainage, mild expected erythema and warmth  General exam per admission particularly as it pertains to her abdominal exam: Constitutional:      Appearance: She is obese. She is not toxic-appearing.  HENT:     Head: Normocephalic and atraumatic.     Mouth/Throat:     Mouth: Mucous membranes are moist.     Pharynx: Oropharynx is clear.  Eyes:     Extraocular Movements: Extraocular movements intact.     Conjunctiva/sclera: Conjunctivae normal.  Cardiovascular:     Rate and Rhythm: Regular rhythm. Tachycardia present.     Pulses: Normal pulses.  Pulmonary:     Effort: Pulmonary effort is normal.     Breath sounds: Normal breath sounds.  Abdominal:     General: There is distension.     Tenderness: There is abdominal tenderness (left midabdomen).     Hernia: No hernia is present.  Skin:    General: Skin is warm and dry.     Capillary Refill: Capillary refill takes less than 2 seconds.  Neurological:     General: No focal deficit present.     Mental Status: She is alert.  Psychiatric:        Mood and Affect: Mood normal.  Behavior: Behavior normal  Assessment/Plan: 1. Status post right total knee without issue 2. Concern for free air in abdomen following radiographic workup  Plan: As for right knee she will need PT for knee exercise and OOB activity as tolerable WBAT I will order a CPM to be used while in hospital in case of need for bedrest Otherwise she has scheduled follow up with [place] next week  Mauri Pole 08/12/2019, 11:34 AM

## 2019-08-13 ENCOUNTER — Encounter (HOSPITAL_COMMUNITY): Payer: Self-pay | Admitting: Surgery

## 2019-08-13 DIAGNOSIS — E785 Hyperlipidemia, unspecified: Secondary | ICD-10-CM

## 2019-08-13 DIAGNOSIS — K668 Other specified disorders of peritoneum: Secondary | ICD-10-CM

## 2019-08-13 DIAGNOSIS — I10 Essential (primary) hypertension: Secondary | ICD-10-CM

## 2019-08-13 LAB — COMPREHENSIVE METABOLIC PANEL
ALT: 26 U/L (ref 0–44)
AST: 30 U/L (ref 15–41)
Albumin: 2 g/dL — ABNORMAL LOW (ref 3.5–5.0)
Alkaline Phosphatase: 56 U/L (ref 38–126)
Anion gap: 11 (ref 5–15)
BUN: 23 mg/dL (ref 8–23)
CO2: 28 mmol/L (ref 22–32)
Calcium: 8.3 mg/dL — ABNORMAL LOW (ref 8.9–10.3)
Chloride: 99 mmol/L (ref 98–111)
Creatinine, Ser: 1.3 mg/dL — ABNORMAL HIGH (ref 0.44–1.00)
GFR calc Af Amer: 47 mL/min — ABNORMAL LOW (ref >60)
GFR calc non Af Amer: 40 mL/min — ABNORMAL LOW (ref >60)
Glucose, Bld: 169 mg/dL — ABNORMAL HIGH (ref 70–99)
Potassium: 4.4 mmol/L (ref 3.5–5.1)
Sodium: 138 mmol/L (ref 135–145)
Total Bilirubin: 0.9 mg/dL (ref 0.3–1.2)
Total Protein: 5.2 g/dL — ABNORMAL LOW (ref 6.5–8.1)

## 2019-08-13 LAB — CBC WITH DIFFERENTIAL/PLATELET
Abs Immature Granulocytes: 0 10*3/uL (ref 0.00–0.07)
Basophils Absolute: 0 10*3/uL (ref 0.0–0.1)
Basophils Relative: 0 %
Eosinophils Absolute: 0 10*3/uL (ref 0.0–0.5)
Eosinophils Relative: 0 %
HCT: 31.1 % — ABNORMAL LOW (ref 36.0–46.0)
Hemoglobin: 10.2 g/dL — ABNORMAL LOW (ref 12.0–15.0)
Lymphocytes Relative: 11 %
Lymphs Abs: 1.7 10*3/uL (ref 0.7–4.0)
MCH: 29.7 pg (ref 26.0–34.0)
MCHC: 32.8 g/dL (ref 30.0–36.0)
MCV: 90.4 fL (ref 80.0–100.0)
Monocytes Absolute: 0.6 10*3/uL (ref 0.1–1.0)
Monocytes Relative: 4 %
Neutro Abs: 13.2 10*3/uL — ABNORMAL HIGH (ref 1.7–7.7)
Neutrophils Relative %: 85 %
Platelets: 370 10*3/uL (ref 150–400)
RBC: 3.44 MIL/uL — ABNORMAL LOW (ref 3.87–5.11)
RDW: 13.6 % (ref 11.5–15.5)
WBC: 15.5 10*3/uL — ABNORMAL HIGH (ref 4.0–10.5)
nRBC: 0 /100 WBC
nRBC: 0.1 % (ref 0.0–0.2)

## 2019-08-13 LAB — URINE CULTURE: Special Requests: NORMAL

## 2019-08-13 LAB — GLUCOSE, CAPILLARY
Glucose-Capillary: 188 mg/dL — ABNORMAL HIGH (ref 70–99)
Glucose-Capillary: 150 mg/dL — ABNORMAL HIGH (ref 70–99)
Glucose-Capillary: 147 mg/dL — ABNORMAL HIGH (ref 70–99)
Glucose-Capillary: 182 mg/dL — ABNORMAL HIGH (ref 70–99)
Glucose-Capillary: 139 mg/dL — ABNORMAL HIGH (ref 70–99)

## 2019-08-13 LAB — MAGNESIUM: Magnesium: 1.5 mg/dL — ABNORMAL LOW (ref 1.7–2.4)

## 2019-08-13 MED ORDER — MORPHINE SULFATE (PF) 2 MG/ML IV SOLN
2.0000 mg | INTRAVENOUS | Status: AC | PRN
  Administered 2019-08-13 – 2019-08-14 (×4): 2 mg via INTRAVENOUS
  Filled 2019-08-13 (×4): qty 1

## 2019-08-13 MED ORDER — CHLORHEXIDINE GLUCONATE CLOTH 2 % EX PADS
6.0000 | MEDICATED_PAD | Freq: Every day | CUTANEOUS | Status: DC
  Administered 2019-08-13 – 2019-08-17 (×3): 6 via TOPICAL

## 2019-08-13 MED ORDER — KCL IN DEXTROSE-NACL 20-5-0.45 MEQ/L-%-% IV SOLN
INTRAVENOUS | Status: DC
  Filled 2019-08-13 (×6): qty 1000

## 2019-08-13 MED ORDER — METHOCARBAMOL 1000 MG/10ML IJ SOLN
500.0000 mg | Freq: Four times a day (QID) | INTRAVENOUS | Status: DC
  Administered 2019-08-13 – 2019-08-18 (×18): 500 mg via INTRAVENOUS
  Filled 2019-08-13 (×8): qty 5
  Filled 2019-08-13: qty 500
  Filled 2019-08-13 (×14): qty 5
  Filled 2019-08-13 (×2): qty 500

## 2019-08-13 MED ORDER — MAGNESIUM SULFATE 50 % IJ SOLN
3.0000 g | Freq: Once | INTRAVENOUS | Status: AC
  Administered 2019-08-13: 3 g via INTRAVENOUS
  Filled 2019-08-13: qty 6

## 2019-08-13 MED ORDER — ACETAMINOPHEN 10 MG/ML IV SOLN
1000.0000 mg | Freq: Four times a day (QID) | INTRAVENOUS | Status: AC
  Administered 2019-08-13 – 2019-08-14 (×4): 1000 mg via INTRAVENOUS
  Filled 2019-08-13 (×4): qty 100

## 2019-08-13 MED ORDER — METHOCARBAMOL 500 MG PO TABS
1000.0000 mg | ORAL_TABLET | Freq: Three times a day (TID) | ORAL | Status: DC
  Administered 2019-08-13: 1000 mg
  Filled 2019-08-13: qty 2

## 2019-08-13 MED ORDER — SODIUM CHLORIDE 0.9 % IV BOLUS
1000.0000 mL | Freq: Once | INTRAVENOUS | Status: AC
  Administered 2019-08-13: 1000 mL via INTRAVENOUS

## 2019-08-13 MED ORDER — LIDOCAINE 5 % EX PTCH
1.0000 | MEDICATED_PATCH | CUTANEOUS | Status: DC
  Administered 2019-08-13 – 2019-08-18 (×6): 1 via TRANSDERMAL
  Filled 2019-08-13 (×6): qty 1

## 2019-08-13 NOTE — Progress Notes (Signed)
Patient is placed on CPM, not comfortable going higher than 40. Will continue to monitor.

## 2019-08-13 NOTE — Evaluation (Signed)
Occupational Therapy Evaluation Patient Details Name: Shannon Moore MRN: [id number] DOB: 1945/11/11 Today's Date: 08/13/2019    History of Present Illness Patient is 74 y.o. female admitted on 08/12/19 with abdominal pain. Underwent ex lap of colon, mobilization of duodenum, abdominal wash out with placement of drains and lysis of adhesions same day. PMH: Rt TKA on 08/07/19 DM, HTN, HLD, CHF, OA, GERD, gout, obesity, Lt TKA in 2012.   Clinical Impression   Pt was independent prior to her R TKA on 7/27. Presents with abdominal pain, generalized weakness and poor standing balance. She requires mod to max assist for mobility with second person for safety due to multiple lines. Pt requires set up to total assist for ADL. Anticipate she will progress to be able to return home with her supportive husband when medically stable. Will follow acutely. Of note: Pt with Sp02 down to 87% on RA, up to 94% on 1L after incentive spirometer use. RN made aware.     Follow Up Recommendations  Home health OT;Supervision/Assistance - 24 hour    Equipment Recommendations  3 in 1 bedside commode    Recommendations for Other Services       Precautions / Restrictions Precautions Precautions: Fall Precaution Comments: NGT, 2 drains Restrictions Weight Bearing Restrictions: Yes RLE Weight Bearing: Weight bearing as tolerated      Mobility Bed Mobility Overal bed mobility: Needs Assistance Bed Mobility: Rolling;Sidelying to Sit Rolling: Max assist Sidelying to sit: Max assist       General bed mobility comments: cues for log roll technique, use of rail, assist with bed pad to roll, for LEs over EOB and to raise trunk  Transfers Overall transfer level: Needs assistance Equipment used: Rolling walker (2 wheeled) Transfers: Sit to/from [facility] Sit to Stand: Mod assist;+2 safety/equipment Stand pivot transfers: Min assist;+2 safety/equipment       General transfer comment: cues  for hand placement, assist to rise and steady, second person for safety and line management    Balance Overall balance assessment: Needs assistance   Sitting balance-Leahy Scale: Fair Sitting balance - Comments: props on at least one hand due to pain   Standing balance support: During functional activity;Bilateral upper extremity supported Standing balance-Leahy Scale: Poor                             ADL either performed or assessed with clinical judgement   ADL Overall ADL's : Needs assistance/impaired Eating/Feeding: Set up;Sitting Eating/Feeding Details (indicate cue type and reason): ice chips Grooming: Brushing hair;Sitting;Set up   Upper Body Bathing: Moderate assistance;Sitting   Lower Body Bathing: Total assistance;Sit to/from stand   Upper Body Dressing : Moderate assistance;Sitting   Lower Body Dressing: Total assistance;Sit to/from stand   Toilet Transfer: Minimal assistance;+2 for safety/equipment;Stand-pivot;RW                   Vision Patient Visual Report: No change from baseline       Perception     Praxis      Pertinent Vitals/Pain Pain Assessment: Faces Faces Pain Scale: Hurts even more Pain Location: abdomen Pain Descriptors / Indicators: Moaning;Grimacing;Guarding Pain Intervention(s): Monitored during session;Premedicated before session;Repositioned     Hand Dominance Right   Extremity/Trunk Assessment Upper Extremity Assessment Upper Extremity Assessment: Overall WFL for tasks assessed   Lower Extremity Assessment Lower Extremity Assessment: Defer to PT evaluation   Cervical / Trunk Assessment Cervical / Trunk Assessment: Other  exceptions Cervical / Trunk Exceptions: abdominal incision/pain   Communication Communication Communication: No difficulties   Cognition Arousal/Alertness: Awake/alert Behavior During Therapy: WFL for tasks assessed/performed Overall Cognitive Status: Within Functional Limits for tasks  assessed                                     General Comments       Exercises     Shoulder Instructions      Home Living Family/patient expects to be discharged to:: Private residence Living Arrangements: Spouse/significant other;Children Available Help at Discharge: Family;Available 24 hours/day Type of Home: House Home Access: Stairs to enter [employer] of Steps: 1+1 Entrance Stairs-Rails: None Home Layout: One level     Bathroom Shower/Tub: Occupational psychologist: Standard     Home Equipment: Shower seat;Cane - single point;Walker - 2 wheels          Prior Functioning/Environment Level of Independence: Independent (prior to TKA)                 OT Problem List: Decreased strength;Decreased activity tolerance;Impaired balance (sitting and/or standing);Decreased knowledge of use of DME or AE;Pain;Obesity      OT Treatment/Interventions: Self-care/ADL training;DME and/or AE instruction;Patient/family education;Balance training;Therapeutic activities    OT Goals(Current goals can be found in the care plan section) Acute Rehab OT Goals Patient Stated Goal: return home OT Goal Formulation: With patient Time For Goal Achievement: 08/27/19 Potential to Achieve Goals: Good ADL Goals Pt Will Perform Grooming: with supervision;standing Pt Will Perform Upper Body Bathing: with set-up;sitting Pt Will Perform Lower Body Bathing: sit to/from stand;with caregiver independent in assisting;with min assist;with adaptive equipment Pt Will Perform Upper Body Dressing: with set-up;sitting Pt Will Perform Lower Body Dressing: with min assist;with caregiver independent in assisting;with adaptive equipment;sit to/from stand Pt Will Transfer to Toilet: with supervision;ambulating;bedside commode (over toilet) Pt Will Perform Toileting - Clothing Manipulation and hygiene: with supervision;sit to/from stand Additional ADL Goal #1: Pt will  perform bed mobility with min assist using log roll technique in preparation for ADL.  OT Frequency: Min 2X/week   Barriers to D/C:            Co-evaluation PT/OT/SLP Co-Evaluation/Treatment: Yes Reason for Co-Treatment: For patient/therapist safety   OT goals addressed during session: ADL's and self-care      AM-PAC OT "6 Clicks" Daily Activity     Outcome Measure Help from another person eating meals?: None Help from another person taking care of personal grooming?: A Little Help from another person toileting, which includes using toliet, bedpan, or urinal?: A Lot Help from another person bathing (including washing, rinsing, drying)?: A Lot Help from another person to put on and taking off regular upper body clothing?: A Lot Help from another person to put on and taking off regular lower body clothing?: Total 6 Click Score: 14   End of Session Equipment Utilized During Treatment: Rolling walker Nurse Communication: Mobility status;Other (comment) (on 1L 02)  Activity Tolerance: Patient limited by pain Patient left: in chair;with call bell/phone within reach;with chair alarm set;with family/visitor present  OT Visit Diagnosis: Unsteadiness on feet (R26.81);Other abnormalities of gait and mobility (R26.89);Pain;Muscle weakness (generalized) (M62.81)                Time: 1354-1430 OT Time Calculation (min): 36 min Charges:  OT General Charges $OT Visit: 1 Visit OT Evaluation $OT Eval Moderate Complexity: 1 Mod  Nestor Lewandowsky, OTR/L Acute Rehabilitation Services Pager: [phone] Office: [phone]  Malka So 08/13/2019, 2:53 PM

## 2019-08-13 NOTE — Evaluation (Signed)
Physical Therapy Evaluation Patient Details Name: Shannon Moore MRN: [id number] DOB: [birth year] Today's Date: 08/13/2019   History of Present Illness  Patient is 74 y.o. female admitted on 08/12/19 with abdominal pain. Underwent ex lap of colon, mobilization of duodenum, abdominal wash out with placement of drains and lysis of adhesions same day. PMH: Rt TKA on 08/07/19 DM, HTN, HLD, CHF, OA, GERD, gout, obesity, Lt TKA in 2012.  Clinical Impression  Pt in bed upon arrival of PT, agreeable to evaluation at this time. Prior to admission for her TKA, the pt was independent with all mobility and self-care, but was able to be d/c home with RW for ambulation and had planned to start OPPT. The pt now presents with limitations in functional mobility, activity tolerance, endurance, and stability due to above dx, and will continue to benefit from skilled PT to address these deficits. The pt was able to complete multiple transfers and short lateral ambulation with minA of 2 for safety and line management. The pt will continue to benefit from skilled PT to continue progression of exercises for R knee ROM and strengthening as well as general progression of endurance and activity prior to return home.     Follow Up Recommendations Home health PT (may be able to return to [place] depending on pt progression)    Equipment Recommendations  None recommended by PT    Recommendations for Other Services       Precautions / Restrictions Precautions Precautions: Fall Precaution Comments: NGT, 2 drains Restrictions Weight Bearing Restrictions: Yes RLE Weight Bearing: Weight bearing as tolerated Other Position/Activity Restrictions: WBAT      Mobility  Bed Mobility Overal bed mobility: Needs Assistance Bed Mobility: Rolling;Sidelying to Sit Rolling: Max assist Sidelying to sit: Max assist       General bed mobility comments: cues for log roll technique, use of rail, assist with bed pad to roll, for LEs over  EOB and to raise trunk, +2 is helpful for line management and BLE movement  Transfers Overall transfer level: Needs assistance Equipment used: Rolling walker (2 wheeled) Transfers: Sit to/from [facility] Sit to Stand: +2 safety/equipment;Min assist Stand pivot transfers: +2 physical assistance;Min assist       General transfer comment: minA of 2 to power up from bed. VC for hand placement, minA to steady in standing.  Ambulation/Gait Ambulation/Gait assistance: Min guard Gait Distance (Feet): 5 Feet Assistive device: Rolling walker (2 wheeled) Gait Pattern/deviations: Decreased stride length;Step-to pattern Gait velocity: decr Gait velocity interpretation: <1.31 ft/sec, indicative of household ambulator General Gait Details: pt with small, lateral steps to recliner from EOB. no assist needed for RW managment, but small steps with minimal clearance.      Balance Overall balance assessment: Needs assistance Sitting-balance support: Feet supported Sitting balance-Leahy Scale: Fair Sitting balance - Comments: props on at least one hand due to pain   Standing balance support: During functional activity;Bilateral upper extremity supported Standing balance-Leahy Scale: Poor                               Pertinent Vitals/Pain Pain Assessment: Faces Faces Pain Scale: Hurts even more Pain Location: abdomen Pain Descriptors / Indicators: Moaning;Grimacing;Guarding Pain Intervention(s): Monitored during session;Premedicated before session;Repositioned;Relaxation    Home Living Family/patient expects to be discharged to:: Private residence Living Arrangements: Spouse/significant other;Children Available Help at Discharge: Family;Available 24 hours/day Type of Home: House Home Access: Stairs to enter Entrance Stairs-Rails:  None Entrance Stairs-Number of Steps: 1+1 Home Layout: One level Home Equipment: Shower seat;Cane - single point;Walker - 2  wheels      Prior Function Level of Independence: Independent         Comments: prior to TKA the pt was completely independent. 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)     Journalist, newspaper   Dominant Hand: Right    Extremity/Trunk Assessment   Upper Extremity Assessment Upper Extremity Assessment: Overall WFL for tasks assessed    Lower Extremity Assessment Lower Extremity Assessment: Overall WFL for tasks assessed;RLE deficits/detail RLE Deficits / Details: good quad activation, no extensor lag with SLR RLE Sensation: WNL    Cervical / Trunk Assessment Cervical / Trunk Assessment: Other exceptions Cervical / Trunk Exceptions: abdominal incision/pain  Communication   Communication: No difficulties  Cognition Arousal/Alertness: Awake/alert Behavior During Therapy: WFL for tasks assessed/performed Overall Cognitive Status: Within Functional Limits for tasks assessed                                        General Comments      Exercises Total Joint Exercises Ankle Circles/Pumps: AROM;5 reps;Right;Supine Quad Sets: AROM;Right;10 reps;Supine Heel Slides: AROM;Right;10 reps;Seated   Assessment/Plan    PT Assessment Patient needs continued PT services  PT Problem List Decreased strength;Decreased range of motion;Decreased activity tolerance;Decreased balance;Decreased mobility;Decreased knowledge of use of DME       PT Treatment Interventions DME instruction;Gait training;Stair training;Functional mobility training;Therapeutic activities;Therapeutic exercise;Balance training;Patient/family education    PT Goals (Current goals can be found in the Care Plan section)  Acute Rehab PT Goals Patient Stated Goal: return home PT Goal Formulation: With patient Time For Goal Achievement: 08/27/19 Potential to Achieve Goals: Good    Frequency Min 5X/week   Barriers to discharge        Co-evaluation PT/OT/SLP  Co-Evaluation/Treatment: Yes Reason for Co-Treatment: For patient/therapist safety PT goals addressed during session: Mobility/safety with mobility;Strengthening/ROM;Balance OT goals addressed during session: ADL's and self-care       AM-PAC PT "6 Clicks" Mobility  Outcome Measure Help needed turning from your back to your side while in a flat bed without using bedrails?: A Lot Help needed moving from lying on your back to sitting on the side of a flat bed without using bedrails?: A Lot Help needed moving to and from a bed to a chair (including a wheelchair)?: A Little Help needed standing up from a chair using your arms (e.g., wheelchair or bedside chair)?: A Little Help needed to walk in hospital room?: A Lot Help needed climbing 3-5 steps with a railing? : A Lot 6 Click Score: 14    End of Session Equipment Utilized During Treatment: Gait belt Activity Tolerance: Patient tolerated treatment well Patient left: with call bell/phone within reach;in chair;with family/visitor present Nurse Communication: Mobility status PT Visit Diagnosis: Pain;Other abnormalities of gait and mobility (R26.89) Pain - Right/Left: Right Pain - part of body: Knee    Time: 1354-1430 PT Time Calculation (min) (ACUTE ONLY): 36 min   Charges:   PT Evaluation $PT Eval Moderate Complexity: 1 Mod          Karma Ganja, PT, DPT   Acute Rehabilitation Department Pager #: [phone]  Otho Bellows 08/13/2019, 4:13 PM

## 2019-08-13 NOTE — Progress Notes (Signed)
[facility] Progress Note  1 Day Post-Op  Subjective: CC:  NAEO. Abdomen is sore. Reports using CPM for knee this AM. No flatus or BM yet. States at baseline she lives with her husband and son. She walks without an assistive device and drives a car. NG 950 cc/24h  Objective: Vital signs in last 24 hours: Temp:  [97.8 F (36.6 C)-98.6 F (37 C)] 98.6 F (37 C) (08/02 0927) Pulse Rate:  [98-110] 101 (08/02 0927) Resp:  [12-17] 17 (08/02 0927) BP: (104-125)/(52-71) 112/62 (08/02 0927) SpO2:  [95 %-98 %] 96 % (08/02 0927) Last BM Date: 08/06/19  Intake/Output from previous day: 08/01 0701 - 08/02 0700 In: 2950 [I.V.:2300; IV Piggyback:650] Out: 1895 [Urine:575; Emesis/NG output:950; Drains:270; Blood:100] Intake/Output this shift: No intake/output data recorded.  PE: Gen:  Alert, NAD, pleasant Card:  Regular rate and rhythm, pedal pulses 2+ BL Pulm:  Normal effort, clear to auscultation bilaterally Abd: Soft, approp tender, mild distention, JPx2 LLQ SS  GU: foley in place, UOP 575 cc documented last 24h, dark yellow Skin: warm and dry, no rashes  Psych: A&Ox3   Lab Results:  Recent Labs    08/12/19 0202 08/13/19 0812  WBC 4.9 15.5*  HGB 11.2* 10.2*  HCT 34.9* 31.1*  PLT 362 370   BMET Recent Labs    08/12/19 0517 08/12/19 0517 08/12/19 2207 08/13/19 0812  NA 134*  --   --  138  K 3.2*   < > 4.1 4.4  CL 93*  --   --  99  CO2 24  --   --  28  GLUCOSE 200*  --   --  169*  BUN 14  --   --  23  CREATININE 1.24*  --   --  1.30*  CALCIUM 8.4*  --   --  8.3*   < > = values in this interval not displayed.   PT/INR Recent Labs    08/12/19 0202  LABPROT 14.1  INR 1.1   CMP     Component Value Date/Time   NA 138 08/13/2019 0812   K 4.4 08/13/2019 0812   CL 99 08/13/2019 0812   CO2 28 08/13/2019 0812   GLUCOSE 169 (H) 08/13/2019 0812   BUN 23 08/13/2019 0812   CREATININE 1.30 (H) 08/13/2019 0812   CALCIUM 8.3 (L) 08/13/2019 0812   PROT 5.2  (L) 08/13/2019 0812   ALBUMIN 2.0 (L) 08/13/2019 0812   AST 30 08/13/2019 0812   ALT 26 08/13/2019 0812   ALKPHOS 56 08/13/2019 0812   BILITOT 0.9 08/13/2019 0812   GFRNONAA 40 (L) 08/13/2019 0812   GFRAA 47 (L) 08/13/2019 0812   Lipase  No results found for: LIPASE     Studies/Results: CT Angio Chest PE W and/or Wo Contrast  Result Date: 08/12/2019 CLINICAL DATA:  Confusion.  Abdominal pain.  Recent knee surgery. EXAM: CT ANGIOGRAPHY CHEST WITH CONTRAST TECHNIQUE: Multidetector CT imaging of the chest was performed using the standard protocol during bolus administration of intravenous contrast. Multiplanar CT image reconstructions and MIPs were obtained to evaluate the vascular anatomy. CONTRAST:  35mL OMNIPAQUE IOHEXOL 350 MG/ML SOLN COMPARISON:  None. FINDINGS: Cardiovascular: There is satisfactory opacification of the pulmonary arteries to the segmental level. There is no evidence of a pulmonary embolism. Heart is mildly enlarged. No pericardial effusion mild 2 vessel coronary artery calcifications. Aorta is normal in caliber. No dissection. Mild aortic atherosclerosis. Mediastinum/Nodes: No neck base, axillary, mediastinal or hilar masses or enlarged lymph  nodes. Trachea and esophagus are unremarkable. Lungs/Pleura: Study acquired in expiration. Linear atelectasis noted in the lung bases. No evidence pneumonia or pulmonary edema. No pleural effusion or pneumothorax. Upper Abdomen: Small bubbles of free intraperitoneal air are seen along the anterior abdomen. The etiology of these is unclear. Small sliding hiatal hernia. Musculoskeletal: No fracture or acute finding. No osteoblastic or osteolytic lesions. Review of the MIP images confirms the above findings. IMPRESSION: 1. No evidence of a pulmonary embolism. 2. Small bubbles of free intraperitoneal air noted in the limited visualization of the upper abdomen. The etiology of this unclear. Recommend follow-up abdomen and pelvis CT scan for  further assessment. 3. Mild cardiomegaly.  Mild aortic atherosclerosis. 4. Mild lung atelectasis.  No acute findings in the lungs. Aortic Atherosclerosis (ICD10-I70.0). Electronically Signed   By: Lajean Manes M.D.   On: 08/12/2019 05:01   DG Chest Port 1 View  Result Date: 08/12/2019 CLINICAL DATA:  Possible sepsis EXAM: PORTABLE CHEST 1 VIEW COMPARISON:  12/08/2018 FINDINGS: Cardiac shadow is stable. Mild aortic calcifications are seen. The lungs are well aerated bilaterally. No focal infiltrate or sizable effusion is seen. No bony abnormality is noted. Degenerative changes of the thoracic spine are noted. IMPRESSION: No acute abnormality seen. Electronically Signed   By: Inez Catalina M.D.   On: 08/12/2019 02:32   DG Knee Complete 4 Views Right  Result Date: 08/12/2019 CLINICAL DATA:  Status post right knee replacement EXAM: RIGHT KNEE - COMPLETE 4+ VIEW COMPARISON:  None. FINDINGS: Right knee prosthesis is noted in satisfactory position. No acute fracture or dislocation is noted. No soft tissue changes are seen. IMPRESSION: No acute abnormality noted. Electronically Signed   By: Inez Catalina M.D.   On: 08/12/2019 02:32   DG Abd Portable 1V  Result Date: 08/12/2019 CLINICAL DATA:  NG tube placement EXAM: PORTABLE ABDOMEN - 1 VIEW COMPARISON:  None. FINDINGS: 1237 hours. NG tube tip is in the distal stomach. Nonspecific bowel gas pattern. Surgical drains overlie the left abdomen. IMPRESSION: NG tube tip is in the distal stomach near the pylorus. Electronically Signed   By: Misty Stanley M.D.   On: 08/12/2019 13:01   CT Renal Stone Study  Result Date: 08/12/2019 CLINICAL DATA:  Follow-up from a CT angiogram of the chest that showed a small amount of free intraperitoneal air. Patient with abdominal pain. Recent knee surgery. EXAM: CT ABDOMEN AND PELVIS WITHOUT CONTRAST TECHNIQUE: Multidetector CT imaging of the abdomen and pelvis was performed following the standard protocol without IV contrast.  COMPARISON:  Current CTA chest. FINDINGS: Lower chest: Mild linear atelectasis.  No acute findings. Hepatobiliary: Liver normal in size. No mass or focal lesion. Normal gallbladder. No bile duct dilation. Pancreas: Unremarkable. No pancreatic ductal dilatation or surrounding inflammatory changes. Spleen: Normal in size without focal abnormality. Adrenals/Urinary Tract: No adrenal masses. Mild bilateral renal cortical thinning. Symmetric bilateral renal enhancement and excretion. Small, 7 mm, low-density mass, lower pole the left kidney, consistent with a cyst. 5 mm low-density lesion, upper pole the right kidney, also likely a cyst. No other masses, no stones and no hydronephrosis. Normal ureters. Bladder is unremarkable. Stomach/Bowel: There is free intraperitoneal air. In the left lower quadrant, there is apparent collection with nondependent air, which measures approximately 9 x 3.8 x 6.9 cm. This is potentially a focal area of dilated small bowel, but cannot be confidently connected to the small bowel. It is felt more likely to be an extraluminal collection/abscess. Small bubbles of free intraperitoneal air  lies along the anterior superior margin of this with more significant free air collecting along the anterior aspect of the mid to upper abdomen. There is a smaller apparent collection that lies just superior to the bladder. The left colon is mostly decompressed. There are multiple diverticula most evident along the sigmoid. No convincing diverticulitis. No colonic wall thickening or other inflammatory process. Normal stomach. Prominent loops of proximal small bowel noted in the left mid to upper abdomen, 3.1 cm in diameter mild wall thickening. No other small bowel dilation or wall thickening. Normal appendix visualized. Vascular/Lymphatic: Aortic atherosclerotic calcifications. No aneurysm. No enlarged lymph nodes. Reproductive: Uterus and bilateral adnexa are unremarkable. Other: No ascites.  Musculoskeletal: No fracture or acute finding. No osteoblastic or osteolytic lesions. IMPRESSION: 1. Free intraperitoneal air consistent with a ruptured viscus. The exact site/cause of the perforation is not definitive. There is an apparent collection in the anterior left lower quadrant, 9 cm in greatest dimension. Small additional collection suggested just superior to the bladder. A loop of small bowel in the mid to upper abdomen shows wall thickening and mild distension consistent with infectious or inflammatory enteritis. There are multiple sigmoid colon diverticula, but no evidence of diverticulitis. 2. No other acute abnormality within the abdomen or pelvis. 3. Aortic atherosclerosis. Electronically Signed   By: Lajean Manes M.D.   On: 08/12/2019 06:44    Anti-infectives: Anti-infectives (From admission, onward)   Start     Dose/Rate Route Frequency Ordered Stop   08/12/19 2200  vancomycin (VANCOCIN) IVPB 1000 mg/200 mL premix  Status:  Discontinued        1,000 mg 200 mL/hr over 60 Minutes Intravenous Every 24 hours 08/12/19 0553 08/12/19 1722   08/12/19 1730  cefTRIAXone (ROCEPHIN) 2 g in sodium chloride 0.9 % 100 mL IVPB     Discontinue    "And" Linked Group Details   2 g 200 mL/hr over 30 Minutes Intravenous Every 24 hours 08/12/19 1728 08/16/19 1729   08/12/19 1730  metroNIDAZOLE (FLAGYL) IVPB 500 mg     Discontinue    "And" Linked Group Details   500 mg 100 mL/hr over 60 Minutes Intravenous Every 8 hours 08/12/19 1728 08/16/19 1729   08/12/19 1000  ceFEPIme (MAXIPIME) 2 g in sodium chloride 0.9 % 100 mL IVPB  Status:  Discontinued        2 g 200 mL/hr over 30 Minutes Intravenous Every 12 hours 08/12/19 0553 08/12/19 1722   08/12/19 0145  ceFEPIme (MAXIPIME) 2 g in sodium chloride 0.9 % 100 mL IVPB        2 g 200 mL/hr over 30 Minutes Intravenous  Once 08/12/19 0139 08/12/19 0405   08/12/19 0130  aztreonam (AZACTAM) 2 g in sodium chloride 0.9 % 100 mL IVPB  Status:  Discontinued         2 g 200 mL/hr over 30 Minutes Intravenous  Once 08/12/19 0125 08/12/19 0139   08/12/19 0130  metroNIDAZOLE (FLAGYL) IVPB 500 mg        500 mg 100 mL/hr over 60 Minutes Intravenous  Once 08/12/19 0125 08/12/19 0507   08/12/19 0130  vancomycin (VANCOCIN) IVPB 1000 mg/200 mL premix        1,000 mg 200 mL/hr over 60 Minutes Intravenous  Once 08/12/19 0125 08/12/19 0709       Assessment/Plan 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   FEN: NPO, was not on IVF, has marginal UOP, give 1L bolus then start IVF at 75 cc/hr; replete Magnesium (1.5); K 4.4 ID: Rocephin/Flagyl 8/1 >> VTE: SCD's, Lovenox Foley: continue today for I&Os, plan to D/C Tomorrow Follow up: Dr. Bobbye Morton  Dispo: floor, PT eval, appreciate medicine following for medical management    LOS: 1 day    Obie Dredge, [facility] Please see Amion for pager number during day hours 7:00am-4:30pm

## 2019-08-13 NOTE — Progress Notes (Signed)
Consult follow-up note We will follow along for now-if medical issues are deemed stable likely will sign off after seeing the patient on 8/3-let me know if further needs, thank you   Shannon Moore  EHU:[id number] DOB: [birth year] DOA: 08/12/2019 PCP: Harlan Stains, MD  Brief Narrative:  74 year old white female Prior left knee arthroplasty 2012, HTN DM TY 2 HLD BMI 31 bladder excision BTL in the past, normal Bundle branch right TKR 08/08/2019 Dr.Olin Return to ED 8/1-complains of knee pain but increasing abdominal pain-also was confused reported fever eventually found on CT abdomen pelvis to have perforated viscus General surgery is attending physician we will manage medical issues  Assessment & Plan:   Active Problems:   Air in deep soft tissue of abdomen (HCC)   Pneumoperitoneum   1. Perforated viscus status post ex lap etc. Etc. a. Defer to general surgery diet, nutrition, mobility etc. b. 16 French NG placed 8/1. c. Continue ceftriaxone Flagyl 2. Postoperative hypoxia likely atelectasis a. Agree with I-S as much as possible b. If persistent hypoxia consider chest x-ray in a.m. 3. DM TY 2 on oral medications a. Metformin 500 XR on hold-sliding scale as tolerated every 4 b. Gabapentin 200 at bedtime held until can reliably take p.o. c. CBGs ranging 1 47-1 88 on on CBG 4. HTN a. Monitor trends-metoprolol every 6 as needed for blood pressure above 185 5. Hypokalemia on admission a. Resolved however magnesium is low therefore getting replacement b. Would probably change to D5 half-normal saline without potassium in a.m. 6. HLD 7. BMI 31 8. Recent right knee arthroplasty 08/08/2019  DVT prophylaxis: Lovenox as per general surgery Code Status: Full Family Communication: None Disposition: Per surgery  Status is: Inpatient  Remains inpatient appropriate because:Ongoing active pain requiring inpatient pain management and IV treatments appropriate due to intensity of illness or  inability to take PO   Dispo: The patient is from: Home              Anticipated d/c is to: Unclear              Anticipated d/c date is: > 3 days              Patient currently is not medically stable to d/c.       Consultants:   We are consultants  Procedures: None  Antimicrobials: As above   Subjective: Relatively comfortable Getting therapy to right leg Pain is manageable Does not seem to require oxygen when it is taken off of her Has not passed flatus  Objective: Vitals:   08/12/19 1316 08/12/19 1958 08/12/19 2350 08/13/19 0555  BP: (!) 114/63 (!) 125/55 (!) 113/52 (!) 124/52  Pulse: 101 (!) 110 (!) 110 (!) 106  Resp: 16 14 14 15   Temp: 98.1 F (36.7 C) 97.8 F (36.6 C) 98.3 F (36.8 C) 98.4 F (36.9 C)  TempSrc: Oral Oral Oral Oral  SpO2: 95% 98% 98% 97%  Weight:      Height:        Intake/Output Summary (Last 24 hours) at 08/13/2019 7858 Last data filed at 08/13/2019 0700 Gross per 24 hour  Intake 2950 ml  Output 1895 ml  Net 1055 ml   Filed Weights   08/12/19 0225  Weight: 74.8 kg    Examination:  General exam: White female looking about stated age no JVD mucosa dry Respiratory system: Clear without added sound Cardiovascular system: S1-S2 low-grade tachycardia low high 90s Gastrointestinal system: Dressing in place not stained.  Central nervous system: Neurologically somewhat intact intact Extremities: Moving left leg independently, right leg in device Skin: No lower extremity edema Psychiatry: Pleasant euthymic  Data Reviewed: I have personally reviewed following labs and imaging studies   Radiology Studies: CT Angio Chest PE W and/or Wo Contrast  Result Date: 08/12/2019 CLINICAL DATA:  Confusion.  Abdominal pain.  Recent knee surgery. EXAM: CT ANGIOGRAPHY CHEST WITH CONTRAST TECHNIQUE: Multidetector CT imaging of the chest was performed using the standard protocol during bolus administration of intravenous contrast. Multiplanar CT image  reconstructions and MIPs were obtained to evaluate the vascular anatomy. CONTRAST:  35mL OMNIPAQUE IOHEXOL 350 MG/ML SOLN COMPARISON:  None. FINDINGS: Cardiovascular: There is satisfactory opacification of the pulmonary arteries to the segmental level. There is no evidence of a pulmonary embolism. Heart is mildly enlarged. No pericardial effusion mild 2 vessel coronary artery calcifications. Aorta is normal in caliber. No dissection. Mild aortic atherosclerosis. Mediastinum/Nodes: No neck base, axillary, mediastinal or hilar masses or enlarged lymph nodes. Trachea and esophagus are unremarkable. Lungs/Pleura: Study acquired in expiration. Linear atelectasis noted in the lung bases. No evidence pneumonia or pulmonary edema. No pleural effusion or pneumothorax. Upper Abdomen: Small bubbles of free intraperitoneal air are seen along the anterior abdomen. The etiology of these is unclear. Small sliding hiatal hernia. Musculoskeletal: No fracture or acute finding. No osteoblastic or osteolytic lesions. Review of the MIP images confirms the above findings. IMPRESSION: 1. No evidence of a pulmonary embolism. 2. Small bubbles of free intraperitoneal air noted in the limited visualization of the upper abdomen. The etiology of this unclear. Recommend follow-up abdomen and pelvis CT scan for further assessment. 3. Mild cardiomegaly.  Mild aortic atherosclerosis. 4. Mild lung atelectasis.  No acute findings in the lungs. Aortic Atherosclerosis (ICD10-I70.0). Electronically Signed   By: Lajean Manes M.D.   On: 08/12/2019 05:01   DG Chest Port 1 View  Result Date: 08/12/2019 CLINICAL DATA:  Possible sepsis EXAM: PORTABLE CHEST 1 VIEW COMPARISON:  12/08/2018 FINDINGS: Cardiac shadow is stable. Mild aortic calcifications are seen. The lungs are well aerated bilaterally. No focal infiltrate or sizable effusion is seen. No bony abnormality is noted. Degenerative changes of the thoracic spine are noted. IMPRESSION: No acute  abnormality seen. Electronically Signed   By: Inez Catalina M.D.   On: 08/12/2019 02:32   DG Knee Complete 4 Views Right  Result Date: 08/12/2019 CLINICAL DATA:  Status post right knee replacement EXAM: RIGHT KNEE - COMPLETE 4+ VIEW COMPARISON:  None. FINDINGS: Right knee prosthesis is noted in satisfactory position. No acute fracture or dislocation is noted. No soft tissue changes are seen. IMPRESSION: No acute abnormality noted. Electronically Signed   By: Inez Catalina M.D.   On: 08/12/2019 02:32   DG Abd Portable 1V  Result Date: 08/12/2019 CLINICAL DATA:  NG tube placement EXAM: PORTABLE ABDOMEN - 1 VIEW COMPARISON:  None. FINDINGS: 1237 hours. NG tube tip is in the distal stomach. Nonspecific bowel gas pattern. Surgical drains overlie the left abdomen. IMPRESSION: NG tube tip is in the distal stomach near the pylorus. Electronically Signed   By: Misty Stanley M.D.   On: 08/12/2019 13:01   CT Renal Stone Study  Result Date: 08/12/2019 CLINICAL DATA:  Follow-up from a CT angiogram of the chest that showed a small amount of free intraperitoneal air. Patient with abdominal pain. Recent knee surgery. EXAM: CT ABDOMEN AND PELVIS WITHOUT CONTRAST TECHNIQUE: Multidetector CT imaging of the abdomen and pelvis was performed following the standard protocol  without IV contrast. COMPARISON:  Current CTA chest. FINDINGS: Lower chest: Mild linear atelectasis.  No acute findings. Hepatobiliary: Liver normal in size. No mass or focal lesion. Normal gallbladder. No bile duct dilation. Pancreas: Unremarkable. No pancreatic ductal dilatation or surrounding inflammatory changes. Spleen: Normal in size without focal abnormality. Adrenals/Urinary Tract: No adrenal masses. Mild bilateral renal cortical thinning. Symmetric bilateral renal enhancement and excretion. Small, 7 mm, low-density mass, lower pole the left kidney, consistent with a cyst. 5 mm low-density lesion, upper pole the right kidney, also likely a cyst. No other  masses, no stones and no hydronephrosis. Normal ureters. Bladder is unremarkable. Stomach/Bowel: There is free intraperitoneal air. In the left lower quadrant, there is apparent collection with nondependent air, which measures approximately 9 x 3.8 x 6.9 cm. This is potentially a focal area of dilated small bowel, but cannot be confidently connected to the small bowel. It is felt more likely to be an extraluminal collection/abscess. Small bubbles of free intraperitoneal air lies along the anterior superior margin of this with more significant free air collecting along the anterior aspect of the mid to upper abdomen. There is a smaller apparent collection that lies just superior to the bladder. The left colon is mostly decompressed. There are multiple diverticula most evident along the sigmoid. No convincing diverticulitis. No colonic wall thickening or other inflammatory process. Normal stomach. Prominent loops of proximal small bowel noted in the left mid to upper abdomen, 3.1 cm in diameter mild wall thickening. No other small bowel dilation or wall thickening. Normal appendix visualized. Vascular/Lymphatic: Aortic atherosclerotic calcifications. No aneurysm. No enlarged lymph nodes. Reproductive: Uterus and bilateral adnexa are unremarkable. Other: No ascites. Musculoskeletal: No fracture or acute finding. No osteoblastic or osteolytic lesions. IMPRESSION: 1. Free intraperitoneal air consistent with a ruptured viscus. The exact site/cause of the perforation is not definitive. There is an apparent collection in the anterior left lower quadrant, 9 cm in greatest dimension. Small additional collection suggested just superior to the bladder. A loop of small bowel in the mid to upper abdomen shows wall thickening and mild distension consistent with infectious or inflammatory enteritis. There are multiple sigmoid colon diverticula, but no evidence of diverticulitis. 2. No other acute abnormality within the abdomen or  pelvis. 3. Aortic atherosclerosis. Electronically Signed   By: Lajean Manes M.D.   On: 08/12/2019 06:44     Scheduled Meds:  acetaminophen  1,000 mg Per Tube Q6H   docusate  100 mg Per Tube BID   enoxaparin (LOVENOX) injection  40 mg Subcutaneous Q24H   insulin aspart  0-9 Units Subcutaneous Q6H   methocarbamol  1,000 mg Per Tube Q8H   Continuous Infusions:  cefTRIAXone (ROCEPHIN)  IV 2 g (08/12/19 1801)   And   metronidazole 500 mg (08/13/19 0349)     LOS: 1 day    Time spent: [place], MD [facility] To contact the attending provider between 7A-7P or the covering provider during after hours 7P-7A, please log into the web site [email or web address] and access using universal [place] password for that web site. If you do not have the password, please call the hospital operator.  08/13/2019, 7:28 AM

## 2019-08-13 NOTE — Plan of Care (Signed)
  Problem: Coping: Goal: Level of anxiety will decrease Outcome: Progressing   Problem: Pain Managment: Goal: General experience of comfort will improve Outcome: Progressing   Problem: Safety: Goal: Ability to remain free from injury will improve Outcome: Progressing   Problem: Skin Integrity: Goal: Risk for impaired skin integrity will decrease Outcome: Progressing   

## 2019-08-13 NOTE — TOC Initial Note (Signed)
Transition of Care [facility]) - Initial/Assessment Note    Patient Details  Name: Shannon Moore MRN: [id number] Date of Birth: Aug 23, 1945  Transition of Care [facility]) CM/SW Contact:    Sharin Mons, RN Phone Number: 08/13/2019, 12:25 PM  Clinical Narrative:     Presents with pneumoperitoneum of unknown etiology.        - S/P exploratory laparotomy, LOA, Kocherization of duodenum, takedown of ascending colon, splenic flexure, and descending colon, abdominal washout, placement of JP drains x2, 8/1.  NCM spoke with pt @ bedside in regard to d/c planning. PTA states from home with husband had had recent TKA  and was going to [facility] outpatient rehab. for PT services. States was independent with ADL's.    TOC team following for needs.....  Expected Discharge Plan: [place] (vs outpatient services) Barriers to Discharge: Continued Medical Work up   Patient Goals and CMS Choice        Expected Discharge Plan and Services Expected Discharge Plan: [place] (vs outpatient services)                                              Prior Living Arrangements/Services                       Activities of Daily Living Home Assistive Devices/Equipment: CBG Meter, Blood pressure cuff, Cane (specify quad or straight), Walker (specify type), Grab bars in shower ADL Screening (condition at time of admission) Patient's cognitive ability adequate to safely complete daily activities?: Yes Is the patient deaf or have difficulty hearing?: No Does the patient have difficulty seeing, even when wearing glasses/contacts?: No Does the patient have difficulty concentrating, remembering, or making decisions?: No Patient able to express need for assistance with ADLs?: Yes Does the patient have difficulty dressing or bathing?: No Independently performs ADLs?: Yes (appropriate for developmental age) Does the patient have difficulty walking or climbing  stairs?: No Weakness of Legs: Right Weakness of Arms/Hands: None  Permission Sought/Granted                  Emotional Assessment              Admission diagnosis:  Pneumoperitoneum [K66.8] Hypokalemia [E87.6] Hypoxia [R09.02] Elevated troponin [R77.8] SIRS (systemic inflammatory response syndrome) (HCC) [R65.10] Acute pain of right knee [M25.561] Urinary tract infection without hematuria, site unspecified [N39.0] Altered mental status, unspecified altered mental status type [R41.82] Air in deep soft tissue of abdomen ([place]) [T79.7XXA] Patient Active Problem List   Diagnosis Date Noted  . Air in deep soft tissue of abdomen ([place]) 08/12/2019  . Pneumoperitoneum 08/12/2019  . Right knee OA 08/07/2019  . 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 Abnormal EKG 01/08/2014  . Chest pain 01/08/2014  . Dyspnea 01/08/2014  . Palpitations 01/08/2014  . Essential hypertension 01/08/2014  . Type 2 diabetes mellitus without complication ([place]) 07/11/1599  . Obesity (BMI 30-39.9) 01/08/2014  . Hyperlipidemia 01/08/2014   PCP:  Harlan Stains, MD Pharmacy:   [id number] - [place], [place] [address] Phone: [phone] Fax: [phone]     Social Determinants of Health (SDOH) Interventions    Readmission Risk Interventions No flowsheet data found.

## 2019-08-13 NOTE — Progress Notes (Signed)
Orthopedic Tech Progress Note Patient Details:  Shannon Moore [birth year] [id number]  CPM Right Knee CPM Right Knee: On Right Knee Flexion (Degrees): 40 Right Knee Extension (Degrees): 0 Additional Comments: pt was not comfortable going higher than 40.  Post Interventions Patient Tolerated: Well Instructions Provided: Care of device, Adjustment of device  Karolee Stamps 08/13/2019, 5:44 AM

## 2019-08-14 LAB — COMPREHENSIVE METABOLIC PANEL
ALT: 22 U/L (ref 0–44)
AST: 26 U/L (ref 15–41)
Albumin: 1.8 g/dL — ABNORMAL LOW (ref 3.5–5.0)
Alkaline Phosphatase: 59 U/L (ref 38–126)
Anion gap: 9 (ref 5–15)
BUN: 25 mg/dL — ABNORMAL HIGH (ref 8–23)
CO2: 27 mmol/L (ref 22–32)
Calcium: 7.8 mg/dL — ABNORMAL LOW (ref 8.9–10.3)
Chloride: 100 mmol/L (ref 98–111)
Creatinine, Ser: 0.88 mg/dL (ref 0.44–1.00)
GFR calc Af Amer: >60 mL/min (ref >60)
GFR calc non Af Amer: >60 mL/min (ref >60)
Glucose, Bld: 174 mg/dL — ABNORMAL HIGH (ref 70–99)
Potassium: 3.6 mmol/L (ref 3.5–5.1)
Sodium: 136 mmol/L (ref 135–145)
Total Bilirubin: 0.6 mg/dL (ref 0.3–1.2)
Total Protein: 5.1 g/dL — ABNORMAL LOW (ref 6.5–8.1)

## 2019-08-14 LAB — CBC WITH DIFFERENTIAL/PLATELET
Abs Immature Granulocytes: 0.55 10*3/uL — ABNORMAL HIGH (ref 0.00–0.07)
Basophils Absolute: 0 10*3/uL (ref 0.0–0.1)
Basophils Relative: 0 %
Eosinophils Absolute: 0.5 10*3/uL (ref 0.0–0.5)
Eosinophils Relative: 3 %
HCT: 27.3 % — ABNORMAL LOW (ref 36.0–46.0)
Hemoglobin: 9.1 g/dL — ABNORMAL LOW (ref 12.0–15.0)
Immature Granulocytes: 3 %
Lymphocytes Relative: 9 %
Lymphs Abs: 1.6 10*3/uL (ref 0.7–4.0)
MCH: 30 pg (ref 26.0–34.0)
MCHC: 33.3 g/dL (ref 30.0–36.0)
MCV: 90.1 fL (ref 80.0–100.0)
Monocytes Absolute: 0.8 10*3/uL (ref 0.1–1.0)
Monocytes Relative: 5 %
Neutro Abs: 14.2 10*3/uL — ABNORMAL HIGH (ref 1.7–7.7)
Neutrophils Relative %: 80 %
Platelets: 346 10*3/uL (ref 150–400)
RBC: 3.03 MIL/uL — ABNORMAL LOW (ref 3.87–5.11)
RDW: 13.6 % (ref 11.5–15.5)
WBC: 17.7 10*3/uL — ABNORMAL HIGH (ref 4.0–10.5)
nRBC: 0 % (ref 0.0–0.2)

## 2019-08-14 LAB — MAGNESIUM: Magnesium: 2.2 mg/dL (ref 1.7–2.4)

## 2019-08-14 LAB — GLUCOSE, CAPILLARY
Glucose-Capillary: 129 mg/dL — ABNORMAL HIGH (ref 70–99)
Glucose-Capillary: 134 mg/dL — ABNORMAL HIGH (ref 70–99)
Glucose-Capillary: 152 mg/dL — ABNORMAL HIGH (ref 70–99)
Glucose-Capillary: 158 mg/dL — ABNORMAL HIGH (ref 70–99)
Glucose-Capillary: 158 mg/dL — ABNORMAL HIGH (ref 70–99)

## 2019-08-14 MED ORDER — HYDRALAZINE HCL 20 MG/ML IJ SOLN: 5.0000 mg | INTRAMUSCULAR | Status: DC | PRN

## 2019-08-14 MED ORDER — ACETAMINOPHEN 10 MG/ML IV SOLN
1000.0000 mg | Freq: Four times a day (QID) | INTRAVENOUS | Status: AC
  Administered 2019-08-14 – 2019-08-15 (×4): 1000 mg via INTRAVENOUS
  Filled 2019-08-14 (×4): qty 100

## 2019-08-14 NOTE — Plan of Care (Signed)

## 2019-08-14 NOTE — Progress Notes (Signed)
Orthopedic Tech Progress Note Patient Details:  Shannon Moore 25-Feb-1945 [id number] Applied patient to CPM this evening around 1630 Patient ID: Shannon Moore, female   DOB: 1945-05-21, 74 y.o.   MRN: [id number]   Shannon Moore 08/14/2019, 5:51 PM

## 2019-08-14 NOTE — Progress Notes (Addendum)
Consult signoff note -->Thank you for this consult   Shannon Moore  CWC:[id number] DOB: [birth year] DOA: 08/12/2019 PCP: Harlan Stains, MD  Brief Narrative:  74 year old white female Prior left knee arthroplasty 2012, HTN DM TY 2 HLD BMI 31 bladder excision BTL in the past, normal Bundle branch right TKR 08/08/2019 Dr.Olin Return to ED 8/1-complains of knee pain but increasing abdominal pain-also was confused reported fever eventually found on CT abdomen pelvis to have perforated viscus General surgery is attending physician we will manage medical issues  Assessment & Plan:   Active Problems:   Air in deep soft tissue of abdomen (HCC)   Pneumoperitoneum   1. Perforated viscus status post ex lap etc. Etc. a. Defer to general surgery diet, nutrition, mobility etc. b. 16 French NG placed 8/1. c. Continue ceftriaxone Flagyl 2. Postoperative hypoxia likely atelectasis a. Agree with I-S as much as possible b. Took her off oxygen and sats remained above 90, her goal is above 88% c. I have explained to her and nursing staff to ensure that she is seated straight up in the bed at 45 degrees as much she can tolerate d. Pulmonary hygiene as possible 3. DM TY 2 on oral medications a. Metformin 500 XR on hold-sliding scale as tolerated every 4 b. Gabapentin 200 at bedtime held until can reliably take p.o. c. CBGs ranging 1 47-1 88 on on CBG 4. HTN a. Monitor trends-metoprolol every 6 as needed for blood pressure above 185 b. As needed's in place hydralazine above 140 metoprolol for above 180 IV medications 5. Hypokalemia on admission a. Magnesium is resolved b. Continue fluids as per surgeons 6. HLD 7. BMI 31 8. Recent right knee arthroplasty 08/08/2019  DVT prophylaxis: Lovenox as per general surgery Code Status: Full Family Communication: None Disposition: Per surgery  Status is: Inpatient  Remains inpatient appropriate because:Ongoing active pain requiring inpatient pain  management and IV treatments appropriate due to intensity of illness or inability to take PO   Dispo: The patient is from: Home              Anticipated d/c is to: Unclear              Anticipated d/c date is: > 3 days              Patient currently is not medically stable to d/c.       Consultants:   We are consultants  Procedures: None  Antimicrobials: As above   Subjective: Relatively comfortable Getting therapy to right leg Pain is manageable Does not seem to require oxygen when it is taken off of her Has not passed flatus  Objective: Vitals:   08/13/19 1458 08/13/19 1934 08/14/19 0254 08/14/19 0821  BP: (!) 133/58 (!) 124/59 (!) 148/75 (!) 163/65  Pulse: 94 95 91 81  Resp: 18 16 17 17   Temp: 98.4 F (36.9 C) 98.4 F (36.9 C) 98.6 F (37 C) 97.6 F (36.4 C)  TempSrc: Oral Oral Oral Oral  SpO2: 95% 94% 97% 96%  Weight:      Height:        Intake/Output Summary (Last 24 hours) at 08/14/2019 1055 Last data filed at 08/14/2019 0748 Gross per 24 hour  Intake 1814.52 ml  Output 1645 ml  Net 169.52 ml   Filed Weights   08/12/19 0225  Weight: 74.8 kg    Examination:  General exam: White female looking about stated age no JVD mucosa dry Respiratory system: Clear without added  sound Cardiovascular system: S1-S2  sinus rhythm Gastrointestinal system: Dressing in place not stained. Central nervous system: Neurologically somewhat intact intact Extremities: Moving left leg independently, right leg in device Skin: No lower extremity edema Psychiatry: Pleasant euthymic  Data Reviewed: I have personally reviewed following labs and imaging studies reviewed briefly-does not require any further magnesium placement  Radiology Studies: DG Abd Portable 1V  Result Date: 08/12/2019 CLINICAL DATA:  NG tube placement EXAM: PORTABLE ABDOMEN - 1 VIEW COMPARISON:  None. FINDINGS: 1237 hours. NG tube tip is in the distal stomach. Nonspecific bowel gas pattern. Surgical  drains overlie the left abdomen. IMPRESSION: NG tube tip is in the distal stomach near the pylorus. Electronically Signed   By: Misty Stanley M.D.   On: 08/12/2019 13:01     Scheduled Meds: . Chlorhexidine Gluconate Cloth  6 each Topical Daily  . docusate  100 mg Per Tube BID  . enoxaparin (LOVENOX) injection  40 mg Subcutaneous Q24H  . insulin aspart  0-9 Units Subcutaneous Q6H  . lidocaine  1 patch Transdermal Q24H   Continuous Infusions: . acetaminophen    . cefTRIAXone (ROCEPHIN)  IV Stopped (08/13/19 1708)   And  . metronidazole 500 mg (08/14/19 0826)  . dextrose 5 % and 0.45 % NaCl with KCl 20 mEq/L Stopped (08/13/19 1754)  . methocarbamol (ROBAXIN) IV 500 mg (08/14/19 0609)     LOS: 2 days    Time spent: [place], MD [facility] To contact the attending provider between 7A-7P or the covering provider during after hours 7P-7A, please log into the web site [email or web address] and access using universal [place] password for that web site. If you do not have the password, please call the hospital operator.  08/14/2019, 10:55 AM

## 2019-08-14 NOTE — Progress Notes (Signed)
Physical Therapy Treatment Patient Details Name: Shannon Moore MRN: [id number] DOB: 1945-05-26 Today's Date: 08/14/2019    History of Present Illness Patient is 74 y.o. female admitted on 08/12/19 with abdominal pain. Underwent ex lap of colon, mobilization of duodenum, abdominal wash out with placement of drains and lysis of adhesions same day. PMH: Rt TKA on 08/07/19 DM, HTN, HLD, CHF, OA, GERD, gout, obesity, Lt TKA in 2012.    PT Comments    Pt supine in bed on arrival.  Pt very motivated to move but fearful of pain.  Pt required increased assistance to move to edge of bed.  Once in sitting mod assistance to rise to standing and min assistance for short bout of gt training.  Braced abdominal incision will pillow.  Pt resting in recliner post session.  Continue to recommend HHPT.    Follow Up Recommendations  Home health PT (vs. OPPT( if she progresses well enough))     Equipment Recommendations  None recommended by PT    Recommendations for Other Services       Precautions / Restrictions Precautions Precautions: Fall Precaution Comments: NGT, 2 drains, IV, foley, O2, pulse ox Restrictions Weight Bearing Restrictions: Yes RLE Weight Bearing: Weight bearing as tolerated Other Position/Activity Restrictions: WBAT    Mobility  Bed Mobility Overal bed mobility: Needs Assistance Bed Mobility: Rolling;Sidelying to Sit Rolling: Mod assist Sidelying to sit: Max assist       General bed mobility comments: cues for log roll technique, use of rail, assist with bed pad to roll, for LEs over EOB and to raise trunk, belted pillow to abdomen in supine to ease pain.  Transfers Overall transfer level: Needs assistance Equipment used: Rolling walker (2 wheeled) Transfers: Sit to/from Stand Sit to Stand: Mod assist         General transfer comment: Mod assistance to power up into standing.  Ambulation/Gait Ambulation/Gait assistance: Min assist Gait Distance (Feet): 10  Feet Assistive device: Rolling walker (2 wheeled) Gait Pattern/deviations: Decreased stride length;Step-to pattern;Trunk flexed;Antalgic     General Gait Details: performed small trial in room with short antalgic steps. Cues for upper trunk control.   Stairs             Wheelchair Mobility    Modified Rankin (Stroke Patients Only)       Balance Overall balance assessment: Needs assistance Sitting-balance support: Feet supported Sitting balance-Leahy Scale: Fair       Standing balance-Leahy Scale: Poor                              Cognition Arousal/Alertness: Awake/alert Behavior During Therapy: WFL for tasks assessed/performed Overall Cognitive Status: Within Functional Limits for tasks assessed                                        Exercises General Exercises - Lower Extremity Ankle Circles/Pumps: AROM;Both;20 reps;Supine Quad Sets: AROM;Both;10 reps;Supine Heel Slides: AROM;AAROM;Both;10 reps;Supine Hip ABduction/ADduction: AROM;AAROM;Both;10 reps;Supine Straight Leg Raises: AROM;AAROM;Both;10 reps;Supine    General Comments        Pertinent Vitals/Pain Pain Assessment: 0-10 Pain Score: 8  Pain Location: abdomen Pain Descriptors / Indicators: Moaning;Grimacing;Guarding Pain Intervention(s): Monitored during session;Repositioned    Home Living                      Prior Function  PT Goals (current goals can now be found in the care plan section) Acute Rehab PT Goals Patient Stated Goal: return home Potential to Achieve Goals: Good Progress towards PT goals: Progressing toward goals    Frequency    Min 5X/week      PT Plan Current plan remains appropriate    Co-evaluation              AM-PAC PT "6 Clicks" Mobility   Outcome Measure  Help needed turning from your back to your side while in a flat bed without using bedrails?: A Lot Help needed moving from lying on your back to  sitting on the side of a flat bed without using bedrails?: Total Help needed moving to and from a bed to a chair (including a wheelchair)?: A Lot Help needed standing up from a chair using your arms (e.g., wheelchair or bedside chair)?: A Lot Help needed to walk in hospital room?: A Little Help needed climbing 3-5 steps with a railing? : A Lot 6 Click Score: 12    End of Session Equipment Utilized During Treatment: Gait belt Activity Tolerance: Patient tolerated treatment well Patient left: with call bell/phone within reach;in chair;with family/visitor present Nurse Communication: Mobility status PT Visit Diagnosis: Pain;Other abnormalities of gait and mobility (R26.89) Pain - Right/Left: Right Pain - part of body: Knee     Time: 8032-1224 PT Time Calculation (min) (ACUTE ONLY): 39 min  Charges:  $Gait Training: 8-22 mins $Therapeutic Exercise: 8-22 mins $Therapeutic Activity: 8-22 mins                     Erasmo Leventhal , PTA Acute Rehabilitation Services Pager [phone] Office [phone]     Demira Gwynne [name] 08/14/2019, 3:57 PM

## 2019-08-14 NOTE — Progress Notes (Signed)
Patient ID: Shannon Moore, female   DOB: June 19, 1945, 74 y.o.   MRN: [id number]   Acute Care Surgery Service Progress Note:    Chief Complaint/Subjective: Feels like she overdid it yesterday Had LLQ pain overnight No flatus Working with PT and knee in cpm  Objective: Vital signs in last 24 hours: Temp:  [97.6 F (36.4 C)-98.6 F (37 C)] 97.6 F (36.4 C) (08/03 0821) Pulse Rate:  [81-101] 81 (08/03 0821) Resp:  [16-18] 17 (08/03 0821) BP: (112-163)/(58-75) 163/65 (08/03 0821) SpO2:  [94 %-97 %] 96 % (08/03 0821) Last BM Date: 08/06/19  Intake/Output from previous day: 08/02 0701 - 08/03 0700 In: 1814.5 [I.V.:224.3; IV Piggyback:1590.3] Out: 1660 [Urine:580; Emesis/NG output:1000; Drains:80] Intake/Output this shift: Total I/O In: -  Out: 45 [Drains:45]  Lungs: cta, nonlabored; IS about 300-500  Cardiovascular: reg  Abd: soft, obese, min TTP, dressing/wound c/d/i; drain - serosang NG - bilious  Extremities: no edema, +SCDs; knee in cpm  Neuro: alert, nonfocal  Lab Results: CBC  Recent Labs    08/13/19 0812 08/14/19 0234  WBC 15.5* 17.7*  HGB 10.2* 9.1*  HCT 31.1* 27.3*  PLT 370 346   BMET Recent Labs    08/13/19 0812 08/14/19 0234  NA 138 136  K 4.4 3.6  CL 99 100  CO2 28 27  GLUCOSE 169* 174*  BUN 23 25*  CREATININE 1.30* 0.88  CALCIUM 8.3* 7.8*   LFT Hepatic Function Latest Ref Rng & Units 08/14/2019 08/13/2019 08/12/2019  Total Protein 6.5 - 8.1 g/dL 5.1(L) 5.2(L) 6.2(L)  Albumin 3.5 - 5.0 g/dL 1.8(L) 2.0(L) 2.4(L)  AST 15 - 41 U/L 26 30 12(L)  ALT 0 - 44 U/L '22 26 10  '$ Alk Phosphatase 38 - 126 U/L 59 56 84  Total Bilirubin 0.3 - 1.2 mg/dL 0.6 0.9 1.8(H)   PT/INR Recent Labs    08/12/19 0202  LABPROT 14.1  INR 1.1   ABG No results for input(s): PHART, HCO3 in the last 72 hours.  Invalid input(s): PCO2, PO2  Studies/Results:  Anti-infectives: Anti-infectives (From admission, onward)   Start     Dose/Rate Route Frequency Ordered Stop     08/12/19 2200  vancomycin (VANCOCIN) IVPB 1000 mg/200 mL premix  Status:  Discontinued        1,000 mg 200 mL/hr over 60 Minutes Intravenous Every 24 hours 08/12/19 0553 08/12/19 1722   08/12/19 1730  cefTRIAXone (ROCEPHIN) 2 g in sodium chloride 0.9 % 100 mL IVPB     Discontinue    "And" Linked Group Details   2 g 200 mL/hr over 30 Minutes Intravenous Every 24 hours 08/12/19 1728 08/16/19 1729   08/12/19 1730  metroNIDAZOLE (FLAGYL) IVPB 500 mg     Discontinue    "And" Linked Group Details   500 mg 100 mL/hr over 60 Minutes Intravenous Every 8 hours 08/12/19 1728 08/16/19 1729   08/12/19 1000  ceFEPIme (MAXIPIME) 2 g in sodium chloride 0.9 % 100 mL IVPB  Status:  Discontinued        2 g 200 mL/hr over 30 Minutes Intravenous Every 12 hours 08/12/19 0553 08/12/19 1722   08/12/19 0145  ceFEPIme (MAXIPIME) 2 g in sodium chloride 0.9 % 100 mL IVPB        2 g 200 mL/hr over 30 Minutes Intravenous  Once 08/12/19 0139 08/12/19 0405   08/12/19 0130  aztreonam (AZACTAM) 2 g in sodium chloride 0.9 % 100 mL IVPB  Status:  Discontinued  2 g 200 mL/hr over 30 Minutes Intravenous  Once 08/12/19 0125 08/12/19 0139   08/12/19 0130  metroNIDAZOLE (FLAGYL) IVPB 500 mg        500 mg 100 mL/hr over 60 Minutes Intravenous  Once 08/12/19 0125 08/12/19 0507   08/12/19 0130  vancomycin (VANCOCIN) IVPB 1000 mg/200 mL premix        1,000 mg 200 mL/hr over 60 Minutes Intravenous  Once 08/12/19 0125 08/12/19 0709      Medications: Scheduled Meds: . Chlorhexidine Gluconate Cloth  6 each Topical Daily  . docusate  100 mg Per Tube BID  . enoxaparin (LOVENOX) injection  40 mg Subcutaneous Q24H  . insulin aspart  0-9 Units Subcutaneous Q6H  . lidocaine  1 patch Transdermal Q24H   Continuous Infusions: . cefTRIAXone (ROCEPHIN)  IV Stopped (08/13/19 1708)   And  . metronidazole 500 mg (08/14/19 0826)  . dextrose 5 % and 0.45 % NaCl with KCl 20 mEq/L Stopped (08/13/19 1754)  . methocarbamol (ROBAXIN)  IV 500 mg (08/14/19 0609)   PRN Meds:.metoprolol tartrate, ondansetron **OR** ondansetron (ZOFRAN) IV, oxyCODONE  Assessment/Plan: Patient Active Problem List   Diagnosis Date Noted  . Air in deep soft tissue of abdomen ([place]) 08/12/2019  . Pneumoperitoneum 08/12/2019  . Right knee OA 08/07/2019  . 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 Abnormal EKG 01/08/2014  . Chest pain 01/08/2014  . Dyspnea 01/08/2014  . Palpitations 01/08/2014  . Essential hypertension 01/08/2014  . Type 2 diabetes mellitus without complication ([place]) 53/66/4403  . Obesity (BMI 30-39.9) 01/08/2014  . Hyperlipidemia 01/08/2014   s/p Procedure(s): EXPLORATORY LAPAROTOMY WITH MOBILIZATION OF ASCENDING AND DESCENDING COLON, MOBILIZATION OF DUODENUM, ABDOMINAL [place] OUT AND DRAIN PLACEMENT. 08/12/2019  Pneumoperitoneum, unknown etiology, suspect perforated diverticulitis  S/P exploratory laparotomy, lysis of adhesions, Kocherization of duodenum, takedown of ascending colon, splenic flexure, and descending colon, abdominal washout, placement of JP drains x2 - POD#2 afebrile,  WBC 15-->17 - monitor - NG- 1000 cc/24h, continue LIWS, await ROBF - adjust pain meds: cont scheduled IV APAP and robaxin, cont lidoderm patch, PRN morphine  - continue IV abx - CBC and BMP in AM  - IS 10x q1h!   FEN: NPO, cont IVF, lytes ok Renal: Cr back to normal ID: Rocephin/Flagyl 8/1 >> VTE: SCD's, Lovenox Foley: d/c foley Follow up: Dr. Bobbye Morton  Dispo: floor, PT eval, appreciate medicine following for medical management Disposition:  LOS: 2 days    Leighton Ruff. Redmond Pulling, MD, FACS General, Bariatric, & Minimally Invasive Surgery [phone] [facility]

## 2019-08-15 ENCOUNTER — Inpatient Hospital Stay (HOSPITAL_COMMUNITY): Payer: Medicare HMO

## 2019-08-15 LAB — CBC WITH DIFFERENTIAL/PLATELET
Abs Immature Granulocytes: 1.3 10*3/uL — ABNORMAL HIGH (ref 0.00–0.07)
Basophils Absolute: 0.1 10*3/uL (ref 0.0–0.1)
Basophils Relative: 1 %
Eosinophils Absolute: 0.7 10*3/uL — ABNORMAL HIGH (ref 0.0–0.5)
Eosinophils Relative: 3 %
HCT: 29.8 % — ABNORMAL LOW (ref 36.0–46.0)
Hemoglobin: 9.8 g/dL — ABNORMAL LOW (ref 12.0–15.0)
Immature Granulocytes: 6 %
Lymphocytes Relative: 13 %
Lymphs Abs: 2.7 10*3/uL (ref 0.7–4.0)
MCH: 29.8 pg (ref 26.0–34.0)
MCHC: 32.9 g/dL (ref 30.0–36.0)
MCV: 90.6 fL (ref 80.0–100.0)
Monocytes Absolute: 0.5 10*3/uL (ref 0.1–1.0)
Monocytes Relative: 3 %
Neutro Abs: 14.9 10*3/uL — ABNORMAL HIGH (ref 1.7–7.7)
Neutrophils Relative %: 74 %
Platelets: 417 10*3/uL — ABNORMAL HIGH (ref 150–400)
RBC: 3.29 MIL/uL — ABNORMAL LOW (ref 3.87–5.11)
RDW: 13.9 % (ref 11.5–15.5)
WBC: 20.2 10*3/uL — ABNORMAL HIGH (ref 4.0–10.5)
nRBC: 0.1 % (ref 0.0–0.2)

## 2019-08-15 LAB — COMPREHENSIVE METABOLIC PANEL
ALT: 20 U/L (ref 0–44)
AST: 21 U/L (ref 15–41)
Albumin: 1.9 g/dL — ABNORMAL LOW (ref 3.5–5.0)
Alkaline Phosphatase: 77 U/L (ref 38–126)
Anion gap: 9 (ref 5–15)
BUN: 16 mg/dL (ref 8–23)
CO2: 30 mmol/L (ref 22–32)
Calcium: 7.9 mg/dL — ABNORMAL LOW (ref 8.9–10.3)
Chloride: 99 mmol/L (ref 98–111)
Creatinine, Ser: 0.68 mg/dL (ref 0.44–1.00)
GFR calc Af Amer: >60 mL/min (ref >60)
GFR calc non Af Amer: >60 mL/min (ref >60)
Glucose, Bld: 163 mg/dL — ABNORMAL HIGH (ref 70–99)
Potassium: 3.8 mmol/L (ref 3.5–5.1)
Sodium: 138 mmol/L (ref 135–145)
Total Bilirubin: 0.7 mg/dL (ref 0.3–1.2)
Total Protein: 5.2 g/dL — ABNORMAL LOW (ref 6.5–8.1)

## 2019-08-15 LAB — GLUCOSE, CAPILLARY
Glucose-Capillary: 132 mg/dL — ABNORMAL HIGH (ref 70–99)
Glucose-Capillary: 153 mg/dL — ABNORMAL HIGH (ref 70–99)
Glucose-Capillary: 154 mg/dL — ABNORMAL HIGH (ref 70–99)
Glucose-Capillary: 157 mg/dL — ABNORMAL HIGH (ref 70–99)
Glucose-Capillary: 169 mg/dL — ABNORMAL HIGH (ref 70–99)

## 2019-08-15 NOTE — Plan of Care (Signed)

## 2019-08-15 NOTE — Progress Notes (Signed)
Physical Therapy Treatment Patient Details Name: Shannon Moore MRN: [id number] DOB: Dec 22, 1945 Today's Date: 08/15/2019    History of Present Illness Patient is 74 y.o. female admitted on 08/12/19 with abdominal pain. Underwent ex lap of colon, mobilization of duodenum, abdominal wash out with placement of drains and lysis of adhesions same day. PMH: Rt TKA on 08/07/19 DM, HTN, HLD, CHF, OA, GERD, gout, obesity, Lt TKA in 2012.    PT Comments    Pt is terribly uncomfortable.  Able to burp x 1 during session.  Pt did progress gt distance.  Plan for HHPT.     Follow Up Recommendations  Home health PT     Equipment Recommendations  None recommended by PT    Recommendations for Other Services       Precautions / Restrictions Precautions Precautions: Fall Precaution Comments: NGT, 2 drains, IV, foley, O2, pulse ox Restrictions Weight Bearing Restrictions: Yes RLE Weight Bearing: Weight bearing as tolerated Other Position/Activity Restrictions: WBAT    Mobility  Bed Mobility Overal bed mobility: Needs Assistance Bed Mobility: Rolling;Sidelying to Sit Rolling: Mod assist Sidelying to sit: Max assist       General bed mobility comments: Pt required cues for rolling and sidelying to sit.  Transfers Overall transfer level: Needs assistance Equipment used: Rolling walker (2 wheeled) Transfers: Sit to/from Stand Sit to Stand: Min assist         General transfer comment: Cues for hand placement and min assistance to boost into standing.  Ambulation/Gait Ambulation/Gait assistance: Min assist Gait Distance (Feet): 20 Feet Assistive device: Rolling walker (2 wheeled) Gait Pattern/deviations: Decreased stride length;Step-to pattern;Trunk flexed;Antalgic     General Gait Details: Cues for upper trunk control and pacing to progress distance.  pt notably fatigued after 20 ft and required to sit in recliner to return to room.   Stairs             Wheelchair Mobility     Modified Rankin (Stroke Patients Only)       Balance Overall balance assessment: Needs assistance Sitting-balance support: Feet supported Sitting balance-Leahy Scale: Fair Sitting balance - Comments: props on at least one hand due to pain     Standing balance-Leahy Scale: Poor                              Cognition Arousal/Alertness: Awake/alert Behavior During Therapy: WFL for tasks assessed/performed Overall Cognitive Status: Within Functional Limits for tasks assessed                                        Exercises      General Comments        Pertinent Vitals/Pain Pain Assessment: 0-10 Pain Score: 8  Pain Location: abdomen ( gas pains) Pain Descriptors / Indicators: Moaning;Grimacing;Guarding Pain Intervention(s): Monitored during session;Repositioned    Home Living                      Prior Function            PT Goals (current goals can now be found in the care plan section) Acute Rehab PT Goals Patient Stated Goal: return home Potential to Achieve Goals: Good Progress towards PT goals: Progressing toward goals    Frequency    Min 5X/week      PT Plan Current plan  remains appropriate    Co-evaluation              AM-PAC PT "6 Clicks" Mobility   Outcome Measure  Help needed turning from your back to your side while in a flat bed without using bedrails?: Total Help needed moving from lying on your back to sitting on the side of a flat bed without using bedrails?: Total Help needed moving to and from a bed to a chair (including a wheelchair)?: A Little Help needed standing up from a chair using your arms (e.g., wheelchair or bedside chair)?: A Little Help needed to walk in hospital room?: A Little Help needed climbing 3-5 steps with a railing? : A Lot 6 Click Score: 13    End of Session Equipment Utilized During Treatment: Gait belt Activity Tolerance: Patient tolerated treatment well Patient  left: with call bell/phone within reach;in chair;with family/visitor present Nurse Communication: Mobility status PT Visit Diagnosis: Pain;Other abnormalities of gait and mobility (R26.89) Pain - Right/Left: Right Pain - part of body: Knee     Time: 7014-1030 PT Time Calculation (min) (ACUTE ONLY): 24 min  Charges:  $Gait Training: 8-22 mins $Therapeutic Activity: 8-22 mins                     Erasmo Leventhal , PTA Acute Rehabilitation Services Pager [phone] Office [phone]     Shannon Moore [name] 08/15/2019, 2:46 PM

## 2019-08-15 NOTE — Progress Notes (Signed)
Patient ID: Shannon Moore, female   DOB: 05/12/1945, 74 y.o.   MRN: [id number]   Acute Care Surgery Service Progress Note:    Chief Complaint/Subjective: Abd pain improving. Reports she started having a small amt flatus. No BM. Walked to the door yesterday, got OOB to chair for 1.5 hours. NG with 1250cc/24h. Denies CP, SOB, urinary sxs.   AFVSS, WBC 20 Objective: Vital signs in last 24 hours: Temp:  [98.1 F (36.7 C)-98.5 F (36.9 C)] 98.5 F (36.9 C) (08/04 0806) Pulse Rate:  [86-97] 97 (08/04 0806) Resp:  [16-20] 20 (08/04 0806) BP: (154-165)/(64-80) 154/75 (08/04 0806) SpO2:  [95 %-97 %] 95 % (08/04 0806) Last BM Date: 08/06/19  Intake/Output from previous day: 08/03 0701 - 08/04 0700 In: 1791.7 [I.V.:877.1; IV Piggyback:914.6] Out: 1955 [Urine:550; Emesis/NG output:1250; Drains:155] Intake/Output this shift: No intake/output data recorded.  Lungs: cta, nonlabored ORA; IS 500-700  Cardiovascular: reg  Abd: soft, obese, min TTP, dressing/wound c/d/i; drain - serosang NG - bilious  Extremities: no edema, +SCDs; knee in cpm; dressing removed and incision c/d/i without cellulitis or drainge.  Neuro: alert, nonfocal  Lab Results: CBC  Recent Labs    08/14/19 0234 08/15/19 0217  WBC 17.7* 20.2*  HGB 9.1* 9.8*  HCT 27.3* 29.8*  PLT 346 417*   BMET Recent Labs    08/14/19 0234 08/15/19 0217  NA 136 138  K 3.6 3.8  CL 100 99  CO2 27 30  GLUCOSE 174* 163*  BUN 25* 16  CREATININE 0.88 0.68  CALCIUM 7.8* 7.9*   LFT Hepatic Function Latest Ref Rng & Units 08/15/2019 08/14/2019 08/13/2019  Total Protein 6.5 - 8.1 g/dL 5.2(L) 5.1(L) 5.2(L)  Albumin 3.5 - 5.0 g/dL 1.9(L) 1.8(L) 2.0(L)  AST 15 - 41 U/L '21 26 30  '$ ALT 0 - 44 U/L '20 22 26  '$ Alk Phosphatase 38 - 126 U/L 77 59 56  Total Bilirubin 0.3 - 1.2 mg/dL 0.7 0.6 0.9   PT/INR No results for input(s): LABPROT, INR in the last 72 hours. ABG No results for input(s): PHART, HCO3 in the last 72 hours.  Invalid  input(s): PCO2, PO2  Studies/Results:  Anti-infectives: Anti-infectives (From admission, onward)   Start     Dose/Rate Route Frequency Ordered Stop   08/12/19 2200  vancomycin (VANCOCIN) IVPB 1000 mg/200 mL premix  Status:  Discontinued        1,000 mg 200 mL/hr over 60 Minutes Intravenous Every 24 hours 08/12/19 0553 08/12/19 1722   08/12/19 1730  cefTRIAXone (ROCEPHIN) 2 g in sodium chloride 0.9 % 100 mL IVPB     Discontinue    "And" Linked Group Details   2 g 200 mL/hr over 30 Minutes Intravenous Every 24 hours 08/12/19 1728 08/16/19 1729   08/12/19 1730  metroNIDAZOLE (FLAGYL) IVPB 500 mg     Discontinue    "And" Linked Group Details   500 mg 100 mL/hr over 60 Minutes Intravenous Every 8 hours 08/12/19 1728 08/16/19 1729   08/12/19 1000  ceFEPIme (MAXIPIME) 2 g in sodium chloride 0.9 % 100 mL IVPB  Status:  Discontinued        2 g 200 mL/hr over 30 Minutes Intravenous Every 12 hours 08/12/19 0553 08/12/19 1722   08/12/19 0145  ceFEPIme (MAXIPIME) 2 g in sodium chloride 0.9 % 100 mL IVPB        2 g 200 mL/hr over 30 Minutes Intravenous  Once 08/12/19 0139 08/12/19 0405   08/12/19 0130  aztreonam (AZACTAM)  2 g in sodium chloride 0.9 % 100 mL IVPB  Status:  Discontinued        2 g 200 mL/hr over 30 Minutes Intravenous  Once 08/12/19 0125 08/12/19 0139   08/12/19 0130  metroNIDAZOLE (FLAGYL) IVPB 500 mg        500 mg 100 mL/hr over 60 Minutes Intravenous  Once 08/12/19 0125 08/12/19 0507   08/12/19 0130  vancomycin (VANCOCIN) IVPB 1000 mg/200 mL premix        1,000 mg 200 mL/hr over 60 Minutes Intravenous  Once 08/12/19 0125 08/12/19 0709      Medications: Scheduled Meds: . Chlorhexidine Gluconate Cloth  6 each Topical Daily  . docusate  100 mg Per Tube BID  . enoxaparin (LOVENOX) injection  40 mg Subcutaneous Q24H  . insulin aspart  0-9 Units Subcutaneous Q6H  . lidocaine  1 patch Transdermal Q24H   Continuous Infusions: . cefTRIAXone (ROCEPHIN)  IV 2 g (08/14/19 1804)     And  . metronidazole 500 mg (08/15/19 1026)  . dextrose 5 % and 0.45 % NaCl with KCl 20 mEq/L Stopped (08/13/19 1754)  . methocarbamol (ROBAXIN) IV 500 mg (08/15/19 0614)   PRN Meds:.hydrALAZINE, metoprolol tartrate, ondansetron **OR** ondansetron (ZOFRAN) IV, oxyCODONE  Assessment/Plan: Patient Active Problem List   Diagnosis Date Noted  . Air in deep soft tissue of abdomen ([place]) 08/12/2019  . Pneumoperitoneum 08/12/2019  . Right knee OA 08/07/2019  . 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 Abnormal EKG 01/08/2014  . Chest pain 01/08/2014  . Dyspnea 01/08/2014  . Palpitations 01/08/2014  . Essential hypertension 01/08/2014  . Type 2 diabetes mellitus without complication ([place]) 26/37/8588  . Obesity (BMI 30-39.9) 01/08/2014  . Hyperlipidemia 01/08/2014   s/p Procedure(s): EXPLORATORY LAPAROTOMY WITH MOBILIZATION OF ASCENDING AND DESCENDING COLON, MOBILIZATION OF DUODENUM, ABDOMINAL [place] OUT AND DRAIN PLACEMENT. 08/12/2019  Pneumoperitoneum, unknown etiology, suspect perforated diverticulitis  S/P exploratory laparotomy, lysis of adhesions, Kocherization of duodenum, takedown of ascending colon, splenic flexure, and descending colon, abdominal washout, placement of JP drains x2 - POD#3 afebrile,  WBC 20 from 17 - NG- 11250 cc/24h, continue LIWS, await ROBF, potentially clamp later today vs tomorrow if bowel function continues to increase - pain meds: cont scheduled IV APAP and robaxin, cont lidoderm patch, PRN morphine  - continue IV abx - CBC and BMP in AM  - IS 10x q1h!  - leukocytosis increasing: check CXR given minimal IS use in first 48 h post-op/hypoxia to r/o developing PNA. May need repeat CT abdomen pelvis this week for persistent leukocytosis, persistent ileus, or if spikes fever.  FEN: NPO, cont IVF, lytes ok Renal: Cr back to normal ID: Rocephin/Flagyl 8/1 >> VTE: SCD's, Lovenox Foley: d/c foley POD#1 Follow up: Dr. Bobbye Morton  Dispo: floor, PT eval,  appreciate medicine following for medical management    LOS: 3 days    Obie Dredge, PA-C General Surgery [phone] [facility]

## 2019-08-15 NOTE — Progress Notes (Signed)
Occupational Therapy Treatment Patient Details Name: Shannon Moore MRN: [id number] DOB: [birth year] Today's Date: 08/15/2019    History of present illness Patient is 74 y.o. female admitted on 08/12/19 with abdominal pain. Underwent ex lap of colon, mobilization of duodenum, abdominal wash out with placement of drains and lysis of adhesions same day. PMH: Rt TKA on 08/07/19 DM, HTN, HLD, CHF, OA, GERD, gout, obesity, Lt TKA in 2012.   OT comments  Today's treatment focused on improving independence with toileting while in hospital and educating patient on compensatory strategies and potential DME needs for return home. Patient min guard for ambulating with walker to bathroom, toilet transfer and toileting. Patient able to manage wiping (after urinating) and hospital gown today. Patient provided with education on multiple methods for toileting on return home and potential needed for AE for discharge. Patient and spouse verbalized understanding.   Follow Up Recommendations  Home health OT;Supervision/Assistance - 24 hour    Equipment Recommendations  3 in 1 bedside commode    Recommendations for Other Services      Precautions / Restrictions Precautions Precautions: Fall Precaution Comments: NGT, 2 drains, IV, foley, O2, pulse ox Restrictions Weight Bearing Restrictions: No RLE Weight Bearing: Weight bearing as tolerated Other Position/Activity Restrictions: WBAT       Mobility Bed Mobility Overal bed mobility: Needs Assistance Bed Mobility: Sit to Supine Rolling: Mod assist Sidelying to sit: Max assist       General bed mobility comments: Mod assist for LEs to transfer to supine. Increased time needed and limited by pain.  Transfers Overall transfer level: Needs assistance Equipment used: Rolling walker (2 wheeled) Transfers: Sit to/from Stand Sit to Stand: Min assist Stand pivot transfers: Min guard       General transfer comment: Cues for hand placement. min guard for  safety.    Balance Overall balance assessment: Needs assistance Sitting-balance support: Feet supported;Single extremity supported Sitting balance-Leahy Scale: Fair Sitting balance - Comments: props on at least one hand due to pain   Standing balance support: During functional activity;Bilateral upper extremity supported Standing balance-Leahy Scale: Poor                             ADL either performed or assessed with clinical judgement   ADL Overall ADL's : Needs assistance/impaired                         Toilet Transfer: Min guard;RW;Ambulation;Regular Toilet;Grab bars Toilet Transfer Details (indicate cue type and reason): Patient ambulated to bathroom with RW, used grab bars, verbal cue for technique. Toileting- Water quality scientist and Hygiene: Min guard;Sit to/from stand Toileting - Clothing Manipulation Details (indicate cue type and reason): Patient able to wipe and manage hospital gown. Discussion with patient and spouse in regards to compensatory strategies for toileting at home and educated on multiple strategies.     Functional mobility during ADLs: Min guard;Rolling walker General ADL Comments: Min guard with RW to ambulate. Slow. o2 sats WFL.     Vision       Perception     Praxis      Cognition Arousal/Alertness: Awake/alert Behavior During Therapy: WFL for tasks assessed/performed Overall Cognitive Status: Within Functional Limits for tasks assessed  Exercises     Shoulder Instructions       General Comments      Pertinent Vitals/ Pain       Pain Assessment: Faces Pain Score: 8  Faces Pain Scale: Hurts little more Pain Location: abdomen ( gas pains) Pain Descriptors / Indicators: Moaning;Grimacing;Guarding Pain Intervention(s): Monitored during session  Home Living                                          Prior Functioning/Environment               Frequency  Min 2X/week        Progress Toward Goals  OT Goals(current goals can now be found in the care plan section)  Progress towards OT goals: Progressing toward goals  Acute Rehab OT Goals Patient Stated Goal: return home OT Goal Formulation: With patient Time For Goal Achievement: 08/27/19 Potential to Achieve Goals: Good  Plan Discharge plan remains appropriate    Co-evaluation          OT goals addressed during session: ADL's and self-care;Proper use of Adaptive equipment and DME      AM-PAC OT "6 Clicks" Daily Activity     Outcome Measure   Help from another person eating meals?: None Help from another person taking care of personal grooming?: A Little Help from another person toileting, which includes using toliet, bedpan, or urinal?: A Little Help from another person bathing (including washing, rinsing, drying)?: A Lot Help from another person to put on and taking off regular upper body clothing?: A Lot Help from another person to put on and taking off regular lower body clothing?: Total 6 Click Score: 15    End of Session Equipment Utilized During Treatment: Rolling walker;Gait belt  OT Visit Diagnosis: Unsteadiness on feet (R26.81);Other abnormalities of gait and mobility (R26.89);Pain;Muscle weakness (generalized) (M62.81) Pain - part of body:  (abdomen)   Activity Tolerance Patient limited by pain   Patient Left in bed;with call bell/phone within reach;with family/visitor present   Nurse Communication  (okay to see per RN)        Time: 7209-4709 OT Time Calculation (min): 26 min  Charges: OT General Charges $OT Visit: 1 Visit OT Treatments $Self Care/Home Management : 23-37 mins  Oluwatoni Rotunno, OTR/L [place]  Office [phone] Pager: [place] 08/15/2019, 5:27 PM

## 2019-08-16 LAB — CBC WITH DIFFERENTIAL/PLATELET
Abs Immature Granulocytes: 1.69 10*3/uL — ABNORMAL HIGH (ref 0.00–0.07)
Basophils Absolute: 0.2 10*3/uL — ABNORMAL HIGH (ref 0.0–0.1)
Basophils Relative: 1 %
Eosinophils Absolute: 0.2 10*3/uL (ref 0.0–0.5)
Eosinophils Relative: 1 %
HCT: 30.1 % — ABNORMAL LOW (ref 36.0–46.0)
Hemoglobin: 9.9 g/dL — ABNORMAL LOW (ref 12.0–15.0)
Immature Granulocytes: 7 %
Lymphocytes Relative: 12 %
Lymphs Abs: 2.8 10*3/uL (ref 0.7–4.0)
MCH: 29.6 pg (ref 26.0–34.0)
MCHC: 32.9 g/dL (ref 30.0–36.0)
MCV: 89.9 fL (ref 80.0–100.0)
Monocytes Absolute: 1.2 10*3/uL — ABNORMAL HIGH (ref 0.1–1.0)
Monocytes Relative: 5 %
Neutro Abs: 17.3 10*3/uL — ABNORMAL HIGH (ref 1.7–7.7)
Neutrophils Relative %: 74 %
Platelets: 466 10*3/uL — ABNORMAL HIGH (ref 150–400)
RBC: 3.35 MIL/uL — ABNORMAL LOW (ref 3.87–5.11)
RDW: 13.8 % (ref 11.5–15.5)
WBC: 23.4 10*3/uL — ABNORMAL HIGH (ref 4.0–10.5)
nRBC: 0.1 % (ref 0.0–0.2)

## 2019-08-16 LAB — URINALYSIS, COMPLETE (UACMP) WITH MICROSCOPIC
Glucose, UA: NEGATIVE mg/dL
Hgb urine dipstick: NEGATIVE
Ketones, ur: >80 mg/dL — AB
Nitrite: NEGATIVE
Protein, ur: 30 mg/dL — AB
Specific Gravity, Urine: 1.025 (ref 1.005–1.030)
pH: 5.5 (ref 5.0–8.0)

## 2019-08-16 LAB — COMPREHENSIVE METABOLIC PANEL
ALT: 18 U/L (ref 0–44)
AST: 24 U/L (ref 15–41)
Albumin: 1.9 g/dL — ABNORMAL LOW (ref 3.5–5.0)
Alkaline Phosphatase: 103 U/L (ref 38–126)
Anion gap: 13 (ref 5–15)
BUN: 12 mg/dL (ref 8–23)
CO2: 26 mmol/L (ref 22–32)
Calcium: 7.9 mg/dL — ABNORMAL LOW (ref 8.9–10.3)
Chloride: 98 mmol/L (ref 98–111)
Creatinine, Ser: 0.75 mg/dL (ref 0.44–1.00)
GFR calc Af Amer: >60 mL/min (ref >60)
GFR calc non Af Amer: >60 mL/min (ref >60)
Glucose, Bld: 179 mg/dL — ABNORMAL HIGH (ref 70–99)
Potassium: 3.7 mmol/L (ref 3.5–5.1)
Sodium: 137 mmol/L (ref 135–145)
Total Bilirubin: 0.9 mg/dL (ref 0.3–1.2)
Total Protein: 5.3 g/dL — ABNORMAL LOW (ref 6.5–8.1)

## 2019-08-16 LAB — GLUCOSE, CAPILLARY
Glucose-Capillary: 195 mg/dL — ABNORMAL HIGH (ref 70–99)
Glucose-Capillary: 174 mg/dL — ABNORMAL HIGH (ref 70–99)
Glucose-Capillary: 136 mg/dL — ABNORMAL HIGH (ref 70–99)

## 2019-08-16 MED ORDER — ACETAMINOPHEN 500 MG PO TABS
1000.0000 mg | ORAL_TABLET | Freq: Three times a day (TID) | ORAL | Status: DC
  Administered 2019-08-16 – 2019-08-18 (×7): 1000 mg via ORAL
  Filled 2019-08-16 (×7): qty 2

## 2019-08-16 MED ORDER — SODIUM CHLORIDE 0.9 % IV SOLN
2.0000 g | INTRAVENOUS | Status: DC
  Administered 2019-08-16: 2 g via INTRAVENOUS
  Filled 2019-08-16: qty 20

## 2019-08-16 MED ORDER — METRONIDAZOLE IN NACL 5-0.79 MG/ML-% IV SOLN
500.0000 mg | Freq: Three times a day (TID) | INTRAVENOUS | Status: DC
  Administered 2019-08-16 – 2019-08-17 (×3): 500 mg via INTRAVENOUS
  Filled 2019-08-16 (×3): qty 100

## 2019-08-16 MED ORDER — ZOLPIDEM TARTRATE 5 MG PO TABS
5.0000 mg | ORAL_TABLET | Freq: Every evening | ORAL | Status: DC | PRN
  Administered 2019-08-16 – 2019-08-17 (×2): 5 mg via ORAL
  Filled 2019-08-16 (×2): qty 1

## 2019-08-16 MED ORDER — MORPHINE SULFATE (PF) 2 MG/ML IV SOLN: 2.0000 mg | INTRAVENOUS | Status: DC | PRN

## 2019-08-16 MED ORDER — DOCUSATE SODIUM 50 MG/5ML PO LIQD
100.0000 mg | Freq: Two times a day (BID) | ORAL | Status: DC
  Filled 2019-08-16: qty 10

## 2019-08-16 NOTE — Progress Notes (Signed)
Physical Therapy Treatment Patient Details Name: Shannon Moore MRN: [id number] DOB: Dec 12, 1945 Today's Date: 08/16/2019    History of Present Illness Patient is 74 y.o. female admitted on 08/12/19 with abdominal pain. Underwent ex lap of colon, mobilization of duodenum, abdominal wash out with placement of drains and lysis of adhesions same day. PMH: Rt TKA on 08/07/19 DM, HTN, HLD, CHF, OA, GERD, gout, obesity, Lt TKA in 2012.    PT Comments    Pt supine in bed on arrival this session.  Pt is feeling much improved.  Able to mobilize to edge of bed with decreased assistance.  Continue to recommend HHPT.  Plan for stair training next session.    Follow Up Recommendations  Home health PT     Equipment Recommendations  None recommended by PT    Recommendations for Other Services       Precautions / Restrictions Precautions Precautions: Fall Precaution Comments: NGT, 2 drains, IV, foley, O2, pulse ox Restrictions Weight Bearing Restrictions: Yes RLE Weight Bearing: Weight bearing as tolerated Other Position/Activity Restrictions: WBAT    Mobility  Bed Mobility Overal bed mobility: Needs Assistance Bed Mobility: Supine to Sit Rolling: Supervision Sidelying to sit: Mod assist       General bed mobility comments: Pt able to roll without assistance but required moderate assistance to elevate trunk into sitting.  No pillow needed for bracing this session.  Transfers Overall transfer level: Needs assistance Equipment used: Rolling walker (2 wheeled) Transfers: Sit to/from Stand Sit to Stand: Min assist         General transfer comment: Cues for hand placement. min A for safety.  Cues for hip and trunk extension.  Ambulation/Gait Ambulation/Gait assistance: Min assist Gait Distance (Feet): 24 Feet Assistive device: Rolling walker (2 wheeled) Gait Pattern/deviations: Decreased stride length;Step-to pattern;Trunk flexed;Antalgic Gait velocity: decr   General Gait Details:  Cues for upper trunk control and pacing to progress distance.  Continues to fatigue with short bouts of gt training.  NO chair follow this session.   Stairs             Wheelchair Mobility    Modified Rankin (Stroke Patients Only)       Balance Overall balance assessment: Needs assistance Sitting-balance support: Feet supported;Single extremity supported Sitting balance-Leahy Scale: Fair Sitting balance - Comments: props on at least one hand due to pain   Standing balance support: During functional activity;Bilateral upper extremity supported Standing balance-Leahy Scale: Poor                              Cognition Arousal/Alertness: Awake/alert Behavior During Therapy: WFL for tasks assessed/performed Overall Cognitive Status: Within Functional Limits for tasks assessed                                        Exercises      General Comments        Pertinent Vitals/Pain Pain Assessment: Faces Faces Pain Scale: Hurts little more Pain Descriptors / Indicators: Moaning;Grimacing;Guarding Pain Intervention(s): Monitored during session;Repositioned    Home Living                      Prior Function            PT Goals (current goals can now be found in the care plan section) Acute Rehab PT  Goals Patient Stated Goal: return home Potential to Achieve Goals: Good Progress towards PT goals: Progressing toward goals    Frequency    Min 5X/week      PT Plan Current plan remains appropriate    Co-evaluation              AM-PAC PT "6 Clicks" Mobility   Outcome Measure  Help needed turning from your back to your side while in a flat bed without using bedrails?: A Little Help needed moving from lying on your back to sitting on the side of a flat bed without using bedrails?: A Lot Help needed moving to and from a bed to a chair (including a wheelchair)?: A Little Help needed standing up from a chair using your  arms (e.g., wheelchair or bedside chair)?: A Little Help needed to walk in hospital room?: A Little Help needed climbing 3-5 steps with a railing? : A Little 6 Click Score: 17    End of Session Equipment Utilized During Treatment: Gait belt Activity Tolerance: Patient tolerated treatment well Patient left: with call bell/phone within reach;in chair;with family/visitor present Nurse Communication: Mobility status PT Visit Diagnosis: Pain;Other abnormalities of gait and mobility (R26.89) Pain - Right/Left: Right Pain - part of body: Knee     Time: 1206-1231 PT Time Calculation (min) (ACUTE ONLY): 25 min  Charges:  $Gait Training: 8-22 mins $Therapeutic Activity: 8-22 mins                     Shannon Moore , PTA Acute Rehabilitation Services Pager [phone] Office [phone]     Shannon Moore 08/16/2019, 2:40 PM

## 2019-08-16 NOTE — Plan of Care (Signed)

## 2019-08-16 NOTE — Progress Notes (Addendum)
4 Days Post-Op  Subjective: CC: Patient reports no abdominal pain. Some nausea yesterday but none currently. Passing flatus. BM this AM. Up in chair.   Objective: Vital signs in last 24 hours: Temp:  [98.4 F (36.9 C)-98.7 F (37.1 C)] 98.5 F (36.9 C) (08/05 0824) Pulse Rate:  [96-108] 108 (08/05 0824) Resp:  [16-20] 20 (08/05 0824) BP: (145-165)/(57-92) 155/92 (08/05 0824) SpO2:  [93 %-94 %] 94 % (08/05 0824) Last BM Date: 08/06/19  Intake/Output from previous day: 08/04 0701 - 08/05 0700 In: 1982.9 [I.V.:764.9; NG/GT:60; IV Piggyback:678.1] Out: 1080 [Urine:725; Emesis/NG output:270; Drains:85] Intake/Output this shift: No intake/output data recorded.  PE: Gen:  Alert, NAD, pleasant Pulm: Normal rate and effort  Abd: Soft, mild distension, NT, +BS. Midline wound with honeycomb dressing in place. Staples below appear c/d/i. Drains x 2 SS Ext:  No LE edema. Right knee incision c/d/i Psych: A&Ox3  Skin: no rashes noted, warm and dry   Lab Results:  Recent Labs    08/15/19 0217 08/16/19 0127  WBC 20.2* 23.4*  HGB 9.8* 9.9*  HCT 29.8* 30.1*  PLT 417* 466*   BMET Recent Labs    08/15/19 0217 08/16/19 0127  NA 138 137  K 3.8 3.7  CL 99 98  CO2 30 26  GLUCOSE 163* 179*  BUN 16 12  CREATININE 0.68 0.75  CALCIUM 7.9* 7.9*   PT/INR No results for input(s): LABPROT, INR in the last 72 hours. CMP     Component Value Date/Time   NA 137 08/16/2019 0127   K 3.7 08/16/2019 0127   CL 98 08/16/2019 0127   CO2 26 08/16/2019 0127   GLUCOSE 179 (H) 08/16/2019 0127   BUN 12 08/16/2019 0127   CREATININE 0.75 08/16/2019 0127   CALCIUM 7.9 (L) 08/16/2019 0127   PROT 5.3 (L) 08/16/2019 0127   ALBUMIN 1.9 (L) 08/16/2019 0127   AST 24 08/16/2019 0127   ALT 18 08/16/2019 0127   ALKPHOS 103 08/16/2019 0127   BILITOT 0.9 08/16/2019 0127   GFRNONAA >60 08/16/2019 0127   GFRAA >60 08/16/2019 0127   Lipase  No results found for:  LIPASE     Studies/Results: DG CHEST PORT 1 VIEW  Result Date: 08/15/2019 CLINICAL DATA:  Elevated white blood cell count EXAM: PORTABLE CHEST 1 VIEW COMPARISON:  08/12/2019 FINDINGS: Gastric catheter is noted within the stomach. Cardiac shadow is stable. Aortic calcifications are seen. The lungs are well aerated bilaterally. Mild linear density is noted in the left lung base likely related to atelectasis. No pneumothorax or effusion is seen. IMPRESSION: Gastric catheter within the stomach. Mild left basilar atelectasis. Electronically Signed   By: Inez Catalina M.D.   On: 08/15/2019 12:29    Anti-infectives: Anti-infectives (From admission, onward)   Start     Dose/Rate Route Frequency Ordered Stop   08/12/19 2200  vancomycin (VANCOCIN) IVPB 1000 mg/200 mL premix  Status:  Discontinued        1,000 mg 200 mL/hr over 60 Minutes Intravenous Every 24 hours 08/12/19 0553 08/12/19 1722   08/12/19 1730  cefTRIAXone (ROCEPHIN) 2 g in sodium chloride 0.9 % 100 mL IVPB       "And" Linked Group Details   2 g 200 mL/hr over 30 Minutes Intravenous Every 24 hours 08/12/19 1728 08/15/19 1750   08/12/19 1730  metroNIDAZOLE (FLAGYL) IVPB 500 mg     Discontinue    "And" Linked Group Details   500 mg 100 mL/hr over 60 Minutes Intravenous  Every 8 hours 08/12/19 1728 08/16/19 1729   08/12/19 1000  ceFEPIme (MAXIPIME) 2 g in sodium chloride 0.9 % 100 mL IVPB  Status:  Discontinued        2 g 200 mL/hr over 30 Minutes Intravenous Every 12 hours 08/12/19 0553 08/12/19 1722   08/12/19 0145  ceFEPIme (MAXIPIME) 2 g in sodium chloride 0.9 % 100 mL IVPB        2 g 200 mL/hr over 30 Minutes Intravenous  Once 08/12/19 0139 08/12/19 0405   08/12/19 0130  aztreonam (AZACTAM) 2 g in sodium chloride 0.9 % 100 mL IVPB  Status:  Discontinued        2 g 200 mL/hr over 30 Minutes Intravenous  Once 08/12/19 0125 08/12/19 0139   08/12/19 0130  metroNIDAZOLE (FLAGYL) IVPB 500 mg        500 mg 100 mL/hr over 60 Minutes  Intravenous  Once 08/12/19 0125 08/12/19 0507   08/12/19 0130  vancomycin (VANCOCIN) IVPB 1000 mg/200 mL premix        1,000 mg 200 mL/hr over 60 Minutes Intravenous  Once 08/12/19 0125 08/12/19 0709       Assessment/Plan DM2 - SSI HTN HLD Recent right knee arthroplasty 08/08/2019 by Dr. Alvan Dame - PT/OT  - Appreciate TRH assistance in helping with above-   Pneumoperitoneum, unknown etiology, suspect perforated diverticulitis  S/Pexploratory laparotomy, lysis of adhesions, Kocherization of duodenum, takedown of ascending colon, splenic flexure, and descending colon, abdominal washout, placement of JP drains x2 - POD#4  - D/c NGT. CLD - continue IV abx - IS 10x q1h! - Mobilize. PT/OT recommending HH - leukocytosis increasing: CXR yesterday negative. UA today. Monitor. May need repeat CT abdomen pelvis this week for persistent leukocytosis, persistent ileus, or if spikes fever.  FEN: NPO, cont IVF, lytes ok Renal: Cr back to normal ID: Rocephin/Flagyl 8/1 >> afebrile,  WBC uptrending to 23  VTE: SCD's, Lovenox Foley: d/c foley POD#1. Voiding.  Follow up: Dr. Bobbye Morton Dispo: floor, PT eval, appreciate medicine following for medical management   LOS: 4 days    Shannon Moore , [facility] Surgery 08/16/2019, 9:25 AM Please see Amion for pager number during day hours 7:00am-4:30pm

## 2019-08-16 NOTE — Plan of Care (Signed)

## 2019-08-17 ENCOUNTER — Inpatient Hospital Stay (HOSPITAL_COMMUNITY): Payer: Medicare HMO

## 2019-08-17 LAB — BASIC METABOLIC PANEL
Anion gap: 11 (ref 5–15)
BUN: 9 mg/dL (ref 8–23)
CO2: 25 mmol/L (ref 22–32)
Calcium: 7.7 mg/dL — ABNORMAL LOW (ref 8.9–10.3)
Chloride: 99 mmol/L (ref 98–111)
Creatinine, Ser: 0.57 mg/dL (ref 0.44–1.00)
GFR calc Af Amer: >60 mL/min (ref >60)
GFR calc non Af Amer: >60 mL/min (ref >60)
Glucose, Bld: 167 mg/dL — ABNORMAL HIGH (ref 70–99)
Potassium: 3.4 mmol/L — ABNORMAL LOW (ref 3.5–5.1)
Sodium: 135 mmol/L (ref 135–145)

## 2019-08-17 LAB — CULTURE, BLOOD (ROUTINE X 2)
Culture: NO GROWTH
Culture: NO GROWTH
Special Requests: ADEQUATE

## 2019-08-17 LAB — CBC
HCT: 27.7 % — ABNORMAL LOW (ref 36.0–46.0)
Hemoglobin: 9.3 g/dL — ABNORMAL LOW (ref 12.0–15.0)
MCH: 30.8 pg (ref 26.0–34.0)
MCHC: 33.6 g/dL (ref 30.0–36.0)
MCV: 91.7 fL (ref 80.0–100.0)
Platelets: 460 10*3/uL — ABNORMAL HIGH (ref 150–400)
RBC: 3.02 MIL/uL — ABNORMAL LOW (ref 3.87–5.11)
RDW: 14.4 % (ref 11.5–15.5)
WBC: 29.2 10*3/uL — ABNORMAL HIGH (ref 4.0–10.5)
nRBC: 0 % (ref 0.0–0.2)

## 2019-08-17 LAB — GLUCOSE, CAPILLARY
Glucose-Capillary: 171 mg/dL — ABNORMAL HIGH (ref 70–99)
Glucose-Capillary: 166 mg/dL — ABNORMAL HIGH (ref 70–99)
Glucose-Capillary: 166 mg/dL — ABNORMAL HIGH (ref 70–99)
Glucose-Capillary: 132 mg/dL — ABNORMAL HIGH (ref 70–99)
Glucose-Capillary: 125 mg/dL — ABNORMAL HIGH (ref 70–99)

## 2019-08-17 MED ORDER — IOHEXOL 9 MG/ML PO SOLN
500.0000 mL | ORAL | Status: AC
  Administered 2019-08-17 (×2): 500 mL via ORAL

## 2019-08-17 MED ORDER — ALLOPURINOL 300 MG PO TABS
300.0000 mg | ORAL_TABLET | Freq: Every day | ORAL | Status: DC
  Administered 2019-08-17: 300 mg via ORAL
  Filled 2019-08-17: qty 1

## 2019-08-17 MED ORDER — METRONIDAZOLE IN NACL 5-0.79 MG/ML-% IV SOLN
500.0000 mg | Freq: Three times a day (TID) | INTRAVENOUS | Status: DC
  Administered 2019-08-17 – 2019-08-18 (×3): 500 mg via INTRAVENOUS
  Filled 2019-08-17 (×3): qty 100

## 2019-08-17 MED ORDER — HYDRALAZINE HCL 20 MG/ML IJ SOLN: 5.0000 mg | Freq: Three times a day (TID) | INTRAMUSCULAR | Status: DC | PRN

## 2019-08-17 MED ORDER — SODIUM CHLORIDE 0.9 % IV SOLN: 1.0000 g | INTRAVENOUS | Status: DC

## 2019-08-17 MED ORDER — HYDROCHLOROTHIAZIDE 25 MG PO TABS
25.0000 mg | ORAL_TABLET | Freq: Every day | ORAL | Status: DC
  Administered 2019-08-18: 25 mg via ORAL
  Filled 2019-08-17: qty 1

## 2019-08-17 MED ORDER — SODIUM CHLORIDE 0.9 % IV SOLN
2.0000 g | INTRAVENOUS | Status: DC
  Administered 2019-08-17: 2 g via INTRAVENOUS
  Filled 2019-08-17: qty 20

## 2019-08-17 MED ORDER — PRAVASTATIN SODIUM 40 MG PO TABS
80.0000 mg | ORAL_TABLET | Freq: Every day | ORAL | Status: DC
  Administered 2019-08-17: 80 mg via ORAL
  Filled 2019-08-17: qty 2

## 2019-08-17 MED ORDER — METOPROLOL SUCCINATE ER 25 MG PO TB24
25.0000 mg | ORAL_TABLET | Freq: Every day | ORAL | Status: DC
  Administered 2019-08-17: 25 mg via ORAL
  Filled 2019-08-17: qty 1

## 2019-08-17 MED ORDER — IOHEXOL 350 MG/ML SOLN
100.0000 mL | Freq: Once | INTRAVENOUS | Status: AC | PRN
  Administered 2019-08-17: 100 mL via INTRAVENOUS

## 2019-08-17 NOTE — Progress Notes (Addendum)
5 Days Post-Op  Subjective: CC: Feeling better. Only had a few sips of CLD yesterday. No nausea or emesis but is having burping/belching. Passing flatus. Had 5 episodes of diarrhea yesterday. Working well with therapies. She notes some lower abdominal pain and right knee pain that is mild and improved from yesterday.   Objective: Vital signs in last 24 hours: Temp:  [98.3 F (36.8 C)-98.9 F (37.2 C)] 98.3 F (36.8 C) (08/06 0900) Pulse Rate:  [88-96] 88 (08/06 0900) Resp:  [20] 20 (08/06 0437) BP: (153-160)/(64-71) 155/68 (08/06 0900) SpO2:  [93 %-96 %] 94 % (08/06 0900) Last BM Date: 08/16/19  Intake/Output from previous day: 08/05 0701 - 08/06 0700 In: 689.7 [P.O.:240; I.V.:312; IV Piggyback:137.7] Out: 335 [Urine:300; Drains:35] Intake/Output this shift: Total I/O In: 240 [P.O.:240] Out: -   PE: Gen:  Alert, NAD, pleasant Heart: RRR Lungs: CTA b/l, normal rate and effort. On RA. Pulling 1000 on IS Abd: Soft, mild distension, NT, +BS. Midline wound honeycomb dressing removed. Midline with staples in place, c/d/i. No signs of infection. Drains x 2 SS Ext:  No LE edema. No calf tenderness b/l. Right knee incision c/d/i. No signs of infection. Psych: A&Ox3  Skin: no rashes noted, warm and dry  Lab Results:  Recent Labs    08/16/19 0127 08/17/19 0517  WBC 23.4* 29.2*  HGB 9.9* 9.3*  HCT 30.1* 27.7*  PLT 466* 460*   BMET Recent Labs    08/16/19 0127 08/17/19 0517  NA 137 135  K 3.7 3.4*  CL 98 99  CO2 26 25  GLUCOSE 179* 167*  BUN 12 9  CREATININE 0.75 0.57  CALCIUM 7.9* 7.7*   PT/INR No results for input(s): LABPROT, INR in the last 72 hours. CMP     Component Value Date/Time   NA 135 08/17/2019 0517   K 3.4 (L) 08/17/2019 0517   CL 99 08/17/2019 0517   CO2 25 08/17/2019 0517   GLUCOSE 167 (H) 08/17/2019 0517   BUN 9 08/17/2019 0517   CREATININE 0.57 08/17/2019 0517   CALCIUM 7.7 (L) 08/17/2019 0517   PROT 5.3 (L) 08/16/2019 0127   ALBUMIN  1.9 (L) 08/16/2019 0127   AST 24 08/16/2019 0127   ALT 18 08/16/2019 0127   ALKPHOS 103 08/16/2019 0127   BILITOT 0.9 08/16/2019 0127   GFRNONAA >60 08/17/2019 0517   GFRAA >60 08/17/2019 0517   Lipase  No results found for: LIPASE     Studies/Results: DG CHEST PORT 1 VIEW  Result Date: 08/15/2019 CLINICAL DATA:  Elevated white blood cell count EXAM: PORTABLE CHEST 1 VIEW COMPARISON:  08/12/2019 FINDINGS: Gastric catheter is noted within the stomach. Cardiac shadow is stable. Aortic calcifications are seen. The lungs are well aerated bilaterally. Mild linear density is noted in the left lung base likely related to atelectasis. No pneumothorax or effusion is seen. IMPRESSION: Gastric catheter within the stomach. Mild left basilar atelectasis. Electronically Signed   By: Inez Catalina M.D.   On: 08/15/2019 12:29    Anti-infectives: Anti-infectives (From admission, onward)   Start     Dose/Rate Route Frequency Ordered Stop   08/16/19 2100  metroNIDAZOLE (FLAGYL) IVPB 500 mg     Discontinue    "And" Linked Group Details   500 mg 100 mL/hr over 60 Minutes Intravenous Every 8 hours 08/16/19 0933 08/20/19 2059   08/16/19 1700  cefTRIAXone (ROCEPHIN) 2 g in sodium chloride 0.9 % 100 mL IVPB     Discontinue    "  And" Linked Group Details   2 g 200 mL/hr over 30 Minutes Intravenous Every 24 hours 08/16/19 0933 08/20/19 1659   08/12/19 2200  vancomycin (VANCOCIN) IVPB 1000 mg/200 mL premix  Status:  Discontinued        1,000 mg 200 mL/hr over 60 Minutes Intravenous Every 24 hours 08/12/19 0553 08/12/19 1722   08/12/19 1730  cefTRIAXone (ROCEPHIN) 2 g in sodium chloride 0.9 % 100 mL IVPB       "And" Linked Group Details   2 g 200 mL/hr over 30 Minutes Intravenous Every 24 hours 08/12/19 1728 08/15/19 1750   08/12/19 1730  metroNIDAZOLE (FLAGYL) IVPB 500 mg       "And" Linked Group Details   500 mg 100 mL/hr over 60 Minutes Intravenous Every 8 hours 08/12/19 1728 08/16/19 1136   08/12/19  1000  ceFEPIme (MAXIPIME) 2 g in sodium chloride 0.9 % 100 mL IVPB  Status:  Discontinued        2 g 200 mL/hr over 30 Minutes Intravenous Every 12 hours 08/12/19 0553 08/12/19 1722   08/12/19 0145  ceFEPIme (MAXIPIME) 2 g in sodium chloride 0.9 % 100 mL IVPB        2 g 200 mL/hr over 30 Minutes Intravenous  Once 08/12/19 0139 08/12/19 0405   08/12/19 0130  aztreonam (AZACTAM) 2 g in sodium chloride 0.9 % 100 mL IVPB  Status:  Discontinued        2 g 200 mL/hr over 30 Minutes Intravenous  Once 08/12/19 0125 08/12/19 0139   08/12/19 0130  metroNIDAZOLE (FLAGYL) IVPB 500 mg        500 mg 100 mL/hr over 60 Minutes Intravenous  Once 08/12/19 0125 08/12/19 0507   08/12/19 0130  vancomycin (VANCOCIN) IVPB 1000 mg/200 mL premix        1,000 mg 200 mL/hr over 60 Minutes Intravenous  Once 08/12/19 0125 08/12/19 0709       Assessment/Plan DM2 - SSI HTN - home meds HLD - home meds  Recent right knee arthroplasty 08/08/2019 by Dr. Alvan Dame - PT/OT  AKI - resolved - TRH has signed off-   Pneumoperitoneum, unknown etiology, suspect perforated diverticulitis  S/Pexploratory laparotomy, lysis of adhesions, Kocherization of duodenum, takedown of ascending colon, splenic flexure, and descending colon, abdominal washout, placement of JP drains x2, Dr. Bobbye Morton, 08/12/2019 - POD #5 - NPO for CT. If negative consider FLD - Continue IV abx - IS 10x q1h! - Mobilize. PT/OT recommending HH - Persistent leukocytosis: CXR negative. UA was not a clean catch but not obvious for a UTI. I have sent a UCx. Obtain CT A/P today. Having some diarrhea but do not think she needs a C. Diff test at this time. Monitor.   FEN: NPO for CT. Re-eval after results. IVF (switch to NS when back on diet) Renal:Cr back to normal ID: Rocephin/Flagyl 8/1 >> afebrile, WBC uptrending to 29. Reassess abx duration after CT VTE: SCD's, Lovenox Foley: d/c foleyPOD#1. Voiding.  Follow up: Dr. Bobbye Morton Dispo: CT A/P. PT/OT rec [facility].     LOS: 5 days    Shannon Moore , [facility] Surgery 08/17/2019, 10:24 AM Please see Amion for pager number during day hours 7:00am-4:30pm

## 2019-08-17 NOTE — Progress Notes (Signed)
Occupational Therapy Treatment Patient Details Name: Shannon Moore MRN: [id number] DOB: [birth year] Today's Date: 08/17/2019    History of present illness Patient is 74 y.o. female admitted on 08/12/19 with abdominal pain. Underwent ex lap of colon, mobilization of duodenum, abdominal wash out with placement of drains and lysis of adhesions same day. PMH: Rt TKA on 08/07/19 DM, HTN, HLD, CHF, OA, GERD, gout, obesity, Lt TKA in 2012.   OT comments  Patient continues to be limited by abdominal pain but improving activity tolerance and participation in self care tasks. Patient able to ambulate to bathroom with RW with min guard, perform toileting with mod assist, max assist for lower body bathing after BM accident, and stand at sink to wash her hands. Patient instructed on use of AE for lower body dressing. Patient demonstrated use to donn and doff socks and donn pseudo underwear. Patient progressing towards goals and hopes to go home soon. Continue POC.   Follow Up Recommendations  Home health OT;Supervision/Assistance - 24 hour    Equipment Recommendations  Other (comment) (AE, toilet bidet)    Recommendations for Other Services      Precautions / Restrictions Precautions Precautions: Fall Precaution Comments: 2 JP drains, Restrictions Weight Bearing Restrictions: No RLE Weight Bearing: Weight bearing as tolerated       Mobility Bed Mobility Overal bed mobility: Needs Assistance             General bed mobility comments: Patient seated in recliner when therapsit entered room.  Transfers Overall transfer level: Needs assistance Equipment used: Rolling walker (2 wheeled) Transfers: Sit to/from Stand Sit to Stand: Min guard Stand pivot transfers: Min guard       General transfer comment: Min guard for ambulation with RW.    Balance Overall balance assessment: Needs assistance Sitting-balance support: Feet supported;No upper extremity supported Sitting balance-Leahy  Scale: Fair Sitting balance - Comments: props on at least one hand due to pain   Standing balance support: Single extremity supported;During functional activity Standing balance-Leahy Scale: Poor Standing balance comment: Reliant on one upper extremity support.                           ADL either performed or assessed with clinical judgement   ADL   Eating/Feeding: Independent Eating/Feeding Details (indicate cue type and reason): Patient drinking contrast independently for CT. Grooming: Standing;Wash/dry hands;Min guard Grooming Details (indicate cue type and reason): Patient stood at sink to wash hands after toileting.     Lower Body Bathing: Sit to/from stand;Maximal assistance Lower Body Bathing Details (indicate cue type and reason): Patient able to wash thighs and periarea but needed assistance with back of thighs and buttocks. Patient unable to reach lower legs.     Lower Body Dressing: Maximal assistance Lower Body Dressing Details (indicate cue type and reason): Max assist to donn disposable underwear. Able to assist with pulling up over hips though difficulty due to material. Patient instruced on use of adaptive equipment for lower bodyd ressing. Patinet doffed and donned socks and donned pseudounderwear with LHR. Patient had some difficutly using sock aide due to arthritis in hands. Toilet Transfer: Min Engineer, water Details (indicate cue type and reason): Patient ambulated to bathroom with RW, used grab bar to sit and stand. Toileting- Clothing Manipulation and Hygiene: Sit to/from stand;Minimal assistance Toileting - Clothing Manipulation Details (indicate cue type and reason): Min assist for toileting - patient able to assist with managing hospital  gown and wipe herself. Hospital gown management complicated by JP drains.             Vision       Perception     Praxis      Cognition Arousal/Alertness:  Awake/alert Behavior During Therapy: WFL for tasks assessed/performed Overall Cognitive Status: Within Functional Limits for tasks assessed                                          Exercises Total Joint Exercises Quad Sets: AROM;Right;10 reps;Seated   Shoulder Instructions       General Comments      Pertinent Vitals/ Pain       Pain Assessment: Faces Faces Pain Scale: Hurts little more Pain Location: abdomen - with sitting/standing Pain Descriptors / Indicators: Moaning;Grimacing;Guarding Pain Intervention(s): Monitored during session  Home Living                                          Prior Functioning/Environment              Frequency           Progress Toward Goals  OT Goals(current goals can now be found in the care plan section)  Progress towards OT goals: Progressing toward goals  Acute Rehab OT Goals Patient Stated Goal: return home OT Goal Formulation: With patient Time For Goal Achievement: 08/27/19 Potential to Achieve Goals: Good  Plan Discharge plan remains appropriate    Co-evaluation          OT goals addressed during session: ADL's and self-care;Proper use of Adaptive equipment and DME      AM-PAC OT "6 Clicks" Daily Activity     Outcome Measure   Help from another person eating meals?: None Help from another person taking care of personal grooming?: None Help from another person toileting, which includes using toliet, bedpan, or urinal?: A Little Help from another person bathing (including washing, rinsing, drying)?: A Lot Help from another person to put on and taking off regular upper body clothing?: A Little Help from another person to put on and taking off regular lower body clothing?: A Lot 6 Click Score: 18    End of Session Equipment Utilized During Treatment: Rolling walker;Gait belt CPM Right Knee CPM Right Knee: Off  OT Visit Diagnosis: Unsteadiness on feet (R26.81);Other  abnormalities of gait and mobility (R26.89);Pain;Muscle weakness (generalized) (M62.81) Pain - part of body:  (abdomen)   Activity Tolerance Patient tolerated treatment well   Patient Left in chair;with call bell/phone within reach;with chair alarm set   Nurse Communication  (okay to see per RN)        Time: 5366-4403 OT Time Calculation (min): 29 min  Charges: OT General Charges $OT Visit: 1 Visit OT Treatments $Self Care/Home Management : 23-37 mins  Antoninette Lerner, OTR/L [place]  Office [phone] Pager: [place] 08/17/2019, 12:35 PM

## 2019-08-17 NOTE — Progress Notes (Signed)
Physical Therapy Treatment Patient Details Name: Shannon Moore MRN: [id number] DOB: 1945-01-14 Today's Date: 08/17/2019    History of Present Illness Patient is 74 y.o. female admitted on 08/12/19 with abdominal pain. Underwent ex lap of colon, mobilization of duodenum, abdominal wash out with placement of drains and lysis of adhesions same day. PMH: Rt TKA on 08/07/19 DM, HTN, HLD, CHF, OA, GERD, gout, obesity, Lt TKA in 2012.    PT Comments    Pt up in bathroom on commode upon arrival of PT, agreeable to session with focus on stair training and progression of ambulation. The pt was able to demo two short bouts of walking (15 ft and 20 ft) with RW, as well as good stability and tolerance for navigation of 1 step. The pt was able to demo multiple techniques well without assist or cues when using a RW. The pt continues to demo limited endurance and functional ROM of her R knee, and will continue to benefit from skilled PT to further address these limitations both acutely and following d/c in order to return to prior level of function and independence.    Follow Up Recommendations  Home health PT     Equipment Recommendations  None recommended by PT    Recommendations for Other Services       Precautions / Restrictions Precautions Precautions: Fall Precaution Comments: 2 JP drains, Restrictions Weight Bearing Restrictions: Yes RLE Weight Bearing: Weight bearing as tolerated    Mobility  Bed Mobility Overal bed mobility: Needs Assistance             General bed mobility comments: pt on commode and left in recliner  Transfers Overall transfer level: Needs assistance Equipment used: Rolling walker (2 wheeled) Transfers: Sit to/from Stand Sit to Stand: Min assist Stand pivot transfers: Min guard       General transfer comment: Cues for hand placement. min A for safety.  Cues for hip and trunk extension.  Ambulation/Gait Ambulation/Gait assistance: Min assist Gait  Distance (Feet): 15 Feet (+ 50ft) Assistive device: Rolling walker (2 wheeled) Gait Pattern/deviations: Decreased stride length;Step-to pattern;Trunk flexed;Antalgic Gait velocity: decreased Gait velocity interpretation: <1.31 ft/sec, indicative of household ambulator General Gait Details: Cues for upper trunk control and pacing to progress distance.  Continues to fatigue with short bouts of gt training. 15 ft from bathroom to recliner, then 20 ft back towards room after stairs in gym   Stairs Stairs: Yes Stairs assistance: Supervision Stair Management: Forwards;With walker;Step to pattern Number of Stairs: 1 (x3) General stair comments: VCs safety, technique, sequence. Practiced x 3 to tweak performance and trial backwards technique. pt prefers forwards   Wheelchair Mobility    Modified Rankin (Stroke Patients Only)       Balance Overall balance assessment: Needs assistance Sitting-balance support: Feet supported;Single extremity supported Sitting balance-Leahy Scale: Fair Sitting balance - Comments: props on at least one hand due to pain   Standing balance support: During functional activity;Bilateral upper extremity supported Standing balance-Leahy Scale: Poor Standing balance comment: reliant on BUE support                            Cognition Arousal/Alertness: Awake/alert Behavior During Therapy: WFL for tasks assessed/performed Overall Cognitive Status: Within Functional Limits for tasks assessed  Exercises Total Joint Exercises Quad Sets: AROM;Right;10 reps;Seated    General Comments        Pertinent Vitals/Pain Pain Assessment: Faces Faces Pain Scale: Hurts even more Pain Location: abdomen and R knee with flexion Pain Descriptors / Indicators: Moaning;Grimacing;Guarding Pain Intervention(s): Monitored during session;Limited activity within patient's tolerance;Patient requesting pain meds-RN  notified           PT Goals (current goals can now be found in the care plan section) Acute Rehab PT Goals Patient Stated Goal: return home PT Goal Formulation: With patient Time For Goal Achievement: 08/27/19 Potential to Achieve Goals: Good Progress towards PT goals: Progressing toward goals    Frequency    Min 5X/week      PT Plan Current plan remains appropriate       AM-PAC PT "6 Clicks" Mobility   Outcome Measure  Help needed turning from your back to your side while in a flat bed without using bedrails?: A Little Help needed moving from lying on your back to sitting on the side of a flat bed without using bedrails?: A Lot Help needed moving to and from a bed to a chair (including a wheelchair)?: A Little Help needed standing up from a chair using your arms (e.g., wheelchair or bedside chair)?: A Little Help needed to walk in hospital room?: A Little Help needed climbing 3-5 steps with a railing? : A Little 6 Click Score: 17    End of Session Equipment Utilized During Treatment: Gait belt Activity Tolerance: Patient tolerated treatment well;Patient limited by fatigue Patient left: with call bell/phone within reach;in chair;with chair alarm set Nurse Communication: Mobility status PT Visit Diagnosis: Pain;Other abnormalities of gait and mobility (R26.89) Pain - Right/Left: Right Pain - part of body: Knee     Time: 0751-0829 PT Time Calculation (min) (ACUTE ONLY): 38 min  Charges:  $Gait Training: 23-37 mins $Therapeutic Activity: 8-22 mins                     Karma Ganja, PT, DPT   Acute Rehabilitation Department Pager #: [phone]   Otho Bellows 08/17/2019, 10:34 AM

## 2019-08-18 LAB — URINE CULTURE: Culture: <10000 — AB

## 2019-08-18 LAB — GLUCOSE, CAPILLARY
Glucose-Capillary: 114 mg/dL — ABNORMAL HIGH (ref 70–99)
Glucose-Capillary: 151 mg/dL — ABNORMAL HIGH (ref 70–99)

## 2019-08-18 MED ORDER — METRONIDAZOLE 500 MG PO TABS
500.0000 mg | ORAL_TABLET | Freq: Three times a day (TID) | ORAL | 0 refills | Status: AC
Start: 2019-08-18 — End: 2019-09-01

## 2019-08-18 MED ORDER — CIPROFLOXACIN HCL 500 MG PO TABS
500.0000 mg | ORAL_TABLET | Freq: Two times a day (BID) | ORAL | 0 refills | Status: AC
Start: 2019-08-18 — End: 2019-08-28

## 2019-08-18 NOTE — Progress Notes (Signed)
Physical Therapy Treatment Patient Details Name: Shannon FLIPPO MRN: [id number] DOB: 1945-02-08 Today's Date: 08/18/2019    History of Present Illness Patient is 74 y.o. female admitted on 08/12/19 with abdominal pain. Underwent ex lap of colon, mobilization of duodenum, abdominal wash out with placement of drains and lysis of adhesions same day. PMH: Rt TKA on 08/07/19 DM, HTN, HLD, CHF, OA, GERD, gout, obesity, Lt TKA in 2012.    PT Comments    Pt in bed upon arrival of PT, agreeable to session with focus on progression of ambulation and functional ROM and strengthening of R knee. The pt was able to demo significant improvements in ambulation distance with minG for safety only and use of RW. The pt was also able to complete exercises for AROM of her R knee with improved knee flexion and active knee extension against gravity compared to past sessions. The pt will continue to benefit from skilled PT to further progress safety and stability with mobility, but is safe to return home with assist of family when medically cleared.     Follow Up Recommendations  Home health PT     Equipment Recommendations  None recommended by PT    Recommendations for Other Services       Precautions / Restrictions Precautions Precautions: Fall Precaution Comments: 2 JP drains Restrictions Weight Bearing Restrictions: Yes RLE Weight Bearing: Weight bearing as tolerated    Mobility  Bed Mobility Overal bed mobility: Needs Assistance Bed Mobility: Supine to Sit     Supine to sit: HOB elevated;Min assist     General bed mobility comments: minA to raise trunk from elevated HOB  Transfers Overall transfer level: Needs assistance Equipment used: Rolling walker (2 wheeled) Transfers: Sit to/from Stand Sit to Stand: Min guard         General transfer comment: minG for safety, VC for hand placement once  Ambulation/Gait Ambulation/Gait assistance: Min guard Gait Distance (Feet): 35 Feet (+  42ft) Assistive device: Rolling walker (2 wheeled) Gait Pattern/deviations: Decreased stride length;Step-to pattern;Trunk flexed;Antalgic Gait velocity: 0.1 m/s Gait velocity interpretation: <1.31 ft/sec, indicative of household ambulator General Gait Details: pt with improved posture without cues, fatigued after 35 ft (seated rest in gym) then 35 ft back to room with significant fatigue.   Stairs             Wheelchair Mobility    Modified Rankin (Stroke Patients Only)       Balance Overall balance assessment: Needs assistance Sitting-balance support: Feet supported;No upper extremity supported Sitting balance-Leahy Scale: Good Sitting balance - Comments: when feet supported, no UE support   Standing balance support: Single extremity supported;During functional activity Standing balance-Leahy Scale: Poor Standing balance comment: Reliant on one upper extremity support.                            Cognition Arousal/Alertness: Awake/alert Behavior During Therapy: WFL for tasks assessed/performed Overall Cognitive Status: Within Functional Limits for tasks assessed                                        Exercises Total Joint Exercises Heel Slides: AROM;Right;10 reps;Seated Long Arc Quad: AROM;Both;5 reps;10 reps;Seated Knee Flexion: Right;Seated;AROM;10 reps (against light manual resistance) Goniometric ROM: ~10-75 deg    General Comments General comments (skin integrity, edema, etc.): VSS on RA. pt reports all education complete  with no questions      Pertinent Vitals/Pain Pain Assessment: Faces Faces Pain Scale: Hurts a little bit Pain Location: R knee with mobility Pain Descriptors / Indicators: Grimacing;Guarding Pain Intervention(s): Limited activity within patient's tolerance;Monitored during session;Repositioned           PT Goals (current goals can now be found in the care plan section) Acute Rehab PT Goals Patient Stated  Goal: return home PT Goal Formulation: With patient Time For Goal Achievement: 08/27/19 Potential to Achieve Goals: Good Progress towards PT goals: Progressing toward goals    Frequency    Min 5X/week      PT Plan Current plan remains appropriate       AM-PAC PT "6 Clicks" Mobility   Outcome Measure  Help needed turning from your back to your side while in a flat bed without using bedrails?: A Little Help needed moving from lying on your back to sitting on the side of a flat bed without using bedrails?: A Little Help needed moving to and from a bed to a chair (including a wheelchair)?: A Little Help needed standing up from a chair using your arms (e.g., wheelchair or bedside chair)?: A Little Help needed to walk in hospital room?: A Little Help needed climbing 3-5 steps with a railing? : A Little 6 Click Score: 18    End of Session Equipment Utilized During Treatment: Gait belt Activity Tolerance: Patient tolerated treatment well;Patient limited by fatigue Patient left: with call bell/phone within reach;in chair;with chair alarm set Nurse Communication: Mobility status PT Visit Diagnosis: Pain;Other abnormalities of gait and mobility (R26.89) Pain - Right/Left: Right Pain - part of body: Knee     Time: 4174-0814 PT Time Calculation (min) (ACUTE ONLY): 37 min  Charges:  $Gait Training: 8-22 mins $Therapeutic Exercise: 8-22 mins                     Karma Ganja, PT, DPT   Acute Rehabilitation Department Pager #: [phone]   Otho Bellows 08/18/2019, 12:25 PM

## 2019-08-18 NOTE — Discharge Summary (Signed)
Physician Discharge Summary  Patient ID: Shannon Moore MRN: [id number] DOB/AGE: 03-11-1945 74 y.o.  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 Alvan Dame with abdominal pain and found to have free air.  Laparotomy and drains placed.  On IV antibiotics and liquids PO and ready for discharge on Sat--has family at home to assist with care.  One JP is creamy  Consults: none  Significant Diagnostic Studies: none    Discharge Exam: Blood pressure (!) 147/72, pulse 93, temperature 98.4 F (36.9 C), temperature source Oral, resp. rate 16, height 5\' 1"  (1.549 m), weight 74.8 kg, SpO2 93 %. Staples in incision without redness;  There is creamy discharge in one of the JP drains.    Disposition: Discharge disposition: 01-Home or Self Care       Discharge Instructions    Call MD for:  redness, tenderness, or signs of infection (pain, swelling, redness, odor or green/yellow discharge around incision site)   Complete by: As directed    Diet full liquid   Complete by: As directed    Discharge instructions   Complete by: As directed    Drain and record JP output Stay on full liquid diet Take oral cipro and flagyl Apply neosporin to the staple line   Increase activity slowly   Complete by: As directed      Allergies as of 08/18/2019      Reactions   Atorvastatin Other (See Comments)   Intolerance   Sertraline Other (See Comments)   Congestive heart failure.   Sulfa Antibiotics Itching   Sulfasalazine Itching   Penicillins Rash   Tolerated Cephalosporin Date: 08/07/19. Has patient had  a PCN reaction causing immediate rash, facial/tongue/throat swelling, SOB or lightheadedness with hypotension: Yes Has patient had a PCN reaction causing severe rash involving mucus membranes or skin necrosis: No Has patient had a PCN reaction that required hospitalization: No Has patient had a PCN reaction occurring within the last 10 years: No If all of the above answers are "NO", then may proceed with Cephalosporin use.      Medication List    TAKE these medications   allopurinol 300 MG tablet Commonly known as: ZYLOPRIM Take 300 mg by mouth at bedtime.   aspirin 81 MG chewable tablet Commonly known as: Aspirin Childrens Chew 1 tablet (81 mg total) by mouth 2 (two) times daily. Take for 4 weeks, then resume regular dose. What changed: additional instructions   cetirizine 10 MG tablet Commonly known as: ZYRTEC Take 10 mg by mouth daily.   ciprofloxacin 500 MG tablet Commonly known as: Cipro Take 1 tablet (500 mg total) by mouth 2 (two) times daily for 10 days.   docusate sodium 100 MG capsule Commonly known as: Colace Take 1 capsule (100 mg total) by mouth 2 (two) times daily.   ferrous sulfate 325 (65 FE) MG tablet Commonly known as: FerrouSul Take 1 tablet (325 mg total) by mouth 3 (three) times daily with meals for 14 days.   fluticasone 50 MCG/ACT nasal spray Commonly known as: FLONASE Place 2 sprays into both nostrils daily as  needed for allergies or rhinitis.   gabapentin 100 MG capsule Commonly known as: NEURONTIN Take 200 mg by mouth at bedtime.   hydrochlorothiazide 25 MG tablet Commonly known as: HYDRODIURIL Take 25 mg by mouth daily.   HYDROcodone-acetaminophen 7.5-325 MG tablet Commonly known as: Norco Take 1-2 tablets by mouth every 4 (four) hours as needed for moderate pain.   metFORMIN 500 MG 24 hr tablet Commonly known as: GLUCOPHAGE-XR Take 1,000 mg by mouth 2 (two) times daily with a meal.   methocarbamol 500 MG tablet Commonly known as:  Robaxin Take 1 tablet (500 mg total) by mouth every 6 (six) hours as needed for muscle spasms.   metoprolol succinate 25 MG 24 hr tablet Commonly known as: TOPROL-XL Take 25 mg by mouth daily.   metroNIDAZOLE 500 MG tablet Commonly known as: Flagyl Take 1 tablet (500 mg total) by mouth 3 (three) times daily for 14 days.   olmesartan 40 MG tablet Commonly known as: BENICAR Take 40 mg by mouth daily.   polyethylene glycol 17 g packet Commonly known as: MIRALAX / GLYCOLAX Take 17 g by mouth 2 (two) times daily.   pravastatin 80 MG tablet Commonly known as: PRAVACHOL Take 80 mg by mouth at bedtime.            Durable Medical Equipment  (From admission, onward)         Start     Ordered   08/16/19 0936  For home use only DME 3 n 1  Once        08/16/19 0938          Follow-up Information    Jesusita Oka, MD. Schedule an appointment as soon as possible for a visit in 1 week(s).   Specialty: Surgery Contact information: [address] [place] [address] [phone]               Signed: Pedro Earls 08/18/2019, 10:14 AM

## 2019-08-18 NOTE — Plan of Care (Signed)

## 2019-08-18 NOTE — TOC Transition Note (Signed)
Transition of Care [facility]) - CM/SW Discharge Note   Patient Details  Name: Shannon Moore MRN: [id number] Date of Birth: [birth year]  Transition of Care [facility]) CM/SW Contact:  Claudie Leach, RN 08/18/2019, 11:19 AM   Clinical Narrative:    Patient to d/c home with HHPT.  Patient has no preference of agency. Medicare rated list discussed with patient and patient agreeable to [facility].  Cory with [facility] accepted referral.    Patient states she doesn't have any DME needs.  She has a borrowed 3n1 and received RW recently.     Final next level of care: [place] Barriers to Discharge: No Barriers Identified    Discharge Plan and Services    HH Arranged: PT [facility] Agency: [place] Date [place]: 08/18/19 Time [place]: 1119 Representative spoke with at [place]: Tommi Rumps

## 2019-08-18 NOTE — Progress Notes (Signed)
Provided discharge education/instructions, demonstrated draining and charging of JP drain to Pt and her husband, all questions and concerns addressed, Pt not in distress, discharged home with belongings.

## 2019-08-18 NOTE — Discharge Instructions (Signed)
JP Drain Totals °· Bring this sheet to all of your post-operative appointments while you have your drains. °· Please measure your drains by CC's or ML's. °· Make sure you drain and measure your JP Drains 3 times per day. °· At the end of each day, add up totals for the left side and add up totals for the right side. °   ( 9 am )     ( 3 pm )        ( 9 pm )                °Date L  R  L  R  L  R  Total L/R  °               °               °               °               °               °               °               °               °               °               °               °               ° °

## 2019-08-18 NOTE — Plan of Care (Signed)

## 2019-08-21 DIAGNOSIS — E119 Type 2 diabetes mellitus without complications: Secondary | ICD-10-CM | POA: Diagnosis not present

## 2019-08-21 DIAGNOSIS — Z792 Long term (current) use of antibiotics: Secondary | ICD-10-CM | POA: Diagnosis not present

## 2019-08-21 DIAGNOSIS — Z96651 Presence of right artificial knee joint: Secondary | ICD-10-CM | POA: Diagnosis not present

## 2019-08-21 DIAGNOSIS — Z7982 Long term (current) use of aspirin: Secondary | ICD-10-CM | POA: Diagnosis not present

## 2019-08-21 DIAGNOSIS — Z471 Aftercare following joint replacement surgery: Secondary | ICD-10-CM | POA: Diagnosis not present

## 2019-08-21 DIAGNOSIS — I10 Essential (primary) hypertension: Secondary | ICD-10-CM | POA: Diagnosis not present

## 2019-08-21 DIAGNOSIS — Z79899 Other long term (current) drug therapy: Secondary | ICD-10-CM | POA: Diagnosis not present

## 2019-08-21 DIAGNOSIS — Z7984 Long term (current) use of oral hypoglycemic drugs: Secondary | ICD-10-CM | POA: Diagnosis not present

## 2019-08-21 DIAGNOSIS — Z48815 Encounter for surgical aftercare following surgery on the digestive system: Secondary | ICD-10-CM | POA: Diagnosis not present

## 2019-08-22 DIAGNOSIS — Z79899 Other long term (current) drug therapy: Secondary | ICD-10-CM | POA: Diagnosis not present

## 2019-08-22 DIAGNOSIS — Z7982 Long term (current) use of aspirin: Secondary | ICD-10-CM | POA: Diagnosis not present

## 2019-08-22 DIAGNOSIS — Z48815 Encounter for surgical aftercare following surgery on the digestive system: Secondary | ICD-10-CM | POA: Diagnosis not present

## 2019-08-22 DIAGNOSIS — Z7984 Long term (current) use of oral hypoglycemic drugs: Secondary | ICD-10-CM | POA: Diagnosis not present

## 2019-08-22 DIAGNOSIS — Z792 Long term (current) use of antibiotics: Secondary | ICD-10-CM | POA: Diagnosis not present

## 2019-08-22 DIAGNOSIS — Z471 Aftercare following joint replacement surgery: Secondary | ICD-10-CM | POA: Diagnosis not present

## 2019-08-22 DIAGNOSIS — I10 Essential (primary) hypertension: Secondary | ICD-10-CM | POA: Diagnosis not present

## 2019-08-22 DIAGNOSIS — Z96651 Presence of right artificial knee joint: Secondary | ICD-10-CM | POA: Diagnosis not present

## 2019-08-22 DIAGNOSIS — E119 Type 2 diabetes mellitus without complications: Secondary | ICD-10-CM | POA: Diagnosis not present

## 2019-08-23 DIAGNOSIS — Z792 Long term (current) use of antibiotics: Secondary | ICD-10-CM | POA: Diagnosis not present

## 2019-08-23 DIAGNOSIS — I10 Essential (primary) hypertension: Secondary | ICD-10-CM | POA: Diagnosis not present

## 2019-08-23 DIAGNOSIS — Z7984 Long term (current) use of oral hypoglycemic drugs: Secondary | ICD-10-CM | POA: Diagnosis not present

## 2019-08-23 DIAGNOSIS — Z96651 Presence of right artificial knee joint: Secondary | ICD-10-CM | POA: Diagnosis not present

## 2019-08-23 DIAGNOSIS — E119 Type 2 diabetes mellitus without complications: Secondary | ICD-10-CM | POA: Diagnosis not present

## 2019-08-23 DIAGNOSIS — Z48815 Encounter for surgical aftercare following surgery on the digestive system: Secondary | ICD-10-CM | POA: Diagnosis not present

## 2019-08-23 DIAGNOSIS — Z7982 Long term (current) use of aspirin: Secondary | ICD-10-CM | POA: Diagnosis not present

## 2019-08-23 DIAGNOSIS — Z471 Aftercare following joint replacement surgery: Secondary | ICD-10-CM | POA: Diagnosis not present

## 2019-08-23 DIAGNOSIS — Z79899 Other long term (current) drug therapy: Secondary | ICD-10-CM | POA: Diagnosis not present

## 2019-08-24 DIAGNOSIS — Z792 Long term (current) use of antibiotics: Secondary | ICD-10-CM | POA: Diagnosis not present

## 2019-08-24 DIAGNOSIS — Z79899 Other long term (current) drug therapy: Secondary | ICD-10-CM | POA: Diagnosis not present

## 2019-08-24 DIAGNOSIS — E119 Type 2 diabetes mellitus without complications: Secondary | ICD-10-CM | POA: Diagnosis not present

## 2019-08-24 DIAGNOSIS — I10 Essential (primary) hypertension: Secondary | ICD-10-CM | POA: Diagnosis not present

## 2019-08-24 DIAGNOSIS — Z96651 Presence of right artificial knee joint: Secondary | ICD-10-CM | POA: Diagnosis not present

## 2019-08-24 DIAGNOSIS — Z48815 Encounter for surgical aftercare following surgery on the digestive system: Secondary | ICD-10-CM | POA: Diagnosis not present

## 2019-08-24 DIAGNOSIS — Z7982 Long term (current) use of aspirin: Secondary | ICD-10-CM | POA: Diagnosis not present

## 2019-08-24 DIAGNOSIS — Z471 Aftercare following joint replacement surgery: Secondary | ICD-10-CM | POA: Diagnosis not present

## 2019-08-24 DIAGNOSIS — Z7984 Long term (current) use of oral hypoglycemic drugs: Secondary | ICD-10-CM | POA: Diagnosis not present

## 2019-08-28 DIAGNOSIS — Z7982 Long term (current) use of aspirin: Secondary | ICD-10-CM | POA: Diagnosis not present

## 2019-08-28 DIAGNOSIS — Z48815 Encounter for surgical aftercare following surgery on the digestive system: Secondary | ICD-10-CM | POA: Diagnosis not present

## 2019-08-28 DIAGNOSIS — E119 Type 2 diabetes mellitus without complications: Secondary | ICD-10-CM | POA: Diagnosis not present

## 2019-08-28 DIAGNOSIS — Z7984 Long term (current) use of oral hypoglycemic drugs: Secondary | ICD-10-CM | POA: Diagnosis not present

## 2019-08-28 DIAGNOSIS — Z792 Long term (current) use of antibiotics: Secondary | ICD-10-CM | POA: Diagnosis not present

## 2019-08-28 DIAGNOSIS — Z96651 Presence of right artificial knee joint: Secondary | ICD-10-CM | POA: Diagnosis not present

## 2019-08-28 DIAGNOSIS — Z471 Aftercare following joint replacement surgery: Secondary | ICD-10-CM | POA: Diagnosis not present

## 2019-08-28 DIAGNOSIS — Z79899 Other long term (current) drug therapy: Secondary | ICD-10-CM | POA: Diagnosis not present

## 2019-08-28 DIAGNOSIS — I10 Essential (primary) hypertension: Secondary | ICD-10-CM | POA: Diagnosis not present

## 2019-08-29 DIAGNOSIS — Z7984 Long term (current) use of oral hypoglycemic drugs: Secondary | ICD-10-CM | POA: Diagnosis not present

## 2019-08-29 DIAGNOSIS — Z79899 Other long term (current) drug therapy: Secondary | ICD-10-CM | POA: Diagnosis not present

## 2019-08-29 DIAGNOSIS — E119 Type 2 diabetes mellitus without complications: Secondary | ICD-10-CM | POA: Diagnosis not present

## 2019-08-29 DIAGNOSIS — Z7982 Long term (current) use of aspirin: Secondary | ICD-10-CM | POA: Diagnosis not present

## 2019-08-29 DIAGNOSIS — Z96651 Presence of right artificial knee joint: Secondary | ICD-10-CM | POA: Diagnosis not present

## 2019-08-29 DIAGNOSIS — Z48815 Encounter for surgical aftercare following surgery on the digestive system: Secondary | ICD-10-CM | POA: Diagnosis not present

## 2019-08-29 DIAGNOSIS — Z792 Long term (current) use of antibiotics: Secondary | ICD-10-CM | POA: Diagnosis not present

## 2019-08-29 DIAGNOSIS — I10 Essential (primary) hypertension: Secondary | ICD-10-CM | POA: Diagnosis not present

## 2019-08-29 DIAGNOSIS — Z471 Aftercare following joint replacement surgery: Secondary | ICD-10-CM | POA: Diagnosis not present

## 2019-08-30 DIAGNOSIS — I10 Essential (primary) hypertension: Secondary | ICD-10-CM | POA: Diagnosis not present

## 2019-08-30 DIAGNOSIS — Z792 Long term (current) use of antibiotics: Secondary | ICD-10-CM | POA: Diagnosis not present

## 2019-08-30 DIAGNOSIS — E119 Type 2 diabetes mellitus without complications: Secondary | ICD-10-CM | POA: Diagnosis not present

## 2019-08-30 DIAGNOSIS — Z471 Aftercare following joint replacement surgery: Secondary | ICD-10-CM | POA: Diagnosis not present

## 2019-08-30 DIAGNOSIS — Z7984 Long term (current) use of oral hypoglycemic drugs: Secondary | ICD-10-CM | POA: Diagnosis not present

## 2019-08-30 DIAGNOSIS — Z96651 Presence of right artificial knee joint: Secondary | ICD-10-CM | POA: Diagnosis not present

## 2019-08-30 DIAGNOSIS — Z48815 Encounter for surgical aftercare following surgery on the digestive system: Secondary | ICD-10-CM | POA: Diagnosis not present

## 2019-08-30 DIAGNOSIS — Z79899 Other long term (current) drug therapy: Secondary | ICD-10-CM | POA: Diagnosis not present

## 2019-08-30 DIAGNOSIS — Z7982 Long term (current) use of aspirin: Secondary | ICD-10-CM | POA: Diagnosis not present

## 2019-08-31 DIAGNOSIS — E1142 Type 2 diabetes mellitus with diabetic polyneuropathy: Secondary | ICD-10-CM | POA: Diagnosis not present

## 2019-08-31 DIAGNOSIS — I10 Essential (primary) hypertension: Secondary | ICD-10-CM | POA: Diagnosis not present

## 2019-08-31 DIAGNOSIS — E1169 Type 2 diabetes mellitus with other specified complication: Secondary | ICD-10-CM | POA: Diagnosis not present

## 2019-08-31 DIAGNOSIS — M1710 Unilateral primary osteoarthritis, unspecified knee: Secondary | ICD-10-CM | POA: Diagnosis not present

## 2019-08-31 DIAGNOSIS — E785 Hyperlipidemia, unspecified: Secondary | ICD-10-CM | POA: Diagnosis not present

## 2019-09-04 DIAGNOSIS — Z7982 Long term (current) use of aspirin: Secondary | ICD-10-CM | POA: Diagnosis not present

## 2019-09-04 DIAGNOSIS — Z48815 Encounter for surgical aftercare following surgery on the digestive system: Secondary | ICD-10-CM | POA: Diagnosis not present

## 2019-09-04 DIAGNOSIS — E119 Type 2 diabetes mellitus without complications: Secondary | ICD-10-CM | POA: Diagnosis not present

## 2019-09-04 DIAGNOSIS — Z471 Aftercare following joint replacement surgery: Secondary | ICD-10-CM | POA: Diagnosis not present

## 2019-09-04 DIAGNOSIS — Z7984 Long term (current) use of oral hypoglycemic drugs: Secondary | ICD-10-CM | POA: Diagnosis not present

## 2019-09-04 DIAGNOSIS — I10 Essential (primary) hypertension: Secondary | ICD-10-CM | POA: Diagnosis not present

## 2019-09-04 DIAGNOSIS — Z96651 Presence of right artificial knee joint: Secondary | ICD-10-CM | POA: Diagnosis not present

## 2019-09-04 DIAGNOSIS — Z79899 Other long term (current) drug therapy: Secondary | ICD-10-CM | POA: Diagnosis not present

## 2019-09-04 DIAGNOSIS — Z792 Long term (current) use of antibiotics: Secondary | ICD-10-CM | POA: Diagnosis not present

## 2019-09-11 DIAGNOSIS — Z7984 Long term (current) use of oral hypoglycemic drugs: Secondary | ICD-10-CM | POA: Diagnosis not present

## 2019-09-11 DIAGNOSIS — Z79899 Other long term (current) drug therapy: Secondary | ICD-10-CM | POA: Diagnosis not present

## 2019-09-11 DIAGNOSIS — Z792 Long term (current) use of antibiotics: Secondary | ICD-10-CM | POA: Diagnosis not present

## 2019-09-11 DIAGNOSIS — I10 Essential (primary) hypertension: Secondary | ICD-10-CM | POA: Diagnosis not present

## 2019-09-11 DIAGNOSIS — Z7982 Long term (current) use of aspirin: Secondary | ICD-10-CM | POA: Diagnosis not present

## 2019-09-11 DIAGNOSIS — Z471 Aftercare following joint replacement surgery: Secondary | ICD-10-CM | POA: Diagnosis not present

## 2019-09-11 DIAGNOSIS — Z48815 Encounter for surgical aftercare following surgery on the digestive system: Secondary | ICD-10-CM | POA: Diagnosis not present

## 2019-09-11 DIAGNOSIS — Z96651 Presence of right artificial knee joint: Secondary | ICD-10-CM | POA: Diagnosis not present

## 2019-09-11 DIAGNOSIS — E119 Type 2 diabetes mellitus without complications: Secondary | ICD-10-CM | POA: Diagnosis not present

## 2019-09-12 DIAGNOSIS — Z79899 Other long term (current) drug therapy: Secondary | ICD-10-CM | POA: Diagnosis not present

## 2019-09-12 DIAGNOSIS — Z471 Aftercare following joint replacement surgery: Secondary | ICD-10-CM | POA: Diagnosis not present

## 2019-09-12 DIAGNOSIS — Z7982 Long term (current) use of aspirin: Secondary | ICD-10-CM | POA: Diagnosis not present

## 2019-09-12 DIAGNOSIS — Z792 Long term (current) use of antibiotics: Secondary | ICD-10-CM | POA: Diagnosis not present

## 2019-09-12 DIAGNOSIS — Z7984 Long term (current) use of oral hypoglycemic drugs: Secondary | ICD-10-CM | POA: Diagnosis not present

## 2019-09-12 DIAGNOSIS — Z48815 Encounter for surgical aftercare following surgery on the digestive system: Secondary | ICD-10-CM | POA: Diagnosis not present

## 2019-09-12 DIAGNOSIS — I10 Essential (primary) hypertension: Secondary | ICD-10-CM | POA: Diagnosis not present

## 2019-09-12 DIAGNOSIS — E119 Type 2 diabetes mellitus without complications: Secondary | ICD-10-CM | POA: Diagnosis not present

## 2019-09-12 DIAGNOSIS — Z96651 Presence of right artificial knee joint: Secondary | ICD-10-CM | POA: Diagnosis not present

## 2019-09-13 DIAGNOSIS — Z48815 Encounter for surgical aftercare following surgery on the digestive system: Secondary | ICD-10-CM | POA: Diagnosis not present

## 2019-09-13 DIAGNOSIS — Z7984 Long term (current) use of oral hypoglycemic drugs: Secondary | ICD-10-CM | POA: Diagnosis not present

## 2019-09-13 DIAGNOSIS — Z471 Aftercare following joint replacement surgery: Secondary | ICD-10-CM | POA: Diagnosis not present

## 2019-09-13 DIAGNOSIS — E119 Type 2 diabetes mellitus without complications: Secondary | ICD-10-CM | POA: Diagnosis not present

## 2019-09-13 DIAGNOSIS — I10 Essential (primary) hypertension: Secondary | ICD-10-CM | POA: Diagnosis not present

## 2019-09-13 DIAGNOSIS — Z79899 Other long term (current) drug therapy: Secondary | ICD-10-CM | POA: Diagnosis not present

## 2019-09-13 DIAGNOSIS — Z792 Long term (current) use of antibiotics: Secondary | ICD-10-CM | POA: Diagnosis not present

## 2019-09-13 DIAGNOSIS — Z96651 Presence of right artificial knee joint: Secondary | ICD-10-CM | POA: Diagnosis not present

## 2019-09-13 DIAGNOSIS — Z7982 Long term (current) use of aspirin: Secondary | ICD-10-CM | POA: Diagnosis not present

## 2019-09-19 DIAGNOSIS — Z471 Aftercare following joint replacement surgery: Secondary | ICD-10-CM | POA: Diagnosis not present

## 2019-09-19 DIAGNOSIS — Z96651 Presence of right artificial knee joint: Secondary | ICD-10-CM | POA: Diagnosis not present

## 2019-09-20 DIAGNOSIS — Z471 Aftercare following joint replacement surgery: Secondary | ICD-10-CM | POA: Diagnosis not present

## 2019-10-03 DIAGNOSIS — M1710 Unilateral primary osteoarthritis, unspecified knee: Secondary | ICD-10-CM | POA: Diagnosis not present

## 2019-10-03 DIAGNOSIS — E1142 Type 2 diabetes mellitus with diabetic polyneuropathy: Secondary | ICD-10-CM | POA: Diagnosis not present

## 2019-10-03 DIAGNOSIS — E1169 Type 2 diabetes mellitus with other specified complication: Secondary | ICD-10-CM | POA: Diagnosis not present

## 2019-10-03 DIAGNOSIS — I10 Essential (primary) hypertension: Secondary | ICD-10-CM | POA: Diagnosis not present

## 2019-10-03 DIAGNOSIS — E785 Hyperlipidemia, unspecified: Secondary | ICD-10-CM | POA: Diagnosis not present

## 2019-10-08 ENCOUNTER — Other Ambulatory Visit: Payer: Self-pay

## 2019-10-08 DIAGNOSIS — Z20822 Contact with and (suspected) exposure to covid-19: Secondary | ICD-10-CM

## 2019-10-09 LAB — NOVEL CORONAVIRUS, NAA: SARS-CoV-2, NAA: DETECTED — AB

## 2019-10-09 LAB — SARS-COV-2, NAA 2 DAY TAT

## 2019-10-10 ENCOUNTER — Telehealth: Payer: Self-pay | Admitting: Infectious Diseases

## 2019-10-10 NOTE — Telephone Encounter (Signed)
Called to Discuss with patient about Covid symptoms and the use of the monoclonal antibody infusion for those with mild to moderate Covid symptoms and at a high risk of hospitalization.     Pt appears to qualify for this infusion due to co-morbid conditions and/or a member of an at-risk group in accordance with the FDA Emergency Use Authorization.    She is feeling very well. Slight cough on occasion. Her son has infection also. Everyone is vaccinated.   She is diabetic. Symptoms started on Sunday. She has our information should she decide to schedule.

## 2019-10-11 ENCOUNTER — Other Ambulatory Visit: Payer: Self-pay | Admitting: Nurse Practitioner

## 2019-10-11 ENCOUNTER — Ambulatory Visit (HOSPITAL_COMMUNITY)
Admission: RE | Admit: 2019-10-11 | Discharge: 2019-10-11 | Disposition: A | Discharge status: Home / Self Care | Payer: Medicare Other | Source: Ambulatory Visit | Attending: Pulmonary Disease | Admitting: Pulmonary Disease

## 2019-10-11 DIAGNOSIS — Z23 Encounter for immunization: Secondary | ICD-10-CM | POA: Diagnosis not present

## 2019-10-11 DIAGNOSIS — U071 COVID-19: Secondary | ICD-10-CM

## 2019-10-11 MED ORDER — DIPHENHYDRAMINE HCL 50 MG/ML IJ SOLN: 50.0000 mg | Freq: Once | INTRAMUSCULAR | Status: DC | PRN

## 2019-10-11 MED ORDER — METHYLPREDNISOLONE SODIUM SUCC 125 MG IJ SOLR: 125.0000 mg | Freq: Once | INTRAMUSCULAR | Status: DC | PRN

## 2019-10-11 MED ORDER — SODIUM CHLORIDE 0.9 % IV SOLN
1200.0000 mg | Freq: Once | INTRAVENOUS | Status: AC
  Administered 2019-10-11: 1200 mg via INTRAVENOUS

## 2019-10-11 MED ORDER — SODIUM CHLORIDE 0.9 % IV SOLN: INTRAVENOUS | Status: DC | PRN

## 2019-10-11 MED ORDER — EPINEPHRINE 0.3 MG/0.3ML IJ SOAJ: 0.3000 mg | Freq: Once | INTRAMUSCULAR | Status: DC | PRN

## 2019-10-11 MED ORDER — FAMOTIDINE IN NACL 20-0.9 MG/50ML-% IV SOLN: 20.0000 mg | Freq: Once | INTRAVENOUS | Status: DC | PRN

## 2019-10-11 MED ORDER — ALBUTEROL SULFATE HFA 108 (90 BASE) MCG/ACT IN AERS: 2.0000 | INHALATION_SPRAY | Freq: Once | RESPIRATORY_TRACT | Status: DC | PRN

## 2019-10-11 NOTE — Progress Notes (Signed)
I connected by phone with Tammy Sours on 10/11/2019 at 7:53 AM to discuss the potential use of an new treatment for mild to moderate COVID-19 viral infection in non-hospitalized patients.  This patient is a 74 y.o. female that meets the FDA criteria for Emergency Use Authorization of casirivimab\imdevimab.  Has a (+) direct SARS-CoV-2 viral test result  Has mild or moderate COVID-19   Is ? 74 years of age and weighs ? 40 kg  Is NOT hospitalized due to COVID-19  Is NOT requiring oxygen therapy or requiring an increase in baseline oxygen flow rate due to COVID-19  Is within 10 days of symptom onset  Has at least one of the high risk factor(s) for progression to severe COVID-19 and/or hospitalization as defined in EUA.  Specific high risk criteria : Older age (>/= 74 yo), BMI > 25 and Diabetes   Patient says she was tested at [facility] site and was called and told she was positive for covid. Result not available in her chart.    I have spoken and communicated the following to the patient or parent/caregiver:  1. FDA has authorized the emergency use of casirivimab\imdevimab for the treatment of mild to moderate COVID-19 in adults and pediatric patients with positive results of direct SARS-CoV-2 viral testing who are 57 years of age and older weighing at least 40 kg, and who are at high risk for progressing to severe COVID-19 and/or hospitalization.  2. The significant known and potential risks and benefits of casirivimab\imdevimab, and the extent to which such potential risks and benefits are unknown.  3. Information on available alternative treatments and the risks and benefits of those alternatives, including clinical trials.  4. Patients treated with casirivimab\imdevimab should continue to self-isolate and use infection control measures (e.g., wear mask, isolate, social distance, avoid sharing personal items, clean and disinfect "high touch" surfaces, and frequent handwashing)  according to CDC guidelines.   5. The patient or parent/caregiver has the option to accept or refuse casirivimab\imdevimab .  After reviewing this information with the patient, the patient has agreed to receive one of the available covid 19 monoclonal antibodies and will be provided an appropriate fact sheet prior to infusion.Beckey Rutter, [place], AGNP-C [phone] ([place])

## 2019-10-11 NOTE — Progress Notes (Signed)
  Diagnosis: COVID-19  Physician: Dr. Delfino Lovett  Procedure: Covid Infusion Clinic Med: casirivimab\imdevimab infusion - Provided patient with casirivimab\imdevimab fact sheet for patients, parents and caregivers prior to infusion.  Complications: No immediate complications noted.  Discharge: Discharged home   Shannon Moore 10/11/2019

## 2019-10-11 NOTE — Discharge Instructions (Signed)

## 2019-11-12 DIAGNOSIS — E1169 Type 2 diabetes mellitus with other specified complication: Secondary | ICD-10-CM | POA: Diagnosis not present

## 2019-11-12 DIAGNOSIS — Z8619 Personal history of other infectious and parasitic diseases: Secondary | ICD-10-CM | POA: Diagnosis not present

## 2019-11-12 DIAGNOSIS — Z8719 Personal history of other diseases of the digestive system: Secondary | ICD-10-CM | POA: Diagnosis not present

## 2019-11-12 DIAGNOSIS — I10 Essential (primary) hypertension: Secondary | ICD-10-CM | POA: Diagnosis not present

## 2019-11-12 DIAGNOSIS — R110 Nausea: Secondary | ICD-10-CM | POA: Diagnosis not present

## 2019-11-12 DIAGNOSIS — R197 Diarrhea, unspecified: Secondary | ICD-10-CM | POA: Diagnosis not present

## 2019-11-12 DIAGNOSIS — E785 Hyperlipidemia, unspecified: Secondary | ICD-10-CM | POA: Diagnosis not present

## 2019-11-14 DIAGNOSIS — R197 Diarrhea, unspecified: Secondary | ICD-10-CM | POA: Diagnosis not present

## 2019-11-14 DIAGNOSIS — Z86010 Personal history of colonic polyps: Secondary | ICD-10-CM | POA: Diagnosis not present

## 2019-11-14 DIAGNOSIS — K5730 Diverticulosis of large intestine without perforation or abscess without bleeding: Secondary | ICD-10-CM | POA: Diagnosis not present

## 2019-11-27 ENCOUNTER — Ambulatory Visit: Payer: Self-pay | Admitting: Surgery

## 2019-11-29 DIAGNOSIS — Z1231 Encounter for screening mammogram for malignant neoplasm of breast: Secondary | ICD-10-CM | POA: Diagnosis not present

## 2019-12-03 DIAGNOSIS — K631 Perforation of intestine (nontraumatic): Secondary | ICD-10-CM | POA: Diagnosis not present

## 2019-12-12 DIAGNOSIS — R928 Other abnormal and inconclusive findings on diagnostic imaging of breast: Secondary | ICD-10-CM | POA: Diagnosis not present

## 2019-12-26 DIAGNOSIS — N6312 Unspecified lump in the right breast, upper inner quadrant: Secondary | ICD-10-CM | POA: Diagnosis not present

## 2019-12-26 DIAGNOSIS — R921 Mammographic calcification found on diagnostic imaging of breast: Secondary | ICD-10-CM | POA: Diagnosis not present

## 2019-12-31 DIAGNOSIS — E1142 Type 2 diabetes mellitus with diabetic polyneuropathy: Secondary | ICD-10-CM | POA: Diagnosis not present

## 2019-12-31 DIAGNOSIS — M1710 Unilateral primary osteoarthritis, unspecified knee: Secondary | ICD-10-CM | POA: Diagnosis not present

## 2019-12-31 DIAGNOSIS — I10 Essential (primary) hypertension: Secondary | ICD-10-CM | POA: Diagnosis not present

## 2019-12-31 DIAGNOSIS — E1169 Type 2 diabetes mellitus with other specified complication: Secondary | ICD-10-CM | POA: Diagnosis not present

## 2019-12-31 DIAGNOSIS — E785 Hyperlipidemia, unspecified: Secondary | ICD-10-CM | POA: Diagnosis not present

## 2020-03-04 DIAGNOSIS — L820 Inflamed seborrheic keratosis: Secondary | ICD-10-CM | POA: Diagnosis not present

## 2020-03-04 DIAGNOSIS — L308 Other specified dermatitis: Secondary | ICD-10-CM | POA: Diagnosis not present

## 2020-04-04 DIAGNOSIS — Z86010 Personal history of colonic polyps: Secondary | ICD-10-CM | POA: Diagnosis not present

## 2020-04-04 DIAGNOSIS — J302 Other seasonal allergic rhinitis: Secondary | ICD-10-CM | POA: Diagnosis not present

## 2020-04-04 DIAGNOSIS — E785 Hyperlipidemia, unspecified: Secondary | ICD-10-CM | POA: Diagnosis not present

## 2020-04-04 DIAGNOSIS — I10 Essential (primary) hypertension: Secondary | ICD-10-CM | POA: Diagnosis not present

## 2020-04-04 DIAGNOSIS — M109 Gout, unspecified: Secondary | ICD-10-CM | POA: Diagnosis not present

## 2020-04-04 DIAGNOSIS — I519 Heart disease, unspecified: Secondary | ICD-10-CM | POA: Diagnosis not present

## 2020-04-04 DIAGNOSIS — E1169 Type 2 diabetes mellitus with other specified complication: Secondary | ICD-10-CM | POA: Diagnosis not present

## 2020-04-04 DIAGNOSIS — Z8719 Personal history of other diseases of the digestive system: Secondary | ICD-10-CM | POA: Diagnosis not present

## 2020-04-04 DIAGNOSIS — Z0000 Encounter for general adult medical examination without abnormal findings: Secondary | ICD-10-CM | POA: Diagnosis not present

## 2020-04-22 ENCOUNTER — Other Ambulatory Visit: Payer: Self-pay | Admitting: Physician Assistant

## 2020-04-22 DIAGNOSIS — Z86010 Personal history of colonic polyps: Secondary | ICD-10-CM | POA: Diagnosis not present

## 2020-04-22 DIAGNOSIS — E1169 Type 2 diabetes mellitus with other specified complication: Secondary | ICD-10-CM | POA: Diagnosis not present

## 2020-04-22 DIAGNOSIS — R1310 Dysphagia, unspecified: Secondary | ICD-10-CM

## 2020-04-22 DIAGNOSIS — K5730 Diverticulosis of large intestine without perforation or abscess without bleeding: Secondary | ICD-10-CM | POA: Diagnosis not present

## 2020-04-22 DIAGNOSIS — Z7984 Long term (current) use of oral hypoglycemic drugs: Secondary | ICD-10-CM | POA: Diagnosis not present

## 2020-05-07 DIAGNOSIS — Z961 Presence of intraocular lens: Secondary | ICD-10-CM | POA: Diagnosis not present

## 2020-05-07 DIAGNOSIS — E119 Type 2 diabetes mellitus without complications: Secondary | ICD-10-CM | POA: Diagnosis not present

## 2020-05-07 DIAGNOSIS — H52203 Unspecified astigmatism, bilateral: Secondary | ICD-10-CM | POA: Diagnosis not present

## 2020-05-07 DIAGNOSIS — H40013 Open angle with borderline findings, low risk, bilateral: Secondary | ICD-10-CM | POA: Diagnosis not present

## 2020-05-09 DIAGNOSIS — L821 Other seborrheic keratosis: Secondary | ICD-10-CM | POA: Diagnosis not present

## 2020-05-09 DIAGNOSIS — D485 Neoplasm of uncertain behavior of skin: Secondary | ICD-10-CM | POA: Diagnosis not present

## 2020-05-09 DIAGNOSIS — L814 Other melanin hyperpigmentation: Secondary | ICD-10-CM | POA: Diagnosis not present

## 2020-05-09 DIAGNOSIS — D225 Melanocytic nevi of trunk: Secondary | ICD-10-CM | POA: Diagnosis not present

## 2020-05-09 DIAGNOSIS — L820 Inflamed seborrheic keratosis: Secondary | ICD-10-CM | POA: Diagnosis not present

## 2020-05-09 DIAGNOSIS — D1801 Hemangioma of skin and subcutaneous tissue: Secondary | ICD-10-CM | POA: Diagnosis not present

## 2020-05-09 DIAGNOSIS — L438 Other lichen planus: Secondary | ICD-10-CM | POA: Diagnosis not present

## 2020-05-09 DIAGNOSIS — L309 Dermatitis, unspecified: Secondary | ICD-10-CM | POA: Diagnosis not present

## 2020-05-13 ENCOUNTER — Other Ambulatory Visit: Payer: Self-pay | Admitting: Physician Assistant

## 2020-05-13 ENCOUNTER — Ambulatory Visit
Admission: RE | Admit: 2020-05-13 | Discharge: 2020-05-13 | Disposition: A | Discharge status: Home / Self Care | Payer: Medicare HMO | Source: Ambulatory Visit | Attending: Physician Assistant | Admitting: Physician Assistant

## 2020-05-13 DIAGNOSIS — R1310 Dysphagia, unspecified: Secondary | ICD-10-CM

## 2020-05-13 DIAGNOSIS — K219 Gastro-esophageal reflux disease without esophagitis: Secondary | ICD-10-CM | POA: Diagnosis not present

## 2020-07-17 DIAGNOSIS — D122 Benign neoplasm of ascending colon: Secondary | ICD-10-CM | POA: Diagnosis not present

## 2020-07-17 DIAGNOSIS — R1310 Dysphagia, unspecified: Secondary | ICD-10-CM | POA: Diagnosis not present

## 2020-07-17 DIAGNOSIS — K5730 Diverticulosis of large intestine without perforation or abscess without bleeding: Secondary | ICD-10-CM | POA: Diagnosis not present

## 2020-07-17 DIAGNOSIS — K222 Esophageal obstruction: Secondary | ICD-10-CM | POA: Diagnosis not present

## 2020-07-17 DIAGNOSIS — K635 Polyp of colon: Secondary | ICD-10-CM | POA: Diagnosis not present

## 2020-07-17 DIAGNOSIS — Z86010 Personal history of colonic polyps: Secondary | ICD-10-CM | POA: Diagnosis not present

## 2020-07-17 DIAGNOSIS — K449 Diaphragmatic hernia without obstruction or gangrene: Secondary | ICD-10-CM | POA: Diagnosis not present

## 2020-07-22 DIAGNOSIS — D122 Benign neoplasm of ascending colon: Secondary | ICD-10-CM | POA: Diagnosis not present

## 2020-09-22 DIAGNOSIS — L810 Postinflammatory hyperpigmentation: Secondary | ICD-10-CM | POA: Diagnosis not present

## 2020-09-22 DIAGNOSIS — L298 Other pruritus: Secondary | ICD-10-CM | POA: Diagnosis not present

## 2020-09-22 DIAGNOSIS — L538 Other specified erythematous conditions: Secondary | ICD-10-CM | POA: Diagnosis not present

## 2020-09-22 DIAGNOSIS — L820 Inflamed seborrheic keratosis: Secondary | ICD-10-CM | POA: Diagnosis not present

## 2020-09-22 DIAGNOSIS — L814 Other melanin hyperpigmentation: Secondary | ICD-10-CM | POA: Diagnosis not present

## 2020-09-22 DIAGNOSIS — L821 Other seborrheic keratosis: Secondary | ICD-10-CM | POA: Diagnosis not present

## 2020-09-22 DIAGNOSIS — L400 Psoriasis vulgaris: Secondary | ICD-10-CM | POA: Diagnosis not present

## 2020-09-22 DIAGNOSIS — R208 Other disturbances of skin sensation: Secondary | ICD-10-CM | POA: Diagnosis not present

## 2020-09-22 DIAGNOSIS — L738 Other specified follicular disorders: Secondary | ICD-10-CM | POA: Diagnosis not present

## 2020-10-07 DIAGNOSIS — E1169 Type 2 diabetes mellitus with other specified complication: Secondary | ICD-10-CM | POA: Diagnosis not present

## 2020-10-07 DIAGNOSIS — M109 Gout, unspecified: Secondary | ICD-10-CM | POA: Diagnosis not present

## 2020-10-07 DIAGNOSIS — Z7984 Long term (current) use of oral hypoglycemic drugs: Secondary | ICD-10-CM | POA: Diagnosis not present

## 2020-10-07 DIAGNOSIS — R1030 Lower abdominal pain, unspecified: Secondary | ICD-10-CM | POA: Diagnosis not present

## 2020-10-07 DIAGNOSIS — I10 Essential (primary) hypertension: Secondary | ICD-10-CM | POA: Diagnosis not present

## 2020-10-07 DIAGNOSIS — E785 Hyperlipidemia, unspecified: Secondary | ICD-10-CM | POA: Diagnosis not present

## 2020-12-01 DIAGNOSIS — Z1231 Encounter for screening mammogram for malignant neoplasm of breast: Secondary | ICD-10-CM | POA: Diagnosis not present

## 2020-12-30 DIAGNOSIS — R051 Acute cough: Secondary | ICD-10-CM | POA: Diagnosis not present

## 2021-04-14 DIAGNOSIS — M109 Gout, unspecified: Secondary | ICD-10-CM | POA: Diagnosis not present

## 2021-04-14 DIAGNOSIS — N8110 Cystocele, unspecified: Secondary | ICD-10-CM | POA: Diagnosis not present

## 2021-04-14 DIAGNOSIS — N3946 Mixed incontinence: Secondary | ICD-10-CM | POA: Diagnosis not present

## 2021-04-14 DIAGNOSIS — Z0000 Encounter for general adult medical examination without abnormal findings: Secondary | ICD-10-CM | POA: Diagnosis not present

## 2021-04-14 DIAGNOSIS — J302 Other seasonal allergic rhinitis: Secondary | ICD-10-CM | POA: Diagnosis not present

## 2021-04-14 DIAGNOSIS — I10 Essential (primary) hypertension: Secondary | ICD-10-CM | POA: Diagnosis not present

## 2021-04-14 DIAGNOSIS — R197 Diarrhea, unspecified: Secondary | ICD-10-CM | POA: Diagnosis not present

## 2021-04-14 DIAGNOSIS — E1169 Type 2 diabetes mellitus with other specified complication: Secondary | ICD-10-CM | POA: Diagnosis not present

## 2021-04-14 DIAGNOSIS — E785 Hyperlipidemia, unspecified: Secondary | ICD-10-CM | POA: Diagnosis not present

## 2021-04-28 DIAGNOSIS — D225 Melanocytic nevi of trunk: Secondary | ICD-10-CM | POA: Diagnosis not present

## 2021-04-28 DIAGNOSIS — L821 Other seborrheic keratosis: Secondary | ICD-10-CM | POA: Diagnosis not present

## 2021-04-28 DIAGNOSIS — L304 Erythema intertrigo: Secondary | ICD-10-CM | POA: Diagnosis not present

## 2021-04-28 DIAGNOSIS — L408 Other psoriasis: Secondary | ICD-10-CM | POA: Diagnosis not present

## 2021-04-28 DIAGNOSIS — L570 Actinic keratosis: Secondary | ICD-10-CM | POA: Diagnosis not present

## 2021-04-28 DIAGNOSIS — L400 Psoriasis vulgaris: Secondary | ICD-10-CM | POA: Diagnosis not present

## 2021-04-28 DIAGNOSIS — R208 Other disturbances of skin sensation: Secondary | ICD-10-CM | POA: Diagnosis not present

## 2021-04-28 DIAGNOSIS — L814 Other melanin hyperpigmentation: Secondary | ICD-10-CM | POA: Diagnosis not present

## 2021-04-28 DIAGNOSIS — L820 Inflamed seborrheic keratosis: Secondary | ICD-10-CM | POA: Diagnosis not present

## 2021-05-09 NOTE — Progress Notes (Deleted)
?Cardiology Office Note:   ? ?Date:  05/09/2021  ? ?ID:  Shannon Moore, DOB 1945-11-04, MRN [id number] ? ?PCP:  Harlan Stains, MD ?  ?[facility] Providers ?Cardiologist:  None {}   ? ?Referring MD: Harlan Stains, MD  ? ? ?History of Present Illness:   ? ?Shannon Moore is a 76 y.o. female with a hx of HTN, GERD, HLD, and DMII who was referred by Dr. Dema Severin for further evaluation of  ? ?Past Medical History:  ?Diagnosis Date  ? Anginal pain ([place])   ? Arthritis   ? Hands, Knees hips, feet,   ? CHF (congestive heart failure) ([place]) 1990s  ? pt feels it is from Zoloft or a virus  ? Essential hypertension 01/08/2014  ? GERD (gastroesophageal reflux disease)   ? Goiter   ? Gout   ? Hyperlipidemia 01/08/2014  ? Obesity (BMI 30-39.9) 01/08/2014  ? Type 2 diabetes mellitus without complication ([place]) 08/65/7846  ? ? ?Past Surgical History:  ?Procedure Laterality Date  ? CARDIAC SURGERY    ? DIAGNOSTIC LAPAROSCOPY    ? EYE SURGERY    ? bilateral  RK 10 yrs ago  ? LAPAROTOMY N/A 08/12/2019  ? Procedure: EXPLORATORY LAPAROTOMY WITH MOBILIZATION OF ASCENDING AND DESCENDING COLON, MOBILIZATION OF DUODENUM, ABDOMINAL [place] OUT AND DRAIN PLACEMENT.;  Surgeon: Jesusita Oka, MD;  Location: [facility];  Service: General;  Laterality: N/A;  ? left and right knee arthroscopies- left 6 months ago    ? LEFT HEART CATHETERIZATION WITH CORONARY ANGIOGRAM N/A 01/10/2014  ? Procedure: LEFT HEART CATHETERIZATION WITH CORONARY ANGIOGRAM;  Surgeon: Jacolyn Reedy, MD;  Location: [facility] CATH LAB;  Service: Cardiovascular;  Laterality: N/A;  ? removal of bladder tumor    ? TOTAL KNEE ARTHROPLASTY  11/16/2010  ? Procedure: TOTAL KNEE ARTHROPLASTY;  Surgeon: Mauri Pole;  Location: [facility];  Service: Orthopedics;  Laterality: Left;  ? TOTAL KNEE ARTHROPLASTY Right 08/07/2019  ? Procedure: TOTAL KNEE ARTHROPLASTY;  Surgeon: Paralee Cancel, MD;  Location: [facility];  Service: Orthopedics;  Laterality: Right;  70 mins  ? TUBAL LIGATION    ? ? ?Current  Medications: ?No outpatient medications have been marked as taking for the 05/13/21 encounter (Appointment) with Freada Bergeron, MD.  ?  ? ?Allergies:   Atorvastatin, Sertraline, Sulfa antibiotics, Sulfasalazine, and Penicillins  ? ?Social History  ? ?Socioeconomic History  ? Marital status: Married  ?  Spouse name: Not on file  ? Number of children: Not on file  ? Years of education: Not on file  ? Highest education level: Not on file  ?Occupational History  ? Not on file  ?Tobacco Use  ? Smoking status: Former  ?  Packs/day: 1.00  ?  Years: 8.00  ?  Pack years: 8.00  ?  Types: Cigarettes  ?  Quit date: 11/10/1977  ?  Years since quitting: 43.5  ? Smokeless tobacco: Never  ?Vaping Use  ? Vaping Use: Never used  ?Substance and Sexual Activity  ? Alcohol use: Yes  ?  Alcohol/week: 1.0 standard drink  ?  Types: 1 Glasses of wine per week  ? Drug use: No  ? Sexual activity: Not on file  ?Other Topics Concern  ? Not on file  ?Social History Narrative  ? Not on file  ? ?Social Determinants of Health  ? ?Financial Resource Strain: Not on file  ?Food Insecurity: Not on file  ?Transportation Needs: Not on file  ?Physical Activity: Not on file  ?  Stress: Not on file  ?Social Connections: Not on file  ?  ? ?Family History: ?The patient's ***Family history is unknown by patient. ? ?ROS:   ?Please see the history of present illness.    ?*** All other systems reviewed and are negative. ? ?EKGs/Labs/Other Studies Reviewed:   ? ?The following studies were reviewed today: ?*** ? ?EKG:  EKG is *** ordered today.  The ekg ordered today demonstrates *** ? ?Recent Labs: ?No results found for requested labs within last 8760 hours.  ?Recent Lipid Panel ?No results found for: CHOL, TRIG, HDL, CHOLHDL, VLDL, LDLCALC, LDLDIRECT ? ? ?Risk Assessment/Calculations:   ?{Does this patient have ATRIAL FIBRILLATION?:[phone]} ? ?    ? ?Physical Exam:   ? ?VS:  There were no vitals taken for this visit.   ? ?Wt Readings from Last 3 Encounters:   ?08/12/19 165 lb (74.8 kg)  ?08/07/19 173 lb 15.1 oz (78.9 kg)  ?07/31/19 174 lb (78.9 kg)  ?  ? ?GEN: *** Well nourished, well developed in no acute distress ?HEENT: Normal ?NECK: No JVD; No carotid bruits ?LYMPHATICS: No lymphadenopathy ?CARDIAC: ***RRR, no murmurs, rubs, gallops ?RESPIRATORY:  Clear to auscultation without rales, wheezing or rhonchi  ?ABDOMEN: Soft, non-tender, non-distended ?MUSCULOSKELETAL:  No edema; No deformity  ?SKIN: Warm and dry ?NEUROLOGIC:  Alert and oriented x 3 ?PSYCHIATRIC:  Normal affect  ? ?ASSESSMENT:   ? ?No diagnosis found. ?PLAN:   ? ?In order of problems listed above: ? ?#HTN: ?-Continue olmesartan '40mg'$  daily ?-Continue metoprolol '25mg'$  daily ?-Continue HCTZ '25mg'$  daily ? ?#HLD: ?-Continue prava '80mg'$  daily ? ?#DMII: ?-Continue metformin ? ?   ? ?{Are you ordering a CV Procedure (e.g. stress test, cath, DCCV, TEE, etc)?   Press F2        :[id number]}  ? ? ?Medication Adjustments/Labs and Tests Ordered: ?Current medicines are reviewed at length with the patient today.  Concerns regarding medicines are outlined above.  ?No orders of the defined types were placed in this encounter. ? ?No orders of the defined types were placed in this encounter. ? ? ?There are no Patient Instructions on file for this visit.  ? ?Signed, ?Freada Bergeron, MD  ?05/09/2021 3:16 PM    ?[place] ?

## 2021-05-12 DIAGNOSIS — H26493 Other secondary cataract, bilateral: Secondary | ICD-10-CM | POA: Diagnosis not present

## 2021-05-12 DIAGNOSIS — H40013 Open angle with borderline findings, low risk, bilateral: Secondary | ICD-10-CM | POA: Diagnosis not present

## 2021-05-12 DIAGNOSIS — H52203 Unspecified astigmatism, bilateral: Secondary | ICD-10-CM | POA: Diagnosis not present

## 2021-05-12 DIAGNOSIS — E119 Type 2 diabetes mellitus without complications: Secondary | ICD-10-CM | POA: Diagnosis not present

## 2021-05-13 ENCOUNTER — Ambulatory Visit: Payer: Medicare HMO | Admitting: Cardiology

## 2021-05-13 ENCOUNTER — Encounter: Payer: Self-pay | Admitting: Cardiology

## 2021-05-13 VITALS — BP 136/76 | HR 64 | Ht 61.0 in | Wt 172.0 lb

## 2021-05-13 DIAGNOSIS — I25119 Atherosclerotic heart disease of native coronary artery with unspecified angina pectoris: Secondary | ICD-10-CM

## 2021-05-13 DIAGNOSIS — I509 Heart failure, unspecified: Secondary | ICD-10-CM | POA: Diagnosis not present

## 2021-05-13 DIAGNOSIS — E119 Type 2 diabetes mellitus without complications: Secondary | ICD-10-CM

## 2021-05-13 DIAGNOSIS — R072 Precordial pain: Secondary | ICD-10-CM | POA: Diagnosis not present

## 2021-05-13 DIAGNOSIS — I10 Essential (primary) hypertension: Secondary | ICD-10-CM | POA: Diagnosis not present

## 2021-05-13 DIAGNOSIS — Z79899 Other long term (current) drug therapy: Secondary | ICD-10-CM | POA: Diagnosis not present

## 2021-05-13 DIAGNOSIS — E782 Mixed hyperlipidemia: Secondary | ICD-10-CM

## 2021-05-13 MED ORDER — ROSUVASTATIN CALCIUM 20 MG PO TABS: 20.0000 mg | ORAL_TABLET | Freq: Every day | ORAL | 3 refills | Status: DC

## 2021-05-13 NOTE — Progress Notes (Signed)
?Cardiology Office Note:   ? ?Date:  05/13/2021  ? ?ID:  Shannon Moore, DOB 1945-02-01, MRN [id number] ? ?PCP:  Harlan Stains, MD ?  ?[facility] Providers ?Cardiologist:  None    ? ?Referring MD: Harlan Stains, MD  ? ? ?History of Present Illness:   ? ?Shannon Moore is a 76 y.o. female with a hx of HTN, GERD, HLD, and DMII who was referred by Dr. Dema Severin for further evaluation of palpitations and history of heart failure. ? ?Referral notes from Dr. Dema Severin reviewed. She was noted to have acute diastolic heart failure in the past. She was referred to cardiology for evaluation of palpitations. ? ?Per review of the record, the patient was previously followed by Dr. Wynonia Lawman. In 1998 she had congestive heart failure. At that time she had chest pain, diaphoresis, and shortness of breath. This worsened progressively over a week. She reportedly underwent cath at that time that did not show any obstructive disease. Ultimately, it was deemed that her symptoms were either related to a viral infection or related to Zoloft which she recently started for migraines. Her last cath per our records was in 2015 which showed mild disease. EF 60% at that time. TTEs not available to review.  ? ?Today, the patient states that she has not experienced any palpitations in the past few days. Her palpitations are very sporadic. It is difficult for her to predict their frequency. ? ?She endorses shortness of breath sometimes, usually while walking up a hill or slight incline. This has worsened over time, but she also attributes this to gaining some weight. At baseline she can walk on flat ground without any anginal symptoms.  ? ?One night after she went to bed she noticed some localized, non radiating central chest pain. This has not recurred. ? ?Also she complains of some LE edema. Currently on HCTZ which helps symptoms.  ? ?At home her blood pressures have been stable in the 726'O systolic. She notes going through "a stage" of low blood  pressures, but this resolved. ? ?She denies any lightheadedness, headaches, syncope, orthopnea, or PND. ? ?In her family, her mother had CHF. Her father had hypertension. Her sister has MS. She also believes her grandparents had heart disease. ? ?Past Medical History:  ?Diagnosis Date  ? Abnormal mammogram   ? Adenomatous polyp of colon   ? Anginal pain ([place])   ? Arthritis   ? Hands, Knees hips, feet,   ? Bell's palsy   ? CHF (congestive heart failure) ([place]) 1990s  ? pt feels it is from Zoloft or a virus  ? Diabetic peripheral neuropathy ([place])   ? Diastolic dysfunction   ? Diverticulitis   ? DM (diabetes mellitus) ([place])   ? Dysphagia   ? Esophageal obstruction   ? Essential hypertension 01/08/2014  ? Female cystocele   ? GERD (gastroesophageal reflux disease)   ? Goiter   ? Gout   ? History of 2019 novel coronavirus disease (COVID-19)   ? HTN (hypertension)   ? Hyperlipidemia 01/08/2014  ? Migraines   ? Mixed incontinence urge and stress   ? Obesity (BMI 30-39.9) 01/08/2014  ? Osteoarthritis of knee   ? Thyroid nodule   ? Type 2 diabetes mellitus without complication ([place]) 03/55/9741  ? ? ?Past Surgical History:  ?Procedure Laterality Date  ? CARDIAC SURGERY    ? DIAGNOSTIC LAPAROSCOPY    ? EYE SURGERY    ? bilateral  RK 10 yrs ago  ? LAPAROTOMY  N/A 08/12/2019  ? Procedure: EXPLORATORY LAPAROTOMY WITH MOBILIZATION OF ASCENDING AND DESCENDING COLON, MOBILIZATION OF DUODENUM, ABDOMINAL [place] OUT AND DRAIN PLACEMENT.;  Surgeon: Jesusita Oka, MD;  Location: [facility];  Service: General;  Laterality: N/A;  ? left and right knee arthroscopies- left 6 months ago    ? LEFT HEART CATHETERIZATION WITH CORONARY ANGIOGRAM N/A 01/10/2014  ? Procedure: LEFT HEART CATHETERIZATION WITH CORONARY ANGIOGRAM;  Surgeon: Jacolyn Reedy, MD;  Location[facility] CATH LAB;  Service: Cardiovascular;  Laterality: N/A;  ? removal of bladder tumor    ? TOTAL KNEE ARTHROPLASTY  11/16/2010  ? Procedure: TOTAL KNEE ARTHROPLASTY;  Surgeon: Mauri Pole;   [facility];  Service: Orthopedics;  Laterality: Left;  ? TOTAL KNEE ARTHROPLASTY Right 08/07/2019  ? Procedure: TOTAL KNEE ARTHROPLASTY;  Surgeon: Paralee Cancel, MD;  Location: [facility];  Service: Orthopedics;  Laterality: Right;  70 mins  ? TUBAL LIGATION    ? ? ?Current Medications: ?Current Meds  ?Medication Sig  ? allopurinol (ZYLOPRIM) 300 MG tablet Take 300 mg by mouth at bedtime.   ? cetirizine (ZYRTEC) 10 MG tablet Take 10 mg by mouth daily.  ? fluticasone (FLONASE) 50 MCG/ACT nasal spray Place 2 sprays into both nostrils daily as needed for allergies or rhinitis.  ? gabapentin (NEURONTIN) 100 MG capsule Take 200 mg by mouth at bedtime.  ? hydrochlorothiazide (HYDRODIURIL) 25 MG tablet Take 25 mg by mouth daily.   ? metFORMIN (GLUCOPHAGE-XR) 500 MG 24 hr tablet Take 1,000 mg by mouth 2 (two) times daily with a meal.   ? metoprolol succinate (TOPROL-XL) 25 MG 24 hr tablet Take 25 mg by mouth daily.   ? olmesartan (BENICAR) 40 MG tablet Take 40 mg by mouth daily.   ? rosuvastatin (CRESTOR) 20 MG tablet Take 1 tablet (20 mg total) by mouth daily.  ? [DISCONTINUED] pravastatin (PRAVACHOL) 80 MG tablet Take 80 mg by mouth at bedtime.   ?  ? ?Allergies:   Atorvastatin, Diovan [valsartan], Iodinated contrast media, Lotensin [benazepril], Sertraline, Sulfa antibiotics, Sulfasalazine, and Penicillins  ? ?Social History  ? ?Socioeconomic History  ? Marital status: Married  ?  Spouse name: Not on file  ? Number of children: Not on file  ? Years of education: Not on file  ? Highest education level: Not on file  ?Occupational History  ? Not on file  ?Tobacco Use  ? Smoking status: Former  ?  Packs/day: 1.00  ?  Years: 8.00  ?  Pack years: 8.00  ?  Types: Cigarettes  ?  Quit date: 11/10/1977  ?  Years since quitting: 43.5  ? Smokeless tobacco: Never  ?Vaping Use  ? Vaping Use: Never used  ?Substance and Sexual Activity  ? Alcohol use: Yes  ?  Alcohol/week: 1.0 standard drink  ?  Types: 1 Glasses of wine per week  ?  Drug use: No  ? Sexual activity: Not on file  ?Other Topics Concern  ? Not on file  ?Social History Narrative  ? Not on file  ? ?Social Determinants of Health  ? ?Financial Resource Strain: Not on file  ?Food Insecurity: Not on file  ?Transportation Needs: Not on file  ?Physical Activity: Not on file  ?Stress: Not on file  ?Social Connections: Not on file  ?  ? ?Family History: ?The patient's Family history is unknown by patient. ? ?ROS:   ?Review of Systems  ?Constitutional:  Negative for chills and fever.  ?HENT:  Negative for congestion.   ?  Eyes:  Negative for redness.  ?Respiratory:  Positive for shortness of breath. Negative for hemoptysis and stridor.   ?Cardiovascular:  Positive for chest pain and palpitations. Negative for orthopnea, claudication, leg swelling and PND.  ?Gastrointestinal:  Negative for diarrhea and melena.  ?Genitourinary:  Negative for flank pain.  ?Musculoskeletal:  Positive for joint pain. Negative for falls.  ?Neurological:  Negative for seizures and headaches.  ?Endo/Heme/Allergies:  Negative for polydipsia.  ?Psychiatric/Behavioral:  Negative for depression. The patient is not nervous/anxious.   ? ? ?EKGs/Labs/Other Studies Reviewed:   ? ?The following studies were reviewed today: ?Cath 2015: ?HEMODYNAMICS:  Aortic postcontrast 124/58, LV postcontrast 124/2-5.  There was no gradient between the left ventricle and aorta.   ?  ?ANGIOGRAPHIC DATA:   ?  ?CORONARY ARTERIES:   Arise and distribute normally.  Codominant. No coronary calcification is noted.  There is mild calcification noted in the aortic knob. ?  ?Left main coronary artery: Normal. ?  ?Left anterior descending: Scattered irregularities but no significant obstructive disease noted. ?  ?Circumflex coronary artery: Codominant vessel with a large first marginal branch and 3 posterolateral branches that contain no significant stenosis. ?  ?Right coronary artery: Calcified at its ostium, but scattered irregularities with no  significant stenoses. ?  ?LEFT VENTRICULOGRAM:  Performed in the 30? RAO projection.  The aortic and mitral valves are normal. The left ventricle is normal in size with normal wall motion. Estimated ejection fracti

## 2021-05-13 NOTE — Patient Instructions (Signed)
Medication Instructions:  ? ?STOP TAKING PRAVASTATIN NOW ? ?START TAKING ROSUVASTATIN (CRESTOR) 20 MG BY MOUTH DAILY ? ?*If you need a refill on your cardiac medications before your next appointment, please call your pharmacy* ? ? ?Lab Work: ? ?IN 6-8 WEEKS HERE IN THE OFFICE--CHECK LIPIDS--COME FASTING TO THIS LAB APPOINTMENT ? ?If you have labs (blood work) drawn today and your tests are completely normal, you will receive your results only by: ?MyChart Message (if you have MyChart) OR ?A paper copy in the mail ?If you have any lab test that is abnormal or we need to change your treatment, we will call you to review the results. ? ? ?Testing/Procedures: ? ?Your physician has requested that you have an echocardiogram. Echocardiography is a painless test that uses sound waves to create images of your heart. It provides your doctor with information about the size and shape of your heart and how well your heart?s chambers and valves are working. This procedure takes approximately one hour. There are no restrictions for this procedure. ? ? ? ? ?Your cardiac CT will be scheduled at one of the below locations:  ? ?[facility] ?[address] ?[place], [place] [address] ?[phone] ? ? ?If scheduled at [facility], please arrive at the [facility] En[facility] (Entrance C2) of [facility] 30 minutes prior to test start time. ?You can use the FREE valet parking offered at entrance C (encouraged to control the heart rate for the test)  ?Proceed to the [facility] Radiology Department (first floor) to check-in and test prep. ? ?All radiology patients and guests should use entrance C2 at [facility], accessed from [facility], even though the hospital's physical address listed is [address] ? ? ? ? ?Please follow these instructions carefully (unless otherwise directed): ? ? ?On the Night Before the Test: ?Be sure to Drink plenty of water. ?Do not consume  any caffeinated/decaffeinated beverages or chocolate 12 hours prior to your test. ?Do not take any antihistamines 12 hours prior to your test. ? ? ?On the Day of the Test: ?Drink plenty of water until 1 hour prior to the test. ?Do not eat any food 4 hours prior to the test. ?You may take your regular medications prior to the test.  ?Take metoprolol XL 25 MG BY MOUTH two hours prior to test. ?HOLD Furosemide/Hydrochlorothiazide morning of the test. ?FEMALES- please wear underwire-free bra if available, avoid dresses & tight clothing ? ?     ?After the Test: ?Drink plenty of water. ?After receiving IV contrast, you may experience a mild flushed feeling. This is normal. ?On occasion, you may experience a mild rash up to 24 hours after the test. This is not dangerous. If this occurs, you can take Benadryl 25 mg and increase your fluid intake. ?If you experience trouble breathing, this can be serious. If it is severe call 911 IMMEDIATELY. If it is mild, please call our office. ?If you take any of these medications: Glipizide/Metformin, Avandament, Glucavance, please do not take 48 hours after completing test unless otherwise instructed. ? ?We will call to schedule your test 2-4 weeks out understanding that some insurance companies will need an authorization prior to the service being performed.  ? ?For non-scheduling related questions, please contact the cardiac imaging nurse navigator should you have any questions/concerns: ?Marchia Bond, Cardiac Imaging Nurse Navigator ?Gordy Clement, Cardiac Imaging Nurse Navigator ?[facility] ?Direct Office Dial: [phone]  ? ?  For scheduling needs, including cancellations and rescheduling, please call [place], [phone]. ? ? ? ?Follow-Up: ?At [facility], you and your health needs are our priority.  As part of our continuing mission to provide you with exceptional heart care, we have created designated Provider Care Teams.  These Care Teams  include your primary Cardiologist (physician) and Advanced Practice Providers (APPs -  Physician Assistants and Nurse Practitioners) who all work together to provide you with the care you need, when you need it. ? ?We recommend signing up for the patient portal called "MyChart".  Sign up information is provided on this After Visit Summary.  MyChart is used to connect with patients for Virtual Visits (Telemedicine).  Patients are able to view lab/test results, encounter notes, upcoming appointments, etc.  Non-urgent messages can be sent to your provider as well.   ?To learn more about what you can do with MyChart, go to [email or web address].   ? ?Your next appointment:   ?6 month(s) ? ?The format for your next appointment:   ?In Person ? ?Provider:   ?DR. PEMBERTON  ? ?Important Information About Sugar ? ? ? ? ? ? ?

## 2021-05-15 ENCOUNTER — Telehealth: Payer: Self-pay | Admitting: *Deleted

## 2021-05-15 NOTE — Telephone Encounter (Signed)
Pts Cardiac CT is scheduled for 5/23 at 1130. ?Pt made aware of appt date and time by CT Scheduler. ?

## 2021-05-15 NOTE — Telephone Encounter (Signed)
-----   Message from Nuala Alpha, LPN sent at 07/12/2927 11:34 AM EDT ----- ?Regarding: CARDIAC CT PER DR. Johney Frame ?Dr. Johney Frame ordered a Cardiac CT on this pt for cp.  ?Pt will need a bmet for she had to go to work.  Order in for ct and lab. ?Please schedule and let me know the date?  Thanks for helping [place] with all these CTs. ? ?Thanks, ?Tiberius Loftus  ? ? ? ?

## 2021-05-18 ENCOUNTER — Ambulatory Visit (HOSPITAL_COMMUNITY): Payer: Medicare HMO | Attending: Cardiology

## 2021-05-18 DIAGNOSIS — I509 Heart failure, unspecified: Secondary | ICD-10-CM | POA: Insufficient documentation

## 2021-05-18 DIAGNOSIS — R072 Precordial pain: Secondary | ICD-10-CM | POA: Insufficient documentation

## 2021-05-18 LAB — ECHOCARDIOGRAM COMPLETE
Area-P 1/2: 4.71 cm2
S' Lateral: 2.9 cm

## 2021-05-18 MED ORDER — PERFLUTREN LIPID MICROSPHERE
1.0000 mL | INTRAVENOUS | Status: AC | PRN
  Administered 2021-05-18: 2 mL via INTRAVENOUS

## 2021-05-29 ENCOUNTER — Telehealth (HOSPITAL_COMMUNITY): Payer: Self-pay | Admitting: Emergency Medicine

## 2021-05-29 ENCOUNTER — Encounter (HOSPITAL_COMMUNITY): Payer: Self-pay

## 2021-05-29 ENCOUNTER — Other Ambulatory Visit (HOSPITAL_COMMUNITY): Payer: Self-pay | Admitting: Emergency Medicine

## 2021-05-29 DIAGNOSIS — Z91041 Radiographic dye allergy status: Secondary | ICD-10-CM

## 2021-05-29 MED ORDER — DIPHENHYDRAMINE HCL 50 MG PO CAPS: ORAL_CAPSULE | ORAL | 0 refills | Status: DC

## 2021-05-29 MED ORDER — PREDNISONE 50 MG PO TABS: ORAL_TABLET | ORAL | 0 refills | Status: DC

## 2021-05-29 NOTE — Telephone Encounter (Signed)
Reaching out to patient to offer assistance regarding upcoming cardiac imaging study; pt verbalizes understanding of appt date/time, parking situation and where to check in, pre-test NPO status and medications ordered, and verified current allergies; name and call back number provided for further questions should they arise Marchia Bond RN Navigator Cardiac Imaging [facility] [phone] office [phone] cell  Allergy to contrast media = diarrhea  13 hr prep ordered as precaution Holding metformin, HCTZ '25mg'$  metoprolol succ 2 hr prior to scan Arrival 1100

## 2021-06-01 ENCOUNTER — Other Ambulatory Visit: Payer: Medicare HMO | Admitting: *Deleted

## 2021-06-01 DIAGNOSIS — R072 Precordial pain: Secondary | ICD-10-CM

## 2021-06-01 DIAGNOSIS — Z79899 Other long term (current) drug therapy: Secondary | ICD-10-CM | POA: Diagnosis not present

## 2021-06-01 LAB — BASIC METABOLIC PANEL
BUN/Creatinine Ratio: 38 — ABNORMAL HIGH (ref 12–28)
BUN: 33 mg/dL — ABNORMAL HIGH (ref 8–27)
CO2: 25 mmol/L (ref 20–29)
Calcium: 9.4 mg/dL (ref 8.7–10.3)
Chloride: 102 mmol/L (ref 96–106)
Creatinine, Ser: 0.87 mg/dL (ref 0.57–1.00)
Glucose: 177 mg/dL — ABNORMAL HIGH (ref 70–99)
Potassium: 4 mmol/L (ref 3.5–5.2)
Sodium: 142 mmol/L (ref 134–144)
eGFR: 69 mL/min/{1.73_m2} (ref >59)

## 2021-06-02 ENCOUNTER — Other Ambulatory Visit (HOSPITAL_BASED_OUTPATIENT_CLINIC_OR_DEPARTMENT_OTHER): Payer: Self-pay | Admitting: Cardiology

## 2021-06-02 ENCOUNTER — Other Ambulatory Visit (HOSPITAL_COMMUNITY): Payer: Self-pay | Admitting: Emergency Medicine

## 2021-06-02 ENCOUNTER — Ambulatory Visit (HOSPITAL_BASED_OUTPATIENT_CLINIC_OR_DEPARTMENT_OTHER)
Admission: RE | Admit: 2021-06-02 | Discharge: 2021-06-02 | Disposition: A | Discharge status: Home / Self Care | Payer: Medicare HMO | Source: Ambulatory Visit | Attending: Cardiology | Admitting: Cardiology

## 2021-06-02 ENCOUNTER — Ambulatory Visit (HOSPITAL_COMMUNITY)
Admission: RE | Admit: 2021-06-02 | Discharge: 2021-06-02 | Disposition: A | Discharge status: Home / Self Care | Payer: Medicare HMO | Source: Ambulatory Visit | Attending: Cardiology | Admitting: Cardiology

## 2021-06-02 DIAGNOSIS — R931 Abnormal findings on diagnostic imaging of heart and coronary circulation: Secondary | ICD-10-CM | POA: Diagnosis not present

## 2021-06-02 DIAGNOSIS — I2510 Atherosclerotic heart disease of native coronary artery without angina pectoris: Secondary | ICD-10-CM | POA: Diagnosis not present

## 2021-06-02 DIAGNOSIS — R072 Precordial pain: Secondary | ICD-10-CM | POA: Diagnosis not present

## 2021-06-02 IMAGING — CT CT HEART MORP W/ CTA COR W/ SCORE W/ CA W/CM &/OR W/O CM
4 of 7 series · 8 of 20 positions shown, 9 images · IV contrast (omnipaque)
Comparison: CT chest 08/12/2019
COMPARISON: CT chest 08/12/2019

Addendum:
EXAM:
OVER-READ INTERPRETATION  CT CHEST

The following report is a limited chest CT over-read performed by
This over-read does not include interpretation of cardiac or
coronary anatomy or pathology. The coronary CTA interpretation by
the cardiologist is attached.
HISTORY: Chest pain, nonspecific
Cardiac/Coronary CT
TECHNIQUE: The patient was scanned on a Siemens Force scanner.
PROTOCOL: A 100 kV prospective scan was triggered in the descending thoracic
aorta at 111 HU's. Axial non-contrast 3 mm slices were carried out
through the heart. The data set was analyzed on a dedicated work
station and scored using the Agatston method. Gantry rotation speed
was 250 msecs and collimation was .6 mm. Heart rate was optimized
medically and sl NTG was given. The 3D data set was reconstructed in
5% intervals of the 35-75 % of the R-R cycle. Systolic and diastolic
phases were analyzed on a dedicated work station using MPR, MIP and
VRT modes. The patient received 100mL OMNIPAQUE IOHEXOL 350 MG/ML
SOLN of contrast.

[Series 6: ts diast sharp · axial · 0.33mm/px · z∈[-186,-149]mm · 2 of 278 slices shown]
[im 93/278  lung]
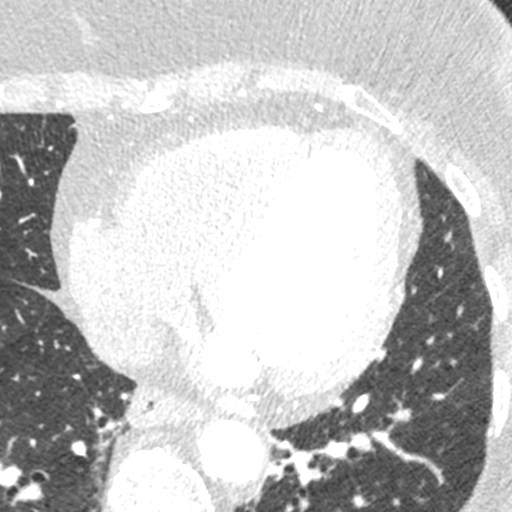
[im 185/278  lung]
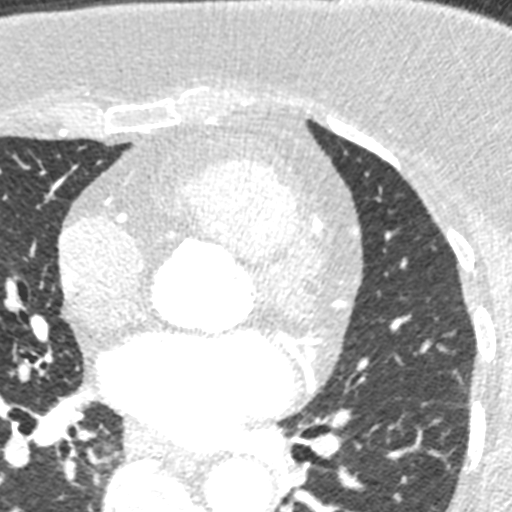

[Series 7: ts syst sharp · axial · 0.33mm/px · z∈[-186,-149]mm · 2 of 278 slices shown]
[im 93/278  lung]
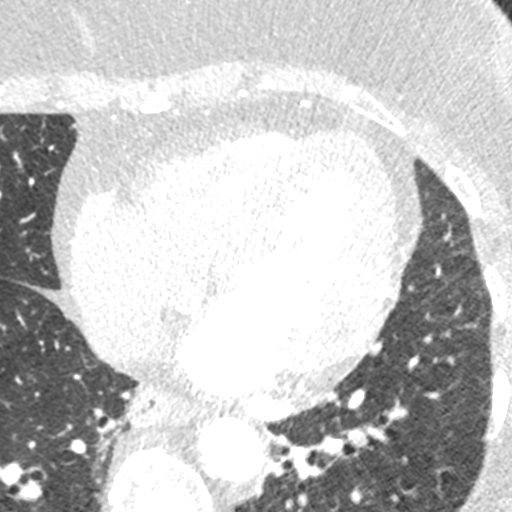
[im 185/278  lung]
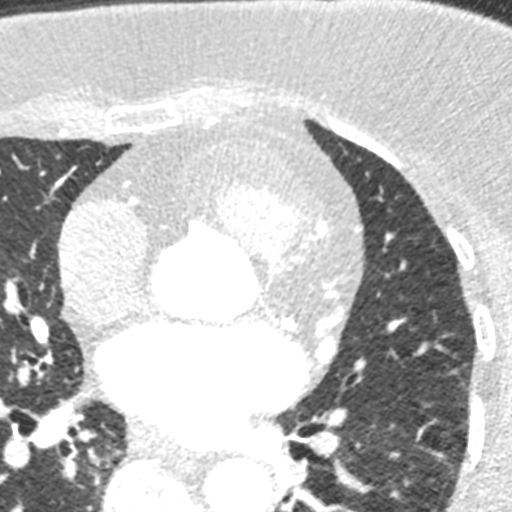

[Series 8: best syst · axial · 0.33mm/px · z∈[-186,-149]mm · 2 of 278 slices shown, 3 images]
[im 93/278  vessel]
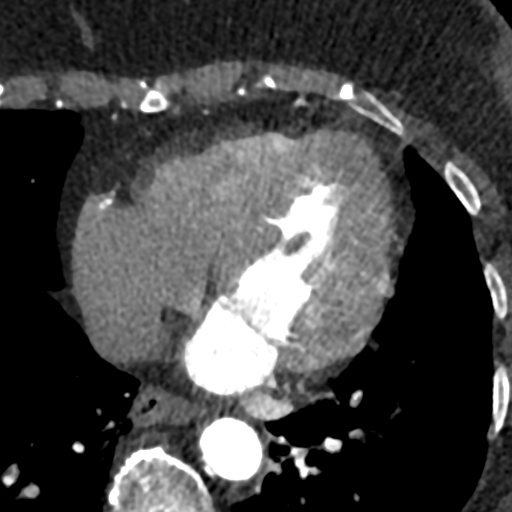
[im 93/278  lung]
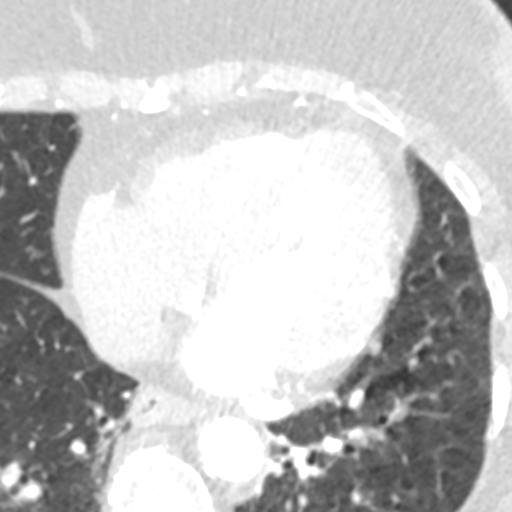
[im 185/278  vessel]
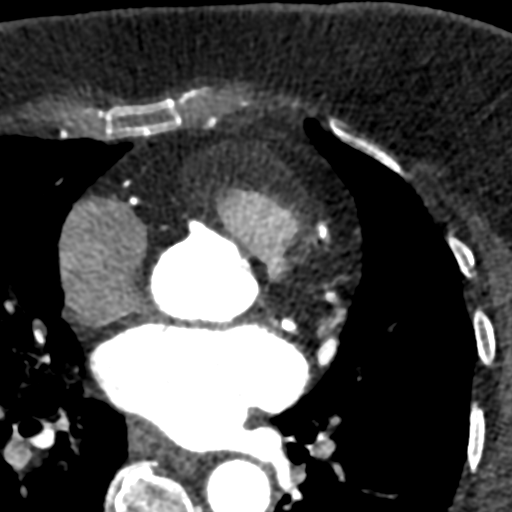

[Series 9: best diast · axial · 0.33mm/px · z∈[-186,-149]mm · 2 of 278 slices shown]
[im 93/278  vessel]
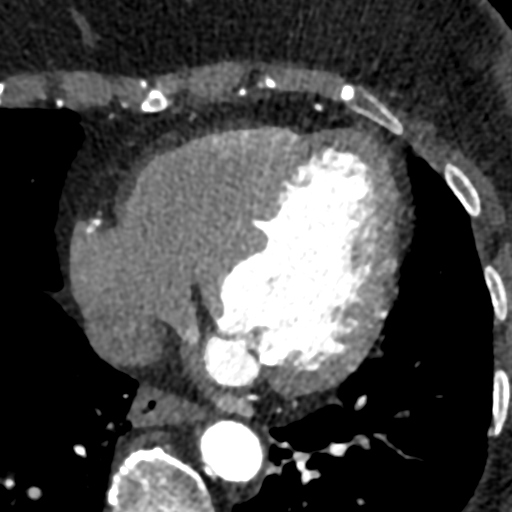
[im 185/278  vessel]
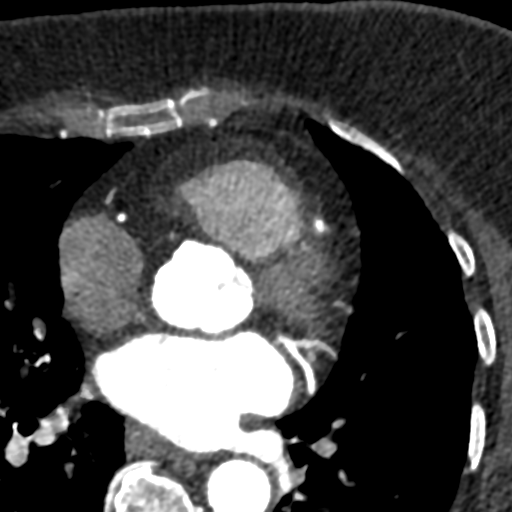

[8 of 20 positions shown; findings below may reference images not displayed]

FINDINGS: Vascular: Coronary artery, aortic valve, and mitral annular
calcifications. And no significant extra cardiovascular findings.

Mediastinum/Nodes: No lymphadenopathy.

Lungs/Pleura: No focal airspace disease. No suspicious pulmonary
nodules. Left basilar atelectasis.

Upper Abdomen: No acute abnormality.

Musculoskeletal: No acute osseous abnormality. No suspicious lytic
or blastic lesions. Stair step artifact of the lower sternum.
IMPRESSION: No acute or significant incidental extracardiac findings in the
chest. Interpretation of cardiac/coronary anatomy by cardiology to
follow.
FINDINGS: Coronary calcium score: The patient's coronary artery calcium score
is 287, which places the patient in the 75th percentile.

Coronary arteries: Normal coronary origins.  Right dominance.

Right Coronary Artery: Normal caliber vessel, gives rise to PDA.
There is predominantly noncalcified plaque in the proximal RCA with
70-99% stenosis.

Left Main Coronary Artery: Normal caliber vessel. Ostial mixed
plaque with 1-10% stenosis.

Left Anterior Descending Coronary Artery: Normal caliber vessel.
Scattered mixed calcified and noncalcified plaque in proximal LAD
with 25-49% stenosis. Gives rise to large first and small second
diagonal branches.

Left Circumflex Artery: Normal caliber vessel. Predominantly
calcified plaque in proximal LCx with 25-49% stenosis. Gives rise to
3 OM branches.

Aorta: Normal size, 30 mm at the mid ascending aorta (level of the
PA bifurcation) measured double oblique. Aortic atherosclerosis. No
dissection seen in visualized portions of the aorta.

Aortic Valve: Scattered calcifications. Trileaflet.

Other findings:

Normal pulmonary vein drainage into the left atrium.

Normal left atrial appendage without a thrombus.

Normal size of the pulmonary artery.

Normal appearance of the pericardium.

Mitral annular calcification.

Possible small PFO without evidence of shunting.
IMPRESSION: 1. Severe CAD, CADRADS = 4. CT FFR will be performed and reported
separately.

2. Coronary calcium score of 287. This was 75th percentile for age
and sex matched control.

3. Normal coronary origin with right dominance.

4.  Aortic atherosclerosis.

INTERPRETATION:

1. CAD-RADS 0: No evidence of CAD (0%). Consider non-atherosclerotic
causes of chest pain.

2. CAD-RADS 1: Minimal non-obstructive CAD (0-24%). Consider
non-atherosclerotic causes of chest pain. Consider preventive
therapy and risk factor modification.

3. CAD-RADS 2: Mild non-obstructive CAD (25-49%). Consider
non-atherosclerotic causes of chest pain. Consider preventive
therapy and risk factor modification.

4. CAD-RADS 3: Moderate stenosis (50-69%). Consider symptom-guided
anti-ischemic pharmacotherapy as well as risk factor modification
per guideline directed care. Additional analysis with CT FFR will be
submitted.

5. CAD-RADS 4: Severe stenosis. (70-99% or > 50% left main). Cardiac
catheterization or CT FFR is recommended. Consider symptom-guided
anti-ischemic pharmacotherapy as well as risk factor modification
per guideline directed care. Invasive coronary angiography
recommended with revascularization per published guideline
statements.

6. CAD-RADS 5: Total coronary occlusion (100%). Consider cardiac
catheterization or viability assessment. Consider symptom-guided
anti-ischemic pharmacotherapy as well as risk factor modification
per guideline directed care.

7. CAD-RADS N: Non-diagnostic study. Obstructive CAD can't be
excluded. Alternative evaluation is recommended.

*** End of Addendum ***
EXAM:
OVER-READ INTERPRETATION  CT CHEST

The following report is a limited chest CT over-read performed by
This over-read does not include interpretation of cardiac or
coronary anatomy or pathology. The coronary CTA interpretation by
the cardiologist is attached.
FINDINGS: Vascular: Coronary artery, aortic valve, and mitral annular
calcifications. And no significant extra cardiovascular findings.

Mediastinum/Nodes: No lymphadenopathy.

Lungs/Pleura: No focal airspace disease. No suspicious pulmonary
nodules. Left basilar atelectasis.

Upper Abdomen: No acute abnormality.

Musculoskeletal: No acute osseous abnormality. No suspicious lytic
or blastic lesions. Stair step artifact of the lower sternum.
IMPRESSION: No acute or significant incidental extracardiac findings in the
chest. Interpretation of cardiac/coronary anatomy by cardiology to
follow.

## 2021-06-02 MED ORDER — METOPROLOL TARTRATE 5 MG/5ML IV SOLN
10.0000 mg | INTRAVENOUS | Status: DC | PRN
  Administered 2021-06-02: 10 mg via INTRAVENOUS

## 2021-06-02 MED ORDER — NITROGLYCERIN 0.4 MG SL SUBL
SUBLINGUAL_TABLET | SUBLINGUAL | Status: AC
  Filled 2021-06-02: qty 2

## 2021-06-02 MED ORDER — METOPROLOL TARTRATE 5 MG/5ML IV SOLN
INTRAVENOUS | Status: AC
  Administered 2021-06-02: 10 mg via INTRAVENOUS
  Filled 2021-06-02: qty 20

## 2021-06-02 MED ORDER — NITROGLYCERIN 0.4 MG SL SUBL: 0.8000 mg | SUBLINGUAL_TABLET | Freq: Once | SUBLINGUAL | Status: DC

## 2021-06-02 MED ORDER — DILTIAZEM HCL 25 MG/5ML IV SOLN: 10.0000 mg | INTRAVENOUS | Status: DC | PRN

## 2021-06-02 MED ORDER — DILTIAZEM HCL 25 MG/5ML IV SOLN
INTRAVENOUS | Status: AC
  Administered 2021-06-02: 10 mg via INTRAVENOUS
  Filled 2021-06-02: qty 5

## 2021-06-02 MED ORDER — IOHEXOL 350 MG/ML SOLN
100.0000 mL | Freq: Once | INTRAVENOUS | Status: AC | PRN
  Administered 2021-06-02: 100 mL via INTRAVENOUS

## 2021-06-10 ENCOUNTER — Ambulatory Visit: Payer: Medicare HMO | Admitting: Physician Assistant

## 2021-06-10 ENCOUNTER — Encounter: Payer: Self-pay | Admitting: Physician Assistant

## 2021-06-10 VITALS — BP 94/44 | HR 84 | Ht 61.0 in | Wt 170.4 lb

## 2021-06-10 DIAGNOSIS — E785 Hyperlipidemia, unspecified: Secondary | ICD-10-CM

## 2021-06-10 DIAGNOSIS — I25119 Atherosclerotic heart disease of native coronary artery with unspecified angina pectoris: Secondary | ICD-10-CM | POA: Diagnosis not present

## 2021-06-10 DIAGNOSIS — I2510 Atherosclerotic heart disease of native coronary artery without angina pectoris: Secondary | ICD-10-CM | POA: Insufficient documentation

## 2021-06-10 DIAGNOSIS — I10 Essential (primary) hypertension: Secondary | ICD-10-CM | POA: Diagnosis not present

## 2021-06-10 MED ORDER — OLMESARTAN MEDOXOMIL 20 MG PO TABS: 10.0000 mg | ORAL_TABLET | Freq: Every day | ORAL | 3 refills | Status: AC

## 2021-06-10 MED ORDER — NITROGLYCERIN 0.4 MG SL SUBL: 0.4000 mg | SUBLINGUAL_TABLET | SUBLINGUAL | 3 refills | Status: DC | PRN

## 2021-06-10 NOTE — Assessment & Plan Note (Signed)
Recent CCTA demonstrates significant stenosis in the RCA which is hemodynamically significant by FFR.  Cardiac catheterization has been recommended.  She has stable anginal symptoms (CCS class II-III).  Continue aspirin 81 mg daily, metoprolol succinate 25 mg daily.  Prescription for as needed nitroglycerin will be given.  We discussed how to use this and when to go to the emergency room.  Arrange cardiac catheterization later this week.

## 2021-06-10 NOTE — Patient Instructions (Addendum)
Medication Instructions:  Your physician has recommended you make the following change in your medication:   REDUCE Benicar (Olmesartan) to 20 mg taking only 1/2 tablet daily   START Nitroglycerin 0.4 s/l tablet.Marland Kitchen only as needed.. The proper use and anticipated side effects of nitroglycerine has been carefully explained.  If a single episode of chest pain is not relieved by one tablet, the patient will try another within 5 minutes; and if this doesn't relieve the pain, the patient is instructed to call 911 for transportation to an emergency department.   *If you need a refill on your cardiac medications before your next appointment, please call your pharmacy*   Lab Work: TODAY:  BMET & CBC  If you have labs (blood work) drawn today and your tests are completely normal, you will receive your results only by: [place] (if you have MyChart) OR A paper copy in the mail If you have any lab test that is abnormal or we need to change your treatment, we will call you to review the results.   Testing/Procedures: Your physician has requested that you have a cardiac catheterization. Cardiac catheterization is used to diagnose and/or treat various heart conditions. Doctors may recommend this procedure for a number of different reasons. The most common reason is to evaluate chest pain. Chest pain can be a symptom of coronary artery disease (CAD), and cardiac catheterization can show whether plaque is narrowing or blocking your heart's arteries. This procedure is also used to evaluate the valves, as well as measure the blood flow and oxygen levels in different parts of your heart. For further information please visit [email or web address]. Please follow instruction sheet, BELOW:   [place] OFFICE [place], [place] [address] Dept: [phone] Loc: [place]  06/10/2021  You are  scheduled for a Cardiac Catheterization on Friday, June 2 with Dr. [name].  1. Please arrive at the Main Entrance A at [facility]: [place], [place] [address] at 10:00 AM (This time is two hours before your procedure to ensure your preparation). Free valet parking service is available.   Special note: Every effort is made to have your procedure done on time. Please understand that emergencies sometimes delay scheduled procedures.  2. Diet: Do not eat solid foods after midnight.  You may have clear liquids until 5 AM upon the day of the procedure.  3. Labs: WILL BE DONE TODAY  4. Medication instructions in preparation for your procedure:   Contrast Allergy: No  Stop taking, HTCZ (Hydrochlorothiazide) Friday, June 2,    Do not take Diabetes Med Glucophage (Metformin) on the day of the procedure and HOLD 48 HOURS AFTER THE PROCEDURE.  On the morning of your procedure, take Aspirin and any morning medicines NOT listed above.  You may use sips of water.  5. Plan to go home the same day, you will only stay overnight if medically necessary. 6. You MUST have a responsible adult to drive you home. 7. An adult MUST be with you the first 24 hours after you arrive home. 8. Bring a current list of your medications, and the last time and date medication taken. 9. Bring ID and current insurance cards. 10.Please wear clothes that are easy to get on and off and wear slip-on shoes.  Thank you for allowing [place] to care for you!   -- [facility]  Follow-Up: At [facility], you and your health needs are our priority.  As part of our continuing mission to provide you with exceptional heart care, we have created designated Provider Care Teams.  These Care Teams include your primary Cardiologist (physician) and Advanced Practice Providers (APPs -  Physician Assistants and Nurse Practitioners) who all work together to provide you with the  care you need, when you need it.  We recommend signing up for the patient portal called "MyChart".  Sign up information is provided on this After Visit Summary.  MyChart is used to connect with patients for Virtual Visits (Telemedicine).  Patients are able to view lab/test results, encounter notes, upcoming appointments, etc.  Non-urgent messages can be sent to your provider as well.   To learn more about what you can do with MyChart, go to [email or web address].    Your next appointment:   2 week(s)  06/29/21 ARRIVE AT 10:40  The format for your next appointment:   In Person  Provider:   Richardson Dopp, PA-C         Other Instructions   Important Information About Sugar

## 2021-06-10 NOTE — Assessment & Plan Note (Signed)
Blood pressure has been running somewhat low.  She notes issues with fluid gains that she stopped taking HCTZ.  Decrease olmesartan to 10 mg daily.  Continue HCTZ 25 mg daily, Toprol-XL 25 mg daily.

## 2021-06-10 NOTE — Progress Notes (Signed)
Cardiology Office Note:    Date:  06/10/2021   ID:  Shannon Moore, DOB 17-May-1945, MRN [id number]  PCP:  Harlan Stains, MD  [facility] Providers Cardiologist:  Freada Bergeron, MD    Referring MD: Harlan Stains, MD   Chief Complaint:  Follow-up for abnormal CCTA; arrange cardiac catheterization    Patient Profile: (HFpEF) heart failure with preserved ejection fraction  Coronary artery disease  Mild non-obstructive CAD by cath in 2015 Hypertension  Hyperlipidemia  Diabetes mellitus  palpitations  GERD Hx of Bell's Palsy Neuropathy   Prior CV Studies: CCTA 06/02/21 Coronary calcium score:  287,  75th percentile. Right Coronary Artery: Noncalcified plaque in the proximal RCA with70-99% stenosis. Left Main Coronary Artery: Ostial mixed plaque with 1-10% stenosis. Left Anterior Descending Coronary Artery:  Scattered mixed calcified and noncalcified plaque in proximal LAD with 25-49% stenosis.  Left Circumflex Artery: Predominantly calcified plaque in proximal LCx with 25-49%   Aorta: Normal size, 30 mm at the mid ascending aorta (level of the PA bifurcation)  FFR 1. Left Main:  No significant stenosis. FFR = 0.99 2. LAD: No significant stenosis. Proximal FFR = 0.98, Mid FFR = 0.95, Distal FFR = 0.89 3. LCX: No significant stenosis. Proximal FFR = 0.94, Distal FFR = 0.93 4. RCA: No significant stenosis. Ostial FFR = 0.99, FFR after proximal stenosis = 0.67 IMPRESSION: 1.  CT FFR analysis shows significant stenosis in the proximal RCA.  Echocardiogram 05/18/21 EF 55-60, no RWMA, GR 1 DD, normal RVSF, trivial MR, moderate MAC, mild AV calcification  History of Present Illness:   Shannon Moore is a 76 y.o. female with the above problem list.  She was evaluated by Dr. Johney Frame in May 23 for chest pain and shortness of breath.  An echocardiogram and CCTA was obtained.   The echocardiogram showed normal EF.  CCTA shows probable hemodynamically significant stenosis in the  RCA.  Cardiac catheterization has been recommended.  She presents to arrange cardiac catheterization.  She is here today with her husband.  She continues to note left-sided chest discomfort from time to time.  She definitely notes it with exertion.  She has associated shortness of breath.  She did have 1 episode with some associated neck pain.  She has not had syncope, orthopnea, leg edema.  She does feel dizzy at times.  Her blood pressures have been running low at home (systolic 01B).    Past Medical History:  Diagnosis Date   Abnormal mammogram    Adenomatous polyp of colon    Anginal pain (HCC)    Arthritis    Hands, Knees hips, feet,    Bell's palsy    CHF (congestive heart failure) ([place]) 1990s   pt feels it is from Zoloft or a virus   Diabetic peripheral neuropathy (HCC)    Diastolic dysfunction    Diverticulitis    DM (diabetes mellitus) ([place])    Dysphagia    Esophageal obstruction    Essential hypertension 01/08/2014   Female cystocele    GERD (gastroesophageal reflux disease)    Goiter    Gout    History of 2019 novel coronavirus disease (COVID-19)    HTN (hypertension)    Hyperlipidemia 01/08/2014   Migraines    Mixed incontinence urge and stress    Obesity (BMI 30-39.9) 01/08/2014   Osteoarthritis of knee    Thyroid nodule    Type 2 diabetes mellitus without complication ([place]) 51/02/5850   Current Medications: Current Meds  Medication  Sig   allopurinol (ZYLOPRIM) 300 MG tablet Take 300 mg by mouth daily with supper.   aspirin EC 81 MG tablet 81 mg daily.   cetirizine (ZYRTEC) 10 MG tablet Take 10 mg by mouth daily.   fluticasone (FLONASE) 50 MCG/ACT nasal spray Place 2 sprays into both nostrils daily as needed for allergies or rhinitis.   gabapentin (NEURONTIN) 300 MG capsule Take 300-600 mg by mouth at bedtime.   hydrochlorothiazide (HYDRODIURIL) 25 MG tablet Take 25 mg by mouth every morning.   metFORMIN (GLUCOPHAGE-XR) 500 MG 24 hr tablet Take 500 mg by mouth 2  (two) times daily with a meal.   metoprolol succinate (TOPROL-XL) 25 MG 24 hr tablet Take 25 mg by mouth daily with supper.   nitroGLYCERIN (NITROSTAT) 0.4 MG SL tablet Place 1 tablet (0.4 mg total) under the tongue every 5 (five) minutes as needed for chest pain.   olmesartan (BENICAR) 20 MG tablet Take 0.5 tablets (10 mg total) by mouth daily. (Patient taking differently: Take 10 mg by mouth every morning.)   rosuvastatin (CRESTOR) 20 MG tablet Take 1 tablet (20 mg total) by mouth daily. (Patient taking differently: Take 20 mg by mouth daily with supper.)   [DISCONTINUED] olmesartan (BENICAR) 20 MG tablet Take 20 mg by mouth daily.    Allergies:   Atorvastatin, Diovan [valsartan], Iodinated contrast media, Lotensin [benazepril], Sertraline, Sulfa antibiotics, Sulfasalazine, and Penicillins   Social History   Tobacco Use   Smoking status: Former    Packs/day: 1.00    Years: 8.00    Pack years: 8.00    Types: Cigarettes    Quit date: 11/10/1977    Years since quitting: 43.6   Smokeless tobacco: Never  Vaping Use   Vaping Use: Never used  Substance Use Topics   Alcohol use: Yes    Alcohol/week: 1.0 standard drink    Types: 1 Glasses of wine per week   Drug use: No    Family Hx: The patient's Family history is unknown by patient.  Review of Systems  Constitutional: Negative for fever.  Respiratory:  Negative for cough.   Gastrointestinal:  Negative for diarrhea, hematochezia and vomiting.  Genitourinary:  Negative for hematuria.    EKGs/Labs/Other Test Reviewed:    EKG:  EKG is  ordered today.  The ekg ordered today demonstrates NSR, HR 79, left axis deviation, poor wave progression, nonspecific ST-T wave changes, QTc 456  Recent Labs: 06/01/2021: BUN 33; Creatinine, Ser 0.87; Potassium 4.0; Sodium 142   Recent Lipid Panel No results for input(s): CHOL, TRIG, HDL, VLDL, LDLCALC, LDLDIRECT in the last 8760 hours.   Risk Assessment/Calculations:         Physical Exam:     VS:  BP (!) 94/44   Pulse 84   Ht _0  (1.549 m)   Wt 170 lb 6.4 oz (77.3 kg)   SpO2 98%   BMI 32.20 kg/m     Wt Readings from Last 3 Encounters:  06/10/21 170 lb 6.4 oz (77.3 kg)  05/13/21 172 lb (78 kg)  08/12/19 165 lb (74.8 kg)    Constitutional:      Appearance: Healthy appearance. Not in distress.  Neck:     Vascular: No JVR. JVD normal.  Pulmonary:     Effort: Pulmonary effort is normal.     Breath sounds: No wheezing. No rales.  Cardiovascular:     Normal rate. Regular rhythm. Normal S1. Normal S2.      Murmurs: There is no murmur.  Edema:    Peripheral edema absent.  Abdominal:     Palpations: Abdomen is soft.     Tenderness: There is no abdominal tenderness.  Skin:    General: Skin is warm and dry.  Neurological:     Mental Status: Alert and oriented to person, place and time.     Cranial Nerves: Cranial nerves are intact.        ASSESSMENT & PLAN:   CAD (coronary artery disease) Recent CCTA demonstrates significant stenosis in the RCA which is hemodynamically significant by FFR.  Cardiac catheterization has been recommended.  She has stable anginal symptoms (CCS class II-III).  Continue aspirin 81 mg daily, metoprolol succinate 25 mg daily.  Prescription for as needed nitroglycerin will be given.  We discussed how to use this and when to go to the emergency room.  Arrange cardiac catheterization later this week.  Essential hypertension Blood pressure has been running somewhat low.  She notes issues with fluid gains that she stopped taking HCTZ.  Decrease olmesartan to 10 mg daily.  Continue HCTZ 25 mg daily, Toprol-XL 25 mg daily.  Hyperlipidemia LDL goal <70 Labs obtained through [facility] - personally reviewed and interpreted: 04/14/21: Total cholesterol 205, HDL 56, LDL 107, triglycerides 241  Pravastatin recently changed to rosuvastatin.  Continue rosuvastatin 20 mg daily.  If LDL does not get to goal with this, consider adding ezetimibe versus referral  to MD clinic for PCSK9 inhibitor.         Shared Decision Making/Informed Consent The risks [stroke (1 in 1000), death (1 in 1000), kidney failure [usually temporary] (1 in 500), bleeding (1 in 200), allergic reaction [possibly serious] (1 in 200)], benefits (diagnostic support and management of coronary artery disease) and alternatives of a cardiac catheterization were discussed in detail with Ms. Lembke and she is willing to proceed.   Dispo:  Return for Post Procedure Follow Up.   Medication Adjustments/Labs and Tests Ordered: Current medicines are reviewed at length with the patient today.  Concerns regarding medicines are outlined above.  Tests Ordered: Orders Placed This Encounter  Procedures   Basic metabolic panel   CBC   EKG 12-Lead   Medication Changes: Meds ordered this encounter  Medications   olmesartan (BENICAR) 20 MG tablet    Sig: Take 0.5 tablets (10 mg total) by mouth daily.    Dispense:  45 tablet    Refill:  3   nitroGLYCERIN (NITROSTAT) 0.4 MG SL tablet    Sig: Place 1 tablet (0.4 mg total) under the tongue every 5 (five) minutes as needed for chest pain.    Dispense:  25 tablet    Refill:  3   Signed, Richardson Dopp, PA-C  06/10/2021 5:41 PM    [facility] [place], [place], [place]  [address] Phone: [phone]; Fax: [phone]

## 2021-06-10 NOTE — H&P (View-Only) (Signed)
Cardiology Office Note:    Date:  06/10/2021   ID:  Shannon Moore, DOB March 18, 1945, MRN [id number]  PCP:  Harlan Stains, MD  [facility] Providers Cardiologist:  Freada Bergeron, MD    Referring MD: Harlan Stains, MD   Chief Complaint:  Follow-up for abnormal CCTA; arrange cardiac catheterization    Patient Profile: (HFpEF) heart failure with preserved ejection fraction  Coronary artery disease  Mild non-obstructive CAD by cath in 2015 Hypertension  Hyperlipidemia  Diabetes mellitus  palpitations  GERD Hx of Bell's Palsy Neuropathy   Prior CV Studies: CCTA 06/02/21 Coronary calcium score:  287,  75th percentile. Right Coronary Artery: Noncalcified plaque in the proximal RCA with70-99% stenosis. Left Main Coronary Artery: Ostial mixed plaque with 1-10% stenosis. Left Anterior Descending Coronary Artery:  Scattered mixed calcified and noncalcified plaque in proximal LAD with 25-49% stenosis.  Left Circumflex Artery: Predominantly calcified plaque in proximal LCx with 25-49%   Aorta: Normal size, 30 mm at the mid ascending aorta (level of the PA bifurcation)  FFR 1. Left Main:  No significant stenosis. FFR = 0.99 2. LAD: No significant stenosis. Proximal FFR = 0.98, Mid FFR = 0.95, Distal FFR = 0.89 3. LCX: No significant stenosis. Proximal FFR = 0.94, Distal FFR = 0.93 4. RCA: No significant stenosis. Ostial FFR = 0.99, FFR after proximal stenosis = 0.67 IMPRESSION: 1.  CT FFR analysis shows significant stenosis in the proximal RCA.  Echocardiogram 05/18/21 EF 55-60, no RWMA, GR 1 DD, normal RVSF, trivial MR, moderate MAC, mild AV calcification  History of Present Illness:   Shannon Moore is a 76 y.o. female with the above problem list.  She was evaluated by Dr. Johney Frame in May 23 for chest pain and shortness of breath.  An echocardiogram and CCTA was obtained.   The echocardiogram showed normal EF.  CCTA shows probable hemodynamically significant stenosis in the  RCA.  Cardiac catheterization has been recommended.  She presents to arrange cardiac catheterization.  She is here today with her husband.  She continues to note left-sided chest discomfort from time to time.  She definitely notes it with exertion.  She has associated shortness of breath.  She did have 1 episode with some associated neck pain.  She has not had syncope, orthopnea, leg edema.  She does feel dizzy at times.  Her blood pressures have been running low at home (systolic 08X).    Past Medical History:  Diagnosis Date   Abnormal mammogram    Adenomatous polyp of colon    Anginal pain (HCC)    Arthritis    Hands, Knees hips, feet,    Bell's palsy    CHF (congestive heart failure) ([place]) 1990s   pt feels it is from Zoloft or a virus   Diabetic peripheral neuropathy (HCC)    Diastolic dysfunction    Diverticulitis    DM (diabetes mellitus) ([place])    Dysphagia    Esophageal obstruction    Essential hypertension 01/08/2014   Female cystocele    GERD (gastroesophageal reflux disease)    Goiter    Gout    History of 2019 novel coronavirus disease (COVID-19)    HTN (hypertension)    Hyperlipidemia 01/08/2014   Migraines    Mixed incontinence urge and stress    Obesity (BMI 30-39.9) 01/08/2014   Osteoarthritis of knee    Thyroid nodule    Type 2 diabetes mellitus without complication ([place]) 44/81/8563   Current Medications: Current Meds  Medication  Sig   allopurinol (ZYLOPRIM) 300 MG tablet Take 300 mg by mouth daily with supper.   aspirin EC 81 MG tablet 81 mg daily.   cetirizine (ZYRTEC) 10 MG tablet Take 10 mg by mouth daily.   fluticasone (FLONASE) 50 MCG/ACT nasal spray Place 2 sprays into both nostrils daily as needed for allergies or rhinitis.   gabapentin (NEURONTIN) 300 MG capsule Take 300-600 mg by mouth at bedtime.   hydrochlorothiazide (HYDRODIURIL) 25 MG tablet Take 25 mg by mouth every morning.   metFORMIN (GLUCOPHAGE-XR) 500 MG 24 hr tablet Take 500 mg by mouth 2  (two) times daily with a meal.   metoprolol succinate (TOPROL-XL) 25 MG 24 hr tablet Take 25 mg by mouth daily with supper.   nitroGLYCERIN (NITROSTAT) 0.4 MG SL tablet Place 1 tablet (0.4 mg total) under the tongue every 5 (five) minutes as needed for chest pain.   olmesartan (BENICAR) 20 MG tablet Take 0.5 tablets (10 mg total) by mouth daily. (Patient taking differently: Take 10 mg by mouth every morning.)   rosuvastatin (CRESTOR) 20 MG tablet Take 1 tablet (20 mg total) by mouth daily. (Patient taking differently: Take 20 mg by mouth daily with supper.)   [DISCONTINUED] olmesartan (BENICAR) 20 MG tablet Take 20 mg by mouth daily.    Allergies:   Atorvastatin, Diovan [valsartan], Iodinated contrast media, Lotensin [benazepril], Sertraline, Sulfa antibiotics, Sulfasalazine, and Penicillins   Social History   Tobacco Use   Smoking status: Former    Packs/day: 1.00    Years: 8.00    Pack years: 8.00    Types: Cigarettes    Quit date: 11/10/1977    Years since quitting: 43.6   Smokeless tobacco: Never  Vaping Use   Vaping Use: Never used  Substance Use Topics   Alcohol use: Yes    Alcohol/week: 1.0 standard drink    Types: 1 Glasses of wine per week   Drug use: No    Family Hx: The patient's Family history is unknown by patient.  Review of Systems  Constitutional: Negative for fever.  Respiratory:  Negative for cough.   Gastrointestinal:  Negative for diarrhea, hematochezia and vomiting.  Genitourinary:  Negative for hematuria.    EKGs/Labs/Other Test Reviewed:    EKG:  EKG is  ordered today.  The ekg ordered today demonstrates NSR, HR 79, left axis deviation, poor wave progression, nonspecific ST-T wave changes, QTc 456  Recent Labs: 06/01/2021: BUN 33; Creatinine, Ser 0.87; Potassium 4.0; Sodium 142   Recent Lipid Panel No results for input(s): CHOL, TRIG, HDL, VLDL, LDLCALC, LDLDIRECT in the last 8760 hours.   Risk Assessment/Calculations:         Physical Exam:     VS:  BP (!) 94/44   Pulse 84   Ht _0  (1.549 m)   Wt 170 lb 6.4 oz (77.3 kg)   SpO2 98%   BMI 32.20 kg/m     Wt Readings from Last 3 Encounters:  06/10/21 170 lb 6.4 oz (77.3 kg)  05/13/21 172 lb (78 kg)  08/12/19 165 lb (74.8 kg)    Constitutional:      Appearance: Healthy appearance. Not in distress.  Neck:     Vascular: No JVR. JVD normal.  Pulmonary:     Effort: Pulmonary effort is normal.     Breath sounds: No wheezing. No rales.  Cardiovascular:     Normal rate. Regular rhythm. Normal S1. Normal S2.      Murmurs: There is no murmur.  Edema:    Peripheral edema absent.  Abdominal:     Palpations: Abdomen is soft.     Tenderness: There is no abdominal tenderness.  Skin:    General: Skin is warm and dry.  Neurological:     Mental Status: Alert and oriented to person, place and time.     Cranial Nerves: Cranial nerves are intact.        ASSESSMENT & PLAN:   CAD (coronary artery disease) Recent CCTA demonstrates significant stenosis in the RCA which is hemodynamically significant by FFR.  Cardiac catheterization has been recommended.  She has stable anginal symptoms (CCS class II-III).  Continue aspirin 81 mg daily, metoprolol succinate 25 mg daily.  Prescription for as needed nitroglycerin will be given.  We discussed how to use this and when to go to the emergency room.  Arrange cardiac catheterization later this week.  Essential hypertension Blood pressure has been running somewhat low.  She notes issues with fluid gains that she stopped taking HCTZ.  Decrease olmesartan to 10 mg daily.  Continue HCTZ 25 mg daily, Toprol-XL 25 mg daily.  Hyperlipidemia LDL goal <70 Labs obtained through [facility] - personally reviewed and interpreted: 04/14/21: Total cholesterol 205, HDL 56, LDL 107, triglycerides 241  Pravastatin recently changed to rosuvastatin.  Continue rosuvastatin 20 mg daily.  If LDL does not get to goal with this, consider adding ezetimibe versus referral  to MD clinic for PCSK9 inhibitor.         Shared Decision Making/Informed Consent The risks [stroke (1 in 1000), death (1 in 1000), kidney failure [usually temporary] (1 in 500), bleeding (1 in 200), allergic reaction [possibly serious] (1 in 200)], benefits (diagnostic support and management of coronary artery disease) and alternatives of a cardiac catheterization were discussed in detail with Shannon Moore and she is willing to proceed.   Dispo:  Return for Post Procedure Follow Up.   Medication Adjustments/Labs and Tests Ordered: Current medicines are reviewed at length with the patient today.  Concerns regarding medicines are outlined above.  Tests Ordered: Orders Placed This Encounter  Procedures   Basic metabolic panel   CBC   EKG 12-Lead   Medication Changes: Meds ordered this encounter  Medications   olmesartan (BENICAR) 20 MG tablet    Sig: Take 0.5 tablets (10 mg total) by mouth daily.    Dispense:  45 tablet    Refill:  3   nitroGLYCERIN (NITROSTAT) 0.4 MG SL tablet    Sig: Place 1 tablet (0.4 mg total) under the tongue every 5 (five) minutes as needed for chest pain.    Dispense:  25 tablet    Refill:  3   Signed, Richardson Dopp, PA-C  06/10/2021 5:41 PM    [facility] [place], [place], [place]  [address] Phone: [phone]; Fax: [phone]

## 2021-06-10 NOTE — Assessment & Plan Note (Signed)
Labs obtained through [place] [facility] - personally reviewed and interpreted: 04/14/21: Total cholesterol 205, HDL 56, LDL 107, triglycerides 241  Pravastatin recently changed to rosuvastatin.  Continue rosuvastatin 20 mg daily.  If LDL does not get to goal with this, consider adding ezetimibe versus referral to MD clinic for PCSK9 inhibitor.

## 2021-06-11 LAB — CBC
Hematocrit: 37.9 % (ref 34.0–46.6)
Hemoglobin: 12.9 g/dL (ref 11.1–15.9)
MCH: 30.9 pg (ref 26.6–33.0)
MCHC: 34 g/dL (ref 31.5–35.7)
MCV: 91 fL (ref 79–97)
Platelets: 301 10*3/uL (ref 150–450)
RBC: 4.18 x10E6/uL (ref 3.77–5.28)
RDW: 13.1 % (ref 11.7–15.4)
WBC: 9.9 10*3/uL (ref 3.4–10.8)

## 2021-06-11 LAB — BASIC METABOLIC PANEL
BUN/Creatinine Ratio: 29 — ABNORMAL HIGH (ref 12–28)
BUN: 29 mg/dL — ABNORMAL HIGH (ref 8–27)
CO2: 26 mmol/L (ref 20–29)
Calcium: 10.1 mg/dL (ref 8.7–10.3)
Chloride: 101 mmol/L (ref 96–106)
Creatinine, Ser: 0.99 mg/dL (ref 0.57–1.00)
Glucose: 161 mg/dL — ABNORMAL HIGH (ref 70–99)
Potassium: 3.9 mmol/L (ref 3.5–5.2)
Sodium: 142 mmol/L (ref 134–144)
eGFR: 59 mL/min/{1.73_m2} — ABNORMAL LOW (ref >59)

## 2021-06-12 ENCOUNTER — Other Ambulatory Visit: Payer: Self-pay

## 2021-06-12 ENCOUNTER — Encounter (HOSPITAL_COMMUNITY)
Admission: RE | Disposition: A | Discharge status: Home / Self Care | Payer: Self-pay | Source: Ambulatory Visit | Attending: Cardiology

## 2021-06-12 ENCOUNTER — Encounter (HOSPITAL_COMMUNITY): Payer: Self-pay | Admitting: Cardiology

## 2021-06-12 ENCOUNTER — Ambulatory Visit (HOSPITAL_COMMUNITY)
Admission: RE | Admit: 2021-06-12 | Discharge: 2021-06-13 | Disposition: A | Discharge status: Home / Self Care | Payer: Medicare HMO | Source: Ambulatory Visit | Attending: Cardiology | Admitting: Cardiology

## 2021-06-12 DIAGNOSIS — I110 Hypertensive heart disease with heart failure: Secondary | ICD-10-CM | POA: Diagnosis not present

## 2021-06-12 DIAGNOSIS — Z7982 Long term (current) use of aspirin: Secondary | ICD-10-CM | POA: Insufficient documentation

## 2021-06-12 DIAGNOSIS — K219 Gastro-esophageal reflux disease without esophagitis: Secondary | ICD-10-CM | POA: Insufficient documentation

## 2021-06-12 DIAGNOSIS — R0609 Other forms of dyspnea: Secondary | ICD-10-CM

## 2021-06-12 DIAGNOSIS — I5030 Unspecified diastolic (congestive) heart failure: Secondary | ICD-10-CM | POA: Insufficient documentation

## 2021-06-12 DIAGNOSIS — E785 Hyperlipidemia, unspecified: Secondary | ICD-10-CM | POA: Diagnosis not present

## 2021-06-12 DIAGNOSIS — R931 Abnormal findings on diagnostic imaging of heart and coronary circulation: Secondary | ICD-10-CM | POA: Diagnosis not present

## 2021-06-12 DIAGNOSIS — I25118 Atherosclerotic heart disease of native coronary artery with other forms of angina pectoris: Secondary | ICD-10-CM | POA: Insufficient documentation

## 2021-06-12 DIAGNOSIS — Z87891 Personal history of nicotine dependence: Secondary | ICD-10-CM | POA: Insufficient documentation

## 2021-06-12 DIAGNOSIS — Z79899 Other long term (current) drug therapy: Secondary | ICD-10-CM | POA: Insufficient documentation

## 2021-06-12 DIAGNOSIS — E1142 Type 2 diabetes mellitus with diabetic polyneuropathy: Secondary | ICD-10-CM | POA: Diagnosis not present

## 2021-06-12 DIAGNOSIS — I25119 Atherosclerotic heart disease of native coronary artery with unspecified angina pectoris: Secondary | ICD-10-CM | POA: Diagnosis not present

## 2021-06-12 DIAGNOSIS — Z7984 Long term (current) use of oral hypoglycemic drugs: Secondary | ICD-10-CM | POA: Diagnosis not present

## 2021-06-12 HISTORY — PX: CORONARY STENT INTERVENTION: CATH118234

## 2021-06-12 HISTORY — PX: LEFT HEART CATH AND CORONARY ANGIOGRAPHY: CATH118249

## 2021-06-12 HISTORY — DX: Atherosclerotic heart disease of native coronary artery without angina pectoris: I25.10

## 2021-06-12 LAB — GLUCOSE, CAPILLARY
Glucose-Capillary: 178 mg/dL — ABNORMAL HIGH (ref 70–99)
Glucose-Capillary: 121 mg/dL — ABNORMAL HIGH (ref 70–99)
Glucose-Capillary: 142 mg/dL — ABNORMAL HIGH (ref 70–99)

## 2021-06-12 LAB — POCT ACTIVATED CLOTTING TIME: Activated Clotting Time: 275 seconds

## 2021-06-12 LAB — HEMOGLOBIN A1C
Hgb A1c MFr Bld: 7 % — ABNORMAL HIGH (ref 4.8–5.6)
Mean Plasma Glucose: 154.2 mg/dL

## 2021-06-12 SURGERY — LEFT HEART CATH AND CORONARY ANGIOGRAPHY
Anesthesia: LOCAL

## 2021-06-12 MED ORDER — LIDOCAINE HCL (PF) 1 % IJ SOLN
INTRAMUSCULAR | Status: AC
  Filled 2021-06-12: qty 30

## 2021-06-12 MED ORDER — LORATADINE 10 MG PO TABS
10.0000 mg | ORAL_TABLET | Freq: Every day | ORAL | Status: DC
  Administered 2021-06-13: 10 mg via ORAL
  Filled 2021-06-12: qty 1

## 2021-06-12 MED ORDER — SODIUM CHLORIDE 0.9 % WEIGHT BASED INFUSION
3.0000 mL/kg/h | INTRAVENOUS | Status: DC
  Administered 2021-06-12: 3 mL/kg/h via INTRAVENOUS

## 2021-06-12 MED ORDER — NITROGLYCERIN 1 MG/10 ML FOR IR/CATH LAB
INTRA_ARTERIAL | Status: DC | PRN
  Administered 2021-06-12: 200 ug via INTRACORONARY

## 2021-06-12 MED ORDER — HEPARIN (PORCINE) IN NACL 1000-0.9 UT/500ML-% IV SOLN
INTRAVENOUS | Status: DC | PRN
  Administered 2021-06-12 (×2): 500 mL

## 2021-06-12 MED ORDER — B COMPLEX-C PO TABS
1.0000 | ORAL_TABLET | Freq: Every day | ORAL | Status: DC
  Filled 2021-06-12 (×2): qty 1

## 2021-06-12 MED ORDER — ASPIRIN 81 MG PO TBEC
81.0000 mg | DELAYED_RELEASE_TABLET | Freq: Every day | ORAL | Status: DC
  Administered 2021-06-13: 81 mg via ORAL
  Filled 2021-06-12: qty 1

## 2021-06-12 MED ORDER — TICAGRELOR 90 MG PO TABS
ORAL_TABLET | ORAL | Status: AC
  Filled 2021-06-12: qty 1

## 2021-06-12 MED ORDER — SODIUM CHLORIDE 0.9 % WEIGHT BASED INFUSION
1.0000 mL/kg/h | INTRAVENOUS | Status: AC
  Administered 2021-06-12 (×2): 1 mL/kg/h via INTRAVENOUS

## 2021-06-12 MED ORDER — METOPROLOL SUCCINATE ER 25 MG PO TB24
25.0000 mg | ORAL_TABLET | Freq: Every day | ORAL | Status: DC
  Administered 2021-06-12: 25 mg via ORAL
  Filled 2021-06-12: qty 1

## 2021-06-12 MED ORDER — ACETAMINOPHEN 325 MG PO TABS
650.0000 mg | ORAL_TABLET | ORAL | Status: DC | PRN
Start: 2021-06-12 — End: 2021-06-13

## 2021-06-12 MED ORDER — HYDROCHLOROTHIAZIDE 25 MG PO TABS
25.0000 mg | ORAL_TABLET | Freq: Every morning | ORAL | Status: DC
  Filled 2021-06-12: qty 1

## 2021-06-12 MED ORDER — HEPARIN (PORCINE) IN NACL 1000-0.9 UT/500ML-% IV SOLN
INTRAVENOUS | Status: AC
  Filled 2021-06-12: qty 1000

## 2021-06-12 MED ORDER — SODIUM CHLORIDE 0.9% FLUSH: 3.0000 mL | INTRAVENOUS | Status: DC | PRN

## 2021-06-12 MED ORDER — MIDAZOLAM HCL 2 MG/2ML IJ SOLN
INTRAMUSCULAR | Status: AC
  Filled 2021-06-12: qty 2

## 2021-06-12 MED ORDER — ADULT MULTIVITAMIN W/MINERALS CH
1.0000 | ORAL_TABLET | Freq: Every day | ORAL | Status: DC
  Administered 2021-06-13: 1 via ORAL

## 2021-06-12 MED ORDER — FENTANYL CITRATE (PF) 100 MCG/2ML IJ SOLN
INTRAMUSCULAR | Status: DC | PRN
Start: 2021-06-12 — End: 2021-06-12
  Administered 2021-06-12: 25 ug via INTRAVENOUS

## 2021-06-12 MED ORDER — TICAGRELOR 90 MG PO TABS
ORAL_TABLET | ORAL | Status: AC
Start: 2021-06-12 — End: ?
  Filled 2021-06-12: qty 1

## 2021-06-12 MED ORDER — FLUTICASONE PROPIONATE 50 MCG/ACT NA SUSP: 2.0000 | Freq: Every day | NASAL | Status: DC | PRN

## 2021-06-12 MED ORDER — TICAGRELOR 90 MG PO TABS
90.0000 mg | ORAL_TABLET | Freq: Two times a day (BID) | ORAL | Status: DC
  Administered 2021-06-13: 90 mg via ORAL
  Filled 2021-06-12: qty 1

## 2021-06-12 MED ORDER — VERAPAMIL HCL 2.5 MG/ML IV SOLN
INTRAVENOUS | Status: DC | PRN
  Administered 2021-06-12: 10 mL via INTRA_ARTERIAL

## 2021-06-12 MED ORDER — GLUCOSAMINE-CHONDROITIN PO CAPS
1.0000 | ORAL_CAPSULE | Freq: Two times a day (BID) | ORAL | Status: DC
Start: 2021-06-12 — End: 2021-06-12

## 2021-06-12 MED ORDER — ALLOPURINOL 300 MG PO TABS
300.0000 mg | ORAL_TABLET | Freq: Every day | ORAL | Status: DC
  Administered 2021-06-12: 300 mg via ORAL
  Filled 2021-06-12: qty 1

## 2021-06-12 MED ORDER — VITAMIN D 25 MCG (1000 UNIT) PO TABS: 2000.0000 [IU] | ORAL_TABLET | Freq: Every day | ORAL | Status: DC

## 2021-06-12 MED ORDER — TICAGRELOR 90 MG PO TABS
ORAL_TABLET | ORAL | Status: DC | PRN
  Administered 2021-06-12: 180 mg via ORAL

## 2021-06-12 MED ORDER — ROSUVASTATIN CALCIUM 20 MG PO TABS
20.0000 mg | ORAL_TABLET | Freq: Every day | ORAL | Status: DC
  Administered 2021-06-12: 20 mg via ORAL
  Filled 2021-06-12: qty 1

## 2021-06-12 MED ORDER — MIDAZOLAM HCL 2 MG/2ML IJ SOLN
INTRAMUSCULAR | Status: DC | PRN
  Administered 2021-06-12: 2 mg via INTRAVENOUS

## 2021-06-12 MED ORDER — GABAPENTIN 300 MG PO CAPS
300.0000 mg | ORAL_CAPSULE | Freq: Every day | ORAL | Status: DC
  Administered 2021-06-12: 300 mg via ORAL
  Filled 2021-06-12: qty 1

## 2021-06-12 MED ORDER — FENTANYL CITRATE (PF) 100 MCG/2ML IJ SOLN
INTRAMUSCULAR | Status: AC
  Filled 2021-06-12: qty 2

## 2021-06-12 MED ORDER — NITROGLYCERIN 0.4 MG SL SUBL
0.4000 mg | SUBLINGUAL_TABLET | SUBLINGUAL | Status: DC | PRN
Start: 2021-06-12 — End: 2021-06-13

## 2021-06-12 MED ORDER — LIDOCAINE HCL (PF) 1 % IJ SOLN
INTRAMUSCULAR | Status: DC | PRN
  Administered 2021-06-12: 2 mL

## 2021-06-12 MED ORDER — ASPIRIN 81 MG PO CHEW: 81.0000 mg | CHEWABLE_TABLET | ORAL | Status: DC

## 2021-06-12 MED ORDER — NITROGLYCERIN 1 MG/10 ML FOR IR/CATH LAB
INTRA_ARTERIAL | Status: AC
  Filled 2021-06-12: qty 10

## 2021-06-12 MED ORDER — HEPARIN (PORCINE) IN NACL 2000-0.9 UNIT/L-% IV SOLN
INTRAVENOUS | Status: AC
Start: 2021-06-12 — End: ?
  Filled 2021-06-12: qty 1000

## 2021-06-12 MED ORDER — IRBESARTAN 75 MG PO TABS
75.0000 mg | ORAL_TABLET | Freq: Every day | ORAL | Status: DC
  Administered 2021-06-13: 75 mg via ORAL
  Filled 2021-06-12: qty 1

## 2021-06-12 MED ORDER — INSULIN ASPART 100 UNIT/ML IJ SOLN
0.0000 [IU] | Freq: Three times a day (TID) | INTRAMUSCULAR | Status: DC
  Administered 2021-06-13: 2 [IU] via SUBCUTANEOUS

## 2021-06-12 MED ORDER — VERAPAMIL HCL 2.5 MG/ML IV SOLN
INTRAVENOUS | Status: AC
  Filled 2021-06-12: qty 2

## 2021-06-12 MED ORDER — ONDANSETRON HCL 4 MG/2ML IJ SOLN: 4.0000 mg | Freq: Four times a day (QID) | INTRAMUSCULAR | Status: DC | PRN

## 2021-06-12 MED ORDER — HEPARIN SODIUM (PORCINE) 1000 UNIT/ML IJ SOLN
INTRAMUSCULAR | Status: DC | PRN
  Administered 2021-06-12 (×2): 4000 [IU] via INTRAVENOUS

## 2021-06-12 MED ORDER — LABETALOL HCL 5 MG/ML IV SOLN: 10.0000 mg | INTRAVENOUS | Status: AC | PRN

## 2021-06-12 MED ORDER — IOHEXOL 350 MG/ML SOLN
INTRAVENOUS | Status: DC | PRN
  Administered 2021-06-12: 80 mL

## 2021-06-12 MED ORDER — SODIUM CHLORIDE 0.9% FLUSH
3.0000 mL | Freq: Two times a day (BID) | INTRAVENOUS | Status: DC
  Administered 2021-06-13 (×2): 3 mL via INTRAVENOUS

## 2021-06-12 MED ORDER — METFORMIN HCL ER 500 MG PO TB24
500.0000 mg | ORAL_TABLET | Freq: Two times a day (BID) | ORAL | Status: DC
  Filled 2021-06-12: qty 1

## 2021-06-12 MED ORDER — INSULIN ASPART 100 UNIT/ML IJ SOLN: 0.0000 [IU] | Freq: Every day | INTRAMUSCULAR | Status: DC

## 2021-06-12 MED ORDER — HYDRALAZINE HCL 20 MG/ML IJ SOLN: 10.0000 mg | INTRAMUSCULAR | Status: AC | PRN

## 2021-06-12 MED ORDER — SODIUM CHLORIDE 0.9 % IV SOLN: 250.0000 mL | INTRAVENOUS | Status: DC | PRN

## 2021-06-12 MED ORDER — SODIUM CHLORIDE 0.9 % WEIGHT BASED INFUSION: 1.0000 mL/kg/h | INTRAVENOUS | Status: DC

## 2021-06-12 MED ORDER — MAGNESIUM GLUCONATE 500 MG PO TABS
500.0000 mg | ORAL_TABLET | Freq: Every day | ORAL | Status: DC
  Filled 2021-06-12: qty 1

## 2021-06-12 SURGICAL SUPPLY — 20 items
BALL SAPPHIRE NC24 3.5X18 (BALLOONS) ×2
BALLN EUPHORA RX 2.5X15 (BALLOONS) ×2
BALLOON EUPHORA RX 2.5X15 (BALLOONS) IMPLANT
BALLOON SAPPHIRE NC24 3.5X18 (BALLOONS) IMPLANT
BAND CMPR LRG ZPHR (HEMOSTASIS) ×1
BAND ZEPHYR COMPRESS 30 LONG (HEMOSTASIS) ×1 IMPLANT
CATH OPTITORQUE TIG 4.0 5F (CATHETERS) ×1 IMPLANT
CATH VISTA GUIDE 6FR JR4 (CATHETERS) ×1 IMPLANT
GLIDESHEATH SLEND SS 6F .021 (SHEATH) ×1 IMPLANT
GUIDEWIRE INQWIRE 1.5J.035X260 (WIRE) IMPLANT
INQWIRE 1.5J .035X260CM (WIRE) ×2
KIT ENCORE 26 ADVANTAGE (KITS) ×1 IMPLANT
KIT HEART LEFT (KITS) ×3 IMPLANT
PACK CARDIAC CATHETERIZATION (CUSTOM PROCEDURE TRAY) ×3 IMPLANT
SHEATH PROBE COVER 6X72 (BAG) ×1 IMPLANT
STENT SYNERGY XD 3.0X20 (Permanent Stent) IMPLANT
SYNERGY XD 3.0X20 (Permanent Stent) ×2 IMPLANT
TRANSDUCER W/STOPCOCK (MISCELLANEOUS) ×3 IMPLANT
TUBING CIL FLEX 10 FLL-RA (TUBING) ×3 IMPLANT
WIRE ASAHI PROWATER 180CM (WIRE) ×1 IMPLANT

## 2021-06-12 NOTE — Progress Notes (Signed)
Patient transferred from cath lab at 1546hrs.  Right wrist radial band in place, level zero.  Patient oriented to unit and plan of care for shift.

## 2021-06-12 NOTE — Brief Op Note (Signed)
   BRIEF CARDIAC CATHETERIZATION & PCI NOTE  06/12/2021 3:36 PM  ID:  Shannon Moore, DOB [birth year], MRN [id number]  PCP:  Harlan Stains, MD        [facility] Providers Cardiologist:  Freada Bergeron, MD  ; Richardson Dopp, PA-C   PROCEDURE:  Procedure(s): LEFT HEART CATH AND CORONARY ANGIOGRAPHY (N/A) CORONARY STENT INTERVENTION (N/A)  SURGEON:  Surgeon(s) and Role:    * Leonie Man, MD - Primary   PATIENT:  Shannon Moore  76 y.o. female with history of HFpEF, DM-2, HTN, HLD seen by Dr. Johney Frame on May 23 for chest discomfort and dyspnea.  She was evaluated with a Coronary CTA and echocardiogram.  Echocardiogram showed normal EF.  CCTA revealed FFR positive proximal RCA lesion (FFR ct 0.67).  Based on high risk findings, she was seen in follow-up by Richardson Dopp, PA on May 31, and scheduled for cardiac catheterization with possible PCI.  PRE-OPERATIVE DIAGNOSIS:  abnormal cardiac CT /dyspnea on exertion  POST-OPERATIVE DIAGNOSIS:   Severe single-vessel CAD: 80% proximal RCA Otherwise minimal CAD in an RCA dominant system Normal LVEF  Time Out: Verified patient identification, verified procedure, site/side was marked, verified correct patient position, special equipment/implants available, medications/allergies/relevent history reviewed, required imaging and test results available. Performed.  Access:  RIGHT Radial Artery: 6 Fr Glide sheath -- Seldinger technique using Short Micropuncture needle -- Direct ultrasound guidance used.  Permanent image obtained and placed on chart. -- 10 mL radial cocktail IA; 4000 Units IV Heparin  Left Heart Catheterization: 5Fr Catheters advanced or exchanged over a J-wire under direct fluoroscopic guidance into the ascending aorta; TIG 4.0 catheter advanced first.  * LV Hemodynamics (LV Gram): TIG 4.0 catheter * Left & Right Coronary Artery Cineangiography: TIG 4.0 catheter   Review of initial angiography revealed: As expected, 80%  proximal RCA lesion as the culprit lesion.  Preparations are made for DES PCI of the RCA -> Additional 4000 units IV heparin administered to achieve and maintain ACT> 250 seconds --> Oral Brilinta 180 mg administered  DES PCI of RCA performed: See FINDINGS --> Synergy XD 3.0 mm x 20 mm-but tapered postdilation 3.6 to 3.2 mm  Upon completion of Angiogaphy, the catheter was removed completely out of the body over a wire, without complication.  Radial sheath removed in the Cardiac Catheterization lab with Zephyr pneumatic band placed for hemostasis.  Zephyr band: 1530  Hours; 12 mL air  MEDICATIONS SQ Lidocaine 3 mL Radial Cocktail: 3 mg Verapmil in 10 mL NS Heparin: Total 8000 units IC NTG 200 mcg x 1 Brilinta 180 mg p.o.  EBL:  < 20 mL   DICTATION: .Note written in EPIC  PLAN OF CARE: Admit for overnight observation  PATIENT DISPOSITION:  PACU - hemodynamically stable.   Delay start of Pharmacological VTE agent (>24hrs) due to surgical blood loss or risk of bleeding: not applicable   [name], MD

## 2021-06-12 NOTE — Interval H&P Note (Signed)
History and Physical Interval Note:  06/12/2021 2:37 PM  Shannon Moore  has presented today for surgery, with the diagnosis of abnormal cardiac CT.  The various methods of treatment have been discussed with the patient and family. After consideration of risks, benefits and other options for treatment, the patient has consented to  Procedure(s): LEFT HEART CATH AND CORONARY ANGIOGRAPHY (N/A)  PERCUTANEOUS CORONARY INTERVENTION  as a surgical intervention.  The patient's history has been reviewed, patient examined, no change in status, stable for surgery.  I have reviewed the patient's chart and labs.  Questions were answered to the patient's satisfaction.     Cath Lab Visit (complete for each Cath Lab visit)  Clinical Evaluation Leading to the Procedure:   ACS: No.  Non-ACS:    Anginal Classification: CCS III  Anti-ischemic medical therapy: Minimal Therapy (1 class of medications)  Non-Invasive Test Results: High-risk stress test findings: cardiac mortality >3%/year  Prior CABG: No previous CABG     Shannon Moore

## 2021-06-13 ENCOUNTER — Encounter (HOSPITAL_COMMUNITY): Payer: Self-pay | Admitting: Cardiology

## 2021-06-13 DIAGNOSIS — Z87891 Personal history of nicotine dependence: Secondary | ICD-10-CM | POA: Diagnosis not present

## 2021-06-13 DIAGNOSIS — R0609 Other forms of dyspnea: Secondary | ICD-10-CM

## 2021-06-13 DIAGNOSIS — I25119 Atherosclerotic heart disease of native coronary artery with unspecified angina pectoris: Secondary | ICD-10-CM | POA: Diagnosis not present

## 2021-06-13 DIAGNOSIS — E1142 Type 2 diabetes mellitus with diabetic polyneuropathy: Secondary | ICD-10-CM | POA: Diagnosis not present

## 2021-06-13 DIAGNOSIS — E785 Hyperlipidemia, unspecified: Secondary | ICD-10-CM | POA: Diagnosis not present

## 2021-06-13 DIAGNOSIS — K219 Gastro-esophageal reflux disease without esophagitis: Secondary | ICD-10-CM | POA: Diagnosis not present

## 2021-06-13 DIAGNOSIS — I25118 Atherosclerotic heart disease of native coronary artery with other forms of angina pectoris: Secondary | ICD-10-CM | POA: Diagnosis not present

## 2021-06-13 DIAGNOSIS — I5030 Unspecified diastolic (congestive) heart failure: Secondary | ICD-10-CM | POA: Diagnosis not present

## 2021-06-13 DIAGNOSIS — R931 Abnormal findings on diagnostic imaging of heart and coronary circulation: Secondary | ICD-10-CM

## 2021-06-13 DIAGNOSIS — I110 Hypertensive heart disease with heart failure: Secondary | ICD-10-CM | POA: Diagnosis not present

## 2021-06-13 LAB — CBC
HCT: 34.5 % — ABNORMAL LOW (ref 36.0–46.0)
Hemoglobin: 11.6 g/dL — ABNORMAL LOW (ref 12.0–15.0)
MCH: 31 pg (ref 26.0–34.0)
MCHC: 33.6 g/dL (ref 30.0–36.0)
MCV: 92.2 fL (ref 80.0–100.0)
Platelets: 237 10*3/uL (ref 150–400)
RBC: 3.74 MIL/uL — ABNORMAL LOW (ref 3.87–5.11)
RDW: 13.5 % (ref 11.5–15.5)
WBC: 8.2 10*3/uL (ref 4.0–10.5)
nRBC: 0 % (ref 0.0–0.2)

## 2021-06-13 LAB — BASIC METABOLIC PANEL
Anion gap: 8 (ref 5–15)
BUN: 15 mg/dL (ref 8–23)
CO2: 25 mmol/L (ref 22–32)
Calcium: 8.7 mg/dL — ABNORMAL LOW (ref 8.9–10.3)
Chloride: 111 mmol/L (ref 98–111)
Creatinine, Ser: 0.74 mg/dL (ref 0.44–1.00)
GFR, Estimated: >60 mL/min (ref >60)
Glucose, Bld: 120 mg/dL — ABNORMAL HIGH (ref 70–99)
Potassium: 3.6 mmol/L (ref 3.5–5.1)
Sodium: 144 mmol/L (ref 135–145)

## 2021-06-13 LAB — GLUCOSE, CAPILLARY: Glucose-Capillary: 166 mg/dL — ABNORMAL HIGH (ref 70–99)

## 2021-06-13 MED ORDER — TICAGRELOR 90 MG PO TABS: 90.0000 mg | ORAL_TABLET | Freq: Two times a day (BID) | ORAL | 0 refills | Status: DC

## 2021-06-13 MED ORDER — BIOTIN 10000 MCG PO TABS: 10000.0000 ug | ORAL_TABLET | Freq: Every day | ORAL | Status: AC

## 2021-06-13 MED ORDER — TICAGRELOR 90 MG PO TABS
90.0000 mg | ORAL_TABLET | Freq: Two times a day (BID) | ORAL | 11 refills | Status: DC
Start: 2021-06-13 — End: 2021-07-06

## 2021-06-13 NOTE — Discharge Summary (Addendum)
Discharge Summary    Patient ID: Shannon Moore MRN: [id number]; DOB: 1945/04/05  Admit date: 06/12/2021 Discharge date: 06/13/2021  PCP:  Harlan Stains, MD   [facility] Providers Cardiologist:  Freada Bergeron, MD        Discharge Diagnoses    Principal Problem:   Coronary artery disease involving native coronary artery of native heart with angina pectoris [facility]) Active Problems:   DOE (dyspnea on exertion)   Abnormal cardiac CT angiography   Diagnostic Studies/Procedures   Cardiac Catheterization: 06/12/2021 Culprit lesion: Prox RCA lesion is 80% stenosed.   A drug-eluting stent was successfully placed using a SYNERGY XD 3.0X20.  Postdilated from 3.6-3.1   Post intervention, there is a 0% residual stenosis.   LV end diastolic pressure is normal.   There is no aortic valve stenosis.   POST-OPERATIVE DIAGNOSIS:   Severe single-vessel CAD: 80% proximal RCA Successful DES PCI of proximal RCA using Synergy XD 3.0 mm x 20 mm postdilated in tapered fashion from 3.6 to 3.1 mm. TIMI-3 flow pre and post.  80% reduced to 0%. Otherwise minimal CAD in an RCA dominant system Normal LVEF     RECOMMENDATIONS Overnight monitoring post PCI DAPT per recommendations  Follow-up with Dr. Sallyanne Kuster   History of Present Illness     Shannon Moore is a 76 y.o. female with past medical history of CAD (mild nonobstructive CAD by cath in 2015), HTN, HLD, Type 2 DM and GERD who presented to [facility] on 06/12/2021 for planned cardiac catheterization.   She was examined by Dr. Johney Frame in 05/2021 as a new patient referral and reported more dyspnea on exertion when walking up inclines. A Coronary CT was recommended for further evaluation and this showed significant stenosis along the proximal RCA which was positive by FFR. She met with Richardson Dopp, PA-C and reported some episodes of left-sided chest pain as well and a cardiac catheterization was recommended for definitive evaluation.    Hospital Course     Consultants: None   She presented to [facility] on 06/12/2021 for planned cardiac catheterization and this was performed by Dr. Ellyn Hack and showed single-vessel disease with 80% proximal-RCA stenosis. Underwent PCI/DES placement with a Synergy XD 3.0 mm x 20 mm. Was started on DAPT with ASA and Brilitna.   This morning, she reports feeling well and denies any recurrent chest pain. She has been ambulating around her room and feels that her dyspnea has improved. Maintained NSR overnight with HR in the 60's to 70's.  Follow-up labs show Hgb at 11.6 and platelets at 237. BMET shows Na+ 144, K+ 3.6 and creatinine 0.74. EKG shows NSR, HR 81 with slight ST depression along the inferior leads. She was provided with a 30-day free card for Brilinta and was encouraged to check the cost of this at her pharmacy as she may need to be switched to Plavix as an outpatient if not affordable. Was continue on PTA ASA, Toprol-XL, ARB and statin. She will hold Metformin for 48 hours after her catheterization. She does have previously scheduled follow-up for later this month. Was discharged home in good condition.    Did the patient have an acute coronary syndrome (MI, NSTEMI, STEMI, etc) this admission?:  No                               Did the patient have a percutaneous coronary intervention (stent / angioplasty)?:  Yes.  Cath/PCI Registry Performance & Quality Measures: Aspirin prescribed? - Yes ADP Receptor Inhibitor (Plavix/Clopidogrel, Brilinta/Ticagrelor or Effient/Prasugrel) prescribed (includes medically managed patients)? - Yes High Intensity Statin (Lipitor 40-58m or Crestor 20-43m prescribed? - Yes For EF <40%, was ACEI/ARB prescribed? - Not Applicable (EF >/= 4024%For EF <40%, Aldosterone Antagonist (Spironolactone or Eplerenone) prescribed? - Not Applicable (EF >/= 4026%Cardiac Rehab Phase II ordered? - Yes       _____________  Discharge Physical Exam and Vitals Blood  pressure (!) 157/67, pulse 73, temperature 98.7 F (37.1 C), temperature source Oral, resp. rate 16, height 5' 1" (1.549 m), weight 76.7 kg, SpO2 97 %.  Filed Weights   06/12/21 1010  Weight: 76.7 kg   General: Pleasant female appearing in no NAD Psych: Normal affect. Neuro: Alert and oriented X 3. Moves all extremities spontaneously. HEENT: Normal  Neck: Supple without bruits or JVD. Lungs:  Resp regular and unlabored, CTA without wheezing or rales. Heart: RRR no s3, s4, or murmurs. Abdomen: Soft, non-tender, non-distended, BS + x 4.  Extremities: No clubbing, cyanosis or edema. DP/PT/Radials 2+ and equal bilaterally. Radial site with mild ecchymosis but no evidence of a hematoma.   Labs & Radiologic Studies    CBC Recent Labs    06/10/21 1552 06/13/21 0157  WBC 9.9 8.2  HGB 12.9 11.6*  HCT 37.9 34.5*  MCV 91 92.2  PLT 301 23834 Basic Metabolic Panel Recent Labs    06/10/21 1552 06/13/21 0157  NA 142 144  K 3.9 3.6  CL 101 111  CO2 26 25  GLUCOSE 161* 120*  BUN 29* 15  CREATININE 0.99 0.74  CALCIUM 10.1 8.7*   Liver Function Tests No results for input(s): AST, ALT, ALKPHOS, BILITOT, PROT, ALBUMIN in the last 72 hours. No results for input(s): LIPASE, AMYLASE in the last 72 hours. High Sensitivity Troponin:   No results for input(s): TROPONINIHS in the last 720 hours.  BNP Invalid input(s): POCBNP D-Dimer No results for input(s): DDIMER in the last 72 hours. Hemoglobin A1C Recent Labs    06/12/21 1811  HGBA1C 7.0*   Fasting Lipid Panel No results for input(s): CHOL, HDL, LDLCALC, TRIG, CHOLHDL, LDLDIRECT in the last 72 hours. Thyroid Function Tests No results for input(s): TSH, T4TOTAL, T3FREE, THYROIDAB in the last 72 hours.  Invalid input(s): FREET3 _____________  CT CORONARY MORPH W/CTA COR W/SCORE W/CA W/CM &/OR WO/CM  Addendum Date: 06/02/2021   ADDENDUM REPORT: 06/02/2021 14:56 HISTORY: Chest pain, nonspecific EXAM: Cardiac/Coronary CT  TECHNIQUE: The patient was scanned on a [employer]: A 100 kV prospective scan was triggered in the descending thoracic aorta at 111 HU's. Axial non-contrast 3 mm slices were carried out through the heart. The data set was analyzed on a dedicated work station and scored using the Agatston method. Gantry rotation speed was 250 msecs and collimation was .6 mm. Heart rate was optimized medically and sl NTG was given. The 3D data set was reconstructed in 5% intervals of the 35-75 % of the R-R cycle. Systolic and diastolic phases were analyzed on a dedicated work station using MPR, MIP and VRT modes. The patient received 10067mMNIPAQUE IOHEXOL 350 MG/ML SOLN of contrast. FINDINGS: Coronary calcium score: The patient's coronary artery calcium score is 287, which places the patient in the 75th percentile. Coronary arteries: Normal coronary origins.  Right dominance. Right Coronary Artery: Normal caliber vessel, gives rise to PDA. There is predominantly noncalcified plaque in the proximal RCA with 70-99% stenosis.  Left Main Coronary Artery: Normal caliber vessel. Ostial mixed plaque with 1-10% stenosis. Left Anterior Descending Coronary Artery: Normal caliber vessel. Scattered mixed calcified and noncalcified plaque in proximal LAD with 25-49% stenosis. Gives rise to large first and small second diagonal branches. Left Circumflex Artery: Normal caliber vessel. Predominantly calcified plaque in proximal LCx with 25-49% stenosis. Gives rise to 3 OM branches. Aorta: Normal size, 30 mm at the mid ascending aorta (level of the PA bifurcation) measured double oblique. Aortic atherosclerosis. No dissection seen in visualized portions of the aorta. Aortic Valve: Scattered calcifications. Trileaflet. Other findings: Normal pulmonary vein drainage into the left atrium. Normal left atrial appendage without a thrombus. Normal size of the pulmonary artery. Normal appearance of the pericardium. Mitral annular  calcification. Possible small PFO without evidence of shunting. IMPRESSION: 1. 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 CT FFR will be performed and reported separately. 2. Coronary calcium score of 287. This was 75th percentile for age and sex matched control. 3. Normal coronary origin with right dominance. 4.  Aortic atherosclerosis. INTERPRETATION: 1. CAD-RADS 0: No evidence of CAD (0%). Consider non-atherosclerotic causes of chest pain. 2. CAD-RADS 1: Minimal non-obstructive CAD (0-24%). Consider non-atherosclerotic causes of chest pain. Consider preventive therapy and risk factor modification. 3. CAD-RADS 2: Mild non-obstructive CAD (25-49%). Consider non-atherosclerotic causes of chest pain. Consider preventive therapy and risk factor modification. 4. CAD-RADS 3: Moderate stenosis (50-69%). Consider symptom-guided anti-ischemic pharmacotherapy as well as risk factor modification per guideline directed care. Additional analysis with CT FFR will be submitted. 5. CAD-RADS 4: Severe stenosis. (70-99% or > 50% left main). Cardiac catheterization or CT FFR is recommended. Consider symptom-guided anti-ischemic pharmacotherapy as well as risk factor modification per guideline directed care. Invasive coronary angiography recommended with revascularization per published guideline statements. 6. CAD-RADS 5: Total coronary occlusion (100%). Consider cardiac catheterization or viability assessment. Consider symptom-guided anti-ischemic pharmacotherapy as well as risk factor modification per guideline directed care. 7. CAD-RADS N: Non-diagnostic study. Obstructive CAD can't be excluded. Alternative evaluation is recommended. Electronically Signed   By: Buford Dresser M.D.   On: 06/02/2021 14:56   Result Date: 06/02/2021 EXAM: OVER-READ INTERPRETATION  CT CHEST The following report is a limited chest CT over-read performed by radiologist Dr. Maurine Simmering of [facility], [place] on 06/02/2021. This over-read does not include  interpretation of cardiac or coronary anatomy or pathology. The coronary CTA interpretation by the cardiologist is attached. COMPARISON:  CT chest 08/12/2019 FINDINGS: Vascular: Coronary artery, aortic valve, and mitral annular calcifications. And no significant extra cardiovascular findings. Mediastinum/Nodes: No lymphadenopathy. Lungs/Pleura: No focal airspace disease. No suspicious pulmonary nodules. Left basilar atelectasis. Upper Abdomen: No acute abnormality. Musculoskeletal: No acute osseous abnormality. No suspicious lytic or blastic lesions. Stair step artifact of the lower sternum. IMPRESSION: No acute or significant incidental extracardiac findings in the chest. Interpretation of cardiac/coronary anatomy by cardiology to follow. Electronically Signed: By: Maurine Simmering M.D. On: 06/02/2021 13:03   CT CORONARY FRACTIONAL FLOW RESERVE DATA PREP  Result Date: 06/02/2021 EXAM: CT FFR ANALYSIS CLINICAL DATA:  chest pain FINDINGS: FFRct analysis was performed on the original cardiac CT angiogram dataset. Diagrammatic representation of the FFRct analysis is provided in a separate PDF document in PACS. This dictation was created using the PDF document and an interactive 3D model of the results. 3D model is not available in the EMR/PACS. Normal FFR range is >0.80. 1. Left Main:  No significant stenosis. FFR = 0.99 2. LAD: No significant stenosis. Proximal FFR = 0.98, Mid  FFR = 0.95, Distal FFR = 0.89 3. LCX: No significant stenosis. Proximal FFR = 0.94, Distal FFR = 0.93 4. RCA: No significant stenosis. Ostial FFR = 0.99, FFR after proximal stenosis = 0.67 IMPRESSION: 1.  CT FFR analysis shows significant stenosis in the proximal RCA. Electronically Signed   By: Buford Dresser M.D.   On: 06/02/2021 16:54   ECHOCARDIOGRAM COMPLETE  Result Date: 05/18/2021    ECHOCARDIOGRAM REPORT   Patient Name:   JANAT TABBERT Date of Exam: 05/18/2021 Medical Rec #:  [id number]      Height:       61.0 in Accession #:     [id number]     Weight:       172.0 lb Date of Birth:  11/11/1945      BSA:          1.771 m Patient Age:    6 years       BP:           136/76 mmHg Patient Gender: F              HR:           68 bpm. Exam Location:  [address] Procedure: 2D Echo, Cardiac Doppler, Color Doppler and Intracardiac            Opacification Agent Indications:    I50.9* Heart failure (unspecified)  History:        Patient has no prior history of Echocardiogram examinations.                 Abnormal ECG, Signs/Symptoms:Chest Pain and Dyspnea; Risk                 Factors:Hypertension, Diabetes and Dyslipidemia. Papitations.                 Obesity.  Sonographer:    Diamond Nickel RCS Referring Phys: Greer Ee PEMBERTON IMPRESSIONS  1. Left ventricular ejection fraction, by estimation, is 55 to 60%. The left ventricle has normal function. The left ventricle has no regional wall motion abnormalities. Left ventricular diastolic parameters are consistent with Grade I diastolic dysfunction (impaired relaxation).  2. Right ventricular systolic function is normal. The right ventricular size is normal. Tricuspid regurgitation signal is inadequate for assessing PA pressure.  3. The mitral valve is normal in structure. Trivial mitral valve regurgitation. No evidence of mitral stenosis. Moderate mitral annular calcification.  4. The aortic valve is tricuspid. There is mild calcification of the aortic valve. Aortic valve regurgitation is not visualized. No aortic stenosis is present.  5. The inferior vena cava is normal in size with greater than 50% respiratory variability, suggesting right atrial pressure of 3 mmHg. FINDINGS  Left Ventricle: Left ventricular ejection fraction, by estimation, is 55 to 60%. The left ventricle has normal function. The left ventricle has no regional wall motion abnormalities. The left ventricular internal cavity size was normal in size. There is  no left ventricular hypertrophy. Left ventricular diastolic parameters  are consistent with Grade I diastolic dysfunction (impaired relaxation). Right Ventricle: The right ventricular size is normal. No increase in right ventricular wall thickness. Right ventricular systolic function is normal. Tricuspid regurgitation signal is inadequate for assessing PA pressure. Left Atrium: Left atrial size was normal in size. Right Atrium: Right atrial size was normal in size. Pericardium: There is no evidence of pericardial effusion. Mitral Valve: The mitral valve is normal in structure. Moderate mitral annular calcification. Trivial mitral valve regurgitation. No evidence  of mitral valve stenosis. Tricuspid Valve: The tricuspid valve is normal in structure. Tricuspid valve regurgitation is trivial. Aortic Valve: The aortic valve is tricuspid. There is mild calcification of the aortic valve. Aortic valve regurgitation is not visualized. No aortic stenosis is present. Pulmonic Valve: The pulmonic valve was normal in structure. Pulmonic valve regurgitation is not visualized. Aorta: The aortic root is normal in size and structure. Venous: The inferior vena cava is normal in size with greater than 50% respiratory variability, suggesting right atrial pressure of 3 mmHg. IAS/Shunts: No atrial level shunt detected by color flow Doppler.  LEFT VENTRICLE PLAX 2D LVIDd:         4.50 cm   Diastology LVIDs:         2.90 cm   LV e' medial:    6.92 cm/s LV PW:         1.00 cm   LV E/e' medial:  10.1 LV IVS:        0.90 cm   LV e' lateral:   6.75 cm/s LVOT diam:     2.00 cm   LV E/e' lateral: 10.4 LV SV:         71 LV SV Index:   40 LVOT Area:     3.14 cm  RIGHT VENTRICLE RV Basal diam:  3.10 cm RV S prime:     8.32 cm/s TAPSE (M-mode): 1.8 cm LEFT ATRIUM             Index        RIGHT ATRIUM           Index LA diam:        3.40 cm 1.92 cm/m   RA Area:     12.30 cm LA Vol (A2C):   59.9 ml 33.81 ml/m  RA Volume:   28.90 ml  16.31 ml/m LA Vol (A4C):   39.1 ml 22.07 ml/m LA Biplane Vol: 49.6 ml 28.00 ml/m   AORTIC VALVE LVOT Vmax:   85.10 cm/s LVOT Vmean:  55.300 cm/s LVOT VTI:    0.225 m  AORTA Ao Root diam: 2.70 cm Ao Asc diam:  3.10 cm MITRAL VALVE MV Area (PHT): 4.71 cm     SHUNTS MV Decel Time: 161 msec     Systemic VTI:  0.22 m MV E velocity: 70.20 cm/s   Systemic Diam: 2.00 cm MV A velocity: 100.00 cm/s MV E/A ratio:  0.70 Dalton McleanMD Electronically signed by Franki Monte Signature Date/Time: 05/18/2021/1:21:27 PM    Final     Disposition   Pt is being discharged home today in good condition.  Follow-up Plans & Appointments     Follow-up Information     Liliane Shi, PA-C Follow up on 06/29/2021.   Specialties: Cardiology, Physician Assistant Why: Cardiology Hospital Follow-up on 06/29/2021 at 10:55 AM. Contact information: [address] [place] [address] [phone]                Discharge Instructions     AMB Referral to Cardiac Rehabilitation - Phase II   Complete by: As directed    Diagnosis:  Stable Angina Coronary Stents     After initial evaluation and assessments completed: Virtual Based Care may be provided alone or in conjunction with Phase 2 Cardiac Rehab based on patient barriers.: Yes   Diet - low sodium heart healthy   Complete by: As directed    Discharge instructions   Complete by: As directed    PLEASE  REMEMBER TO BRING ALL OF YOUR MEDICATIONS TO EACH OF YOUR FOLLOW-UP OFFICE VISITS.  PLEASE ATTEND ALL SCHEDULED FOLLOW-UP APPOINTMENTS.   Activity: Increase activity slowly as tolerated. You may shower, but no soaking baths (or swimming) for 1 week. No driving for 24 hours. No lifting over 10 lbs for 1 week. No sexual activity for 1 week.   You May Return to Work: in 1 week (if applicable)  Wound Care: You may wash cath site gently with soap and water. Keep cath site clean and dry. If you notice pain, swelling, bleeding or pus at your cath site, please call [phone].   Increase activity slowly   Complete by: As  directed        Discharge Medications   Allergies as of 06/13/2021       Reactions   Atorvastatin Other (See Comments)   Intolerance   Diovan [valsartan] Other (See Comments)   DRY MOUTH   Iodinated Contrast Media Other (See Comments)   DIARRHEA/ liquid only  Iv ok   Lotensin [benazepril]    COUGH   Sertraline Other (See Comments)   Congestive heart failure.   Sulfa Antibiotics Itching   Sulfasalazine Itching   Penicillins Rash   Tolerated Cephalosporin Date: 08/07/19. Has patient had a PCN reaction causing immediate rash, facial/tongue/throat swelling, SOB or lightheadedness with hypotension: Yes Has patient had a PCN reaction causing severe rash involving mucus membranes or skin necrosis: No Has patient had a PCN reaction that required hospitalization: No Has patient had a PCN reaction occurring within the last 10 years: No If all of the above answers are "NO", then may proceed with Cephalosporin use.        Medication List     STOP taking these medications    TRIAMCINOLONE ACETONIDE EX   TURMERIC CURCUMIN PO       TAKE these medications    allopurinol 300 MG tablet Commonly known as: ZYLOPRIM Take 300 mg by mouth daily with supper.   aspirin EC 81 MG tablet 81 mg daily.   b complex vitamins capsule Take 1 capsule by mouth daily.   Biotin 10000 MCG Tabs Take 10,000 mcg by mouth daily. What changed: how much to take   cetirizine 10 MG tablet Commonly known as: ZYRTEC Take 10 mg by mouth daily.   fluticasone 50 MCG/ACT nasal spray Commonly known as: FLONASE Place 2 sprays into both nostrils daily as needed for allergies or rhinitis.   gabapentin 300 MG capsule Commonly known as: NEURONTIN Take 300-600 mg by mouth at bedtime.   Glucosamine-Chondroitin Caps Take 1 capsule by mouth 2 (two) times daily.   hydrochlorothiazide 25 MG tablet Commonly known as: HYDRODIURIL Take 25 mg by mouth every morning.   magnesium gluconate 500 MG  tablet Commonly known as: MAGONATE Take 500 mg by mouth daily with supper.   metFORMIN 500 MG 24 hr tablet Commonly known as: GLUCOPHAGE-XR Take 500 mg by mouth 2 (two) times daily with a meal. Notes to patient: DO NOT RESTART UNTIL 06/15/2021.   metoprolol succinate 25 MG 24 hr tablet Commonly known as: TOPROL-XL Take 25 mg by mouth daily with supper.   multivitamin with minerals tablet Take 1 tablet by mouth daily.   nitroGLYCERIN 0.4 MG SL tablet Commonly known as: NITROSTAT Place 1 tablet (0.4 mg total) under the tongue every 5 (five) minutes as needed for chest pain.   olmesartan 20 MG tablet Commonly known as: Benicar Take 0.5 tablets (10 mg total) by mouth daily.  What changed: when to take this   PROBIOTIC PO Take 1 tablet by mouth daily.   rosuvastatin 20 MG tablet Commonly known as: CRESTOR Take 1 tablet (20 mg total) by mouth daily. What changed: when to take this   TACROLIMUS EX Apply 1 application. topically daily as needed (Psoriasis). Ointment   ticagrelor 90 MG Tabs tablet Commonly known as: BRILINTA Take 1 tablet (90 mg total) by mouth 2 (two) times daily.   Vitamin D3 50 MCG (2000 UT) Tabs Take 2,000 Units by mouth daily with supper.        Outstanding Labs/Studies   FLP and LFT's later this month (following switch to Crestor in 05/2021)  Duration of Discharge Encounter   Greater than 30 minutes including physician time.  Signed, Erma Heritage, PA-C 06/13/2021, 8:22 AM   Patient seen and examined.  Agree with above documentation.  She underwent LHC yesterday which showed severe proximal RCA stenosis status post DES.  She is doing well post PCI.  Creatinine 0.74, hemoglobin 11.6.  Will discharge on aspirin, Brilinta, Toprol-XL, rosuvastatin.    GEN: in no acute distress HEENT: normal Neck: no JVD Cardiac: RRR; no murmurs, rubs, or gallops,no edema  Respiratory:  clear to auscultation bilaterally, normal work of breathing GI: soft,  nontender MS: no deformity or atrophy Skin: warm and dry, no rash Neuro:  Alert and Oriented x 3 Psych: normal affect  Donato Heinz, MD

## 2021-06-13 NOTE — Progress Notes (Signed)
CARDIAC REHAB PHASE I   [phone] Patient ambulating in room and anticipating discharge home today. CR education completed including risk factors, exercise guidelines, angina/nitro, and nutrition including pritikin intensive CR. Patient is provided stent card. Eager to start cardiac rehab as husband has completed program in the past. Phase 2 referral placed to [facility].  Vue Pavon Minus Breeding RN, BSN

## 2021-06-15 ENCOUNTER — Encounter (HOSPITAL_COMMUNITY): Payer: Self-pay | Admitting: Cardiology

## 2021-06-15 LAB — LIPOPROTEIN A (LPA): Lipoprotein (a): 15.9 nmol/L (ref <75.0)

## 2021-06-16 DIAGNOSIS — Z8719 Personal history of other diseases of the digestive system: Secondary | ICD-10-CM | POA: Diagnosis not present

## 2021-06-16 DIAGNOSIS — K589 Irritable bowel syndrome without diarrhea: Secondary | ICD-10-CM | POA: Diagnosis not present

## 2021-06-16 DIAGNOSIS — K219 Gastro-esophageal reflux disease without esophagitis: Secondary | ICD-10-CM | POA: Diagnosis not present

## 2021-06-16 DIAGNOSIS — Z86010 Personal history of colonic polyps: Secondary | ICD-10-CM | POA: Diagnosis not present

## 2021-06-23 ENCOUNTER — Telehealth (HOSPITAL_COMMUNITY): Payer: Self-pay

## 2021-06-23 NOTE — Telephone Encounter (Signed)
Called patient to see if she is interested in the Cardiac Rehab Program. Patient expressed interest. Explained scheduling process and went over insurance, patient verbalized understanding. Will contact patient for scheduling once f/u has been completed. 

## 2021-06-23 NOTE — Telephone Encounter (Signed)
Pt insurance is active and benefits verified through [facility]. Co-pay $10.00, DED $0.00/$0.00 met, out of pocket $3,400.00/$319.15 met, co-insurance 0%. No pre-authorization required. [employer]port, 06/23/21 @ 11:09AM, REF#[id number]   How many CR sessions are covered? (36 sessions for TCR, 72 sessions for ICR)72 Is this a lifetime maximum or an annual maximum? No Has the member used any of these services to date? 0 Is there a time limit (weeks/months) on start of program and/or program completion? No     Will contact patient to see if she is interested in the Cardiac Rehab Program. If interested, patient will need to complete follow up appt. Once completed, patient will be contacted for scheduling upon review by the RN Navigator.

## 2021-06-28 NOTE — Progress Notes (Unsigned)
Cardiology Office Note:    Date:  06/29/2021   ID:  Shannon Moore, DOB 1945-12-10, MRN [id number]  PCP:  Harlan Stains, MD  [facility] Providers Cardiologist:  Freada Bergeron, MD    Referring MD: Harlan Stains, MD   Chief Complaint:  Hospitalization Follow-up (S/p PCI)    Patient Profile: (HFpEF) heart failure with preserved ejection fraction  Echo 05/2021: EF 55-60, GR 1 DD Coronary artery disease  Mild non-obstructive CAD by cath in 2015 CCTA 05/2021: Proximal RCA 70-99 (+ FFR) S/p DES to the proximal RCA 06/2021 Hypertension  Hyperlipidemia  Diabetes mellitus  palpitations  GERD Hx of Bell's Palsy Neuropathy   Prior CV Studies: LEFT HEART CATH 06/12/2021   Culprit lesion: Prox RCA lesion is 80% stenosed.   A drug-eluting stent was successfully placed using a SYNERGY XD 3.0X20.         CCTA 06/02/21 Coronary calcium score:  287,  75th percentile. Right Coronary Artery:  proximal 70-99%  Left Main Coronary Artery: Ostial 1-10%  Left Anterior Descending Coronary Artery:  proximal LAD with 25-49%   Left Circumflex Artery: proximal LCx 25-49%   FFR 1. Left Main:  No significant stenosis. FFR = 0.99 2. LAD: No significant stenosis. Proximal FFR = 0.98, Mid FFR = 0.95, Distal FFR = 0.89 3. LCX: No significant stenosis. Proximal FFR = 0.94, Distal FFR = 0.93 4. RCA: No significant stenosis. Ostial FFR = 0.99, FFR after proximal stenosis = 0.67 IMPRESSION: 1.  CT FFR analysis shows significant stenosis in the proximal RCA.   Echocardiogram 05/18/21 EF 55-60, no RWMA, GR 1 DD, normal RVSF, trivial MR, moderate MAC, mild AV calcification  History of Present Illness:   Shannon Moore is a 76 y.o. female with the above problem list.  She was last seen Jun 10, 2021 to arrange cardiac catheterization.  She had been evaluated for chest pain and a CCTA demonstrated probable hemodynamically significant stenosis in the RCA.  Cardiac catheterization was performed on 06/12/21  and confirmed 80% pRCA lesion which was treated with a DES.  Her post PCI course was uneventful.  She returns for f/u.  She is here alone.  She has not had any further chest discomfort since her PCI.  She does have some shortness of breath from time to time.  She also feels her voice is hoarse.  She does note a history of hand and feet swelling.  This is often worse if she stops taking HCTZ.  She will note increasing shortness of breath if she does not take HCTZ.  She has not had orthopnea, leg edema, syncope.    Past Medical History:  Diagnosis Date   Abnormal mammogram    Adenomatous polyp of colon    Anginal pain ([place])    Arthritis    Hands, Knees hips, feet,    Bell's palsy    CAD (coronary artery disease)    a. s/p DES to prox-RCA in 06/2021 following abnormal Coronary CT   CHF (congestive heart failure) ([place]) 1990s   pt feels it is from Zoloft or a virus   Diabetic peripheral neuropathy (HCC)    Diastolic dysfunction    Diverticulitis    DM (diabetes mellitus) ([place])    Dysphagia    Esophageal obstruction    Essential hypertension 01/08/2014   Female cystocele    GERD (gastroesophageal reflux disease)    Goiter    Gout    History of 2019 novel coronavirus disease (COVID-19)  HTN (hypertension)    Hyperlipidemia 01/08/2014   Migraines    Mixed incontinence urge and stress    Obesity (BMI 30-39.9) 01/08/2014   Osteoarthritis of knee    Thyroid nodule    Type 2 diabetes mellitus without complication ([place]) 09/32/6712   Current Medications: Current Meds  Medication Sig   allopurinol (ZYLOPRIM) 300 MG tablet Take 300 mg by mouth daily with supper.   aspirin EC 81 MG tablet 81 mg daily.   b complex vitamins capsule Take 1 capsule by mouth daily.   Biotin 10000 MCG TABS Take 10,000 mcg by mouth daily.   cetirizine (ZYRTEC) 10 MG tablet Take 10 mg by mouth daily.   Cholecalciferol (VITAMIN D3) 50 MCG (2000 UT) TABS Take 2,000 Units by mouth daily with supper.   fluticasone  (FLONASE) 50 MCG/ACT nasal spray Place 2 sprays into both nostrils daily as needed for allergies or rhinitis.   furosemide (LASIX) 20 MG tablet Take 1 tablet (20 mg total) by mouth every other day. May increase to daily if needed, but let Shannon know if you do   gabapentin (NEURONTIN) 300 MG capsule Take 300-600 mg by mouth at bedtime.   Glucosamine-Chondroit-Vit C-Mn (GLUCOSAMINE-CHONDROITIN) CAPS Take 1 capsule by mouth 2 (two) times daily.   magnesium gluconate (MAGONATE) 500 MG tablet Take 500 mg by mouth daily with supper.   metFORMIN (GLUCOPHAGE-XR) 500 MG 24 hr tablet Take 500 mg by mouth 2 (two) times daily with a meal.   metoprolol succinate (TOPROL-XL) 25 MG 24 hr tablet Take 25 mg by mouth daily with supper.   Multiple Vitamins-Minerals (MULTIVITAMIN WITH MINERALS) tablet Take 1 tablet by mouth daily.   nitroGLYCERIN (NITROSTAT) 0.4 MG SL tablet Place 1 tablet (0.4 mg total) under the tongue every 5 (five) minutes as needed for chest pain.   olmesartan (BENICAR) 20 MG tablet Take 0.5 tablets (10 mg total) by mouth daily.   pantoprazole (PROTONIX) 40 MG tablet Take 40 mg by mouth as needed (for indigestion).   Probiotic Product (PROBIOTIC PO) Take 1 tablet by mouth daily.   TACROLIMUS EX Apply 1 application. topically daily as needed (Psoriasis). Ointment   ticagrelor (BRILINTA) 90 MG TABS tablet Take 1 tablet (90 mg total) by mouth 2 (two) times daily.   tiZANidine (ZANAFLEX) 2 MG tablet Take 2 mg by mouth 3 (three) times daily as needed for muscle spasms.   [DISCONTINUED] hydrochlorothiazide (HYDRODIURIL) 25 MG tablet Take 25 mg by mouth every morning.   [DISCONTINUED] rosuvastatin (CRESTOR) 20 MG tablet Take 1 tablet (20 mg total) by mouth daily.    Allergies:   Atorvastatin, Diovan [valsartan], Iodinated contrast media, Lotensin [benazepril], Sertraline, Sulfa antibiotics, Sulfasalazine, and Penicillins   Social History   Tobacco Use   Smoking status: Former    Packs/day: 1.00     Years: 8.00    Total pack years: 8.00    Types: Cigarettes    Quit date: 11/10/1977    Years since quitting: 43.6   Smokeless tobacco: Never  Vaping Use   Vaping Use: Never used  Substance Use Topics   Alcohol use: Yes    Alcohol/week: 1.0 standard drink of alcohol    Types: 1 Glasses of wine per week   Drug use: No    Family Hx: The patient's Family history is unknown by patient.  Review of Systems  Gastrointestinal:  Negative for hematochezia.  Genitourinary:  Negative for hematuria.     EKGs/Labs/Other Test Reviewed:    EKG:  EKG  is  ordered today.  The ekg ordered today demonstrates NSR, HR 70, left axis deviation, nonspecific ST-T wave changes, QTc 455, similar to prior tracing  Recent Labs: 06/13/2021: BUN 15; Creatinine, Ser 0.74; Hemoglobin 11.6; Platelets 237; Potassium 3.6; Sodium 144   Recent Lipid Panel No results for input(s): "CHOL", "TRIG", "HDL", "VLDL", "LDLCALC", "LDLDIRECT" in the last 8760 hours.   Risk Assessment/Calculations/Metrics:              Physical Exam:    VS:  BP (!) 118/50   Pulse 70   Ht _0  (1.549 m)   Wt 170 lb (77.1 kg)   SpO2 97%   BMI 32.12 kg/m     Wt Readings from Last 3 Encounters:  06/29/21 170 lb (77.1 kg)  06/12/21 169 lb (76.7 kg)  06/10/21 170 lb 6.4 oz (77.3 kg)    Constitutional:      Appearance: Healthy appearance. Not in distress.  Neck:     Vascular: No JVR. JVD normal.  Pulmonary:     Breath sounds: No wheezing. No rales.  Cardiovascular:     Normal rate. Regular rhythm. Normal S1. Normal S2.      Murmurs: There is no murmur.     Comments: R wrist without hematoma Edema:    Peripheral edema absent.  Abdominal:     Palpations: Abdomen is soft.  Skin:    General: Skin is warm and dry.  Neurological:     General: No focal deficit present.     Mental Status: Alert and oriented to person, place and time.         ASSESSMENT & PLAN:   CAD (coronary artery disease) S/p recent DES to pRCA.  She is  doing well without further angina.  She is having some shortness of breath which is likely multifactorial and related to HFpEF, deconditioning and possibly side effects to the Brilinta.  We discussed continuing the Brilinta for now and if she continues to have shortness of breath > 1 month out from her PCI, we could consider changing to Plavix.  She is interested in cardiac rehabilitation. Continue aspirin 81 mg daily, Brilinta 90 mg twice daily, Toprol-XL 25 mg daily, rosuvastatin 20 mg daily Start cardiac rehab once there is availability Follow-up in 36-monthShe knows to contact [place] her breathing does not improve so that we can consider changing Brilinta to Plavix  (HFpEF) heart failure with preserved ejection fraction ([place] She has a prior history of admission for diastolic heart failure.  She has been maintained on hydrochlorothiazide.  She does note occasional episodes of increased swelling.  She also notes worsening shortness of breath if she stops hydrochlorothiazide.  I suspect heart failure with preserved ejection fraction is contributing somewhat to her shortness of breath.  I have recommended changing HCTZ to furosemide to see if this improves her symptoms any. DC HCTZ Start furosemide 20 mg every other day She can increase furosemide to every day if every other day dosing is not adequate BMET 2 weeks  Hyperlipidemia LDL goal <70 Continue rosuvastatin 20 mg daily.  Obtain fasting CMET, lipids today  Essential hypertension Controlled.  Change hydrochlorothiazide to furosemide as noted.  Continue Toprol-XL 25 mg daily, olmesartan 10 mg daily.  Normocytic anemia Precath labs demonstrated a mildly low hemoglobin at 11.6.  MCV normal.  Repeat CBC today.      Cardiac Rehabilitation Eligibility Assessment  The patient is ready to start cardiac rehabilitation from a cardiac standpoint.  Dispo:  Return in about 6 months (around 12/29/2021) for Routine Follow Up, w/ Dr.  Johney Frame, or Richardson Dopp, PA-C.   Medication Adjustments/Labs and Tests Ordered: Current medicines are reviewed at length with the patient today.  Concerns regarding medicines are outlined above.  Tests Ordered: Orders Placed This Encounter  Procedures   Lipid Profile   CBC   Basic metabolic panel   Comprehensive metabolic panel   EKG [id number]   Medication Changes: Meds ordered this encounter  Medications   furosemide (LASIX) 20 MG tablet    Sig: Take 1 tablet (20 mg total) by mouth every other day. May increase to daily if needed, but let Shannon know if you do    Dispense:  45 tablet    Refill:  3   rosuvastatin (CRESTOR) 20 MG tablet    Sig: Take 1 tablet (20 mg total) by mouth daily.    Dispense:  90 tablet    Refill:  3   Signed, Richardson Dopp, PA-C  06/29/2021 12:08 PM    [address], [place], [place]  [address] Phone: [phone]; Fax: [phone]

## 2021-06-29 ENCOUNTER — Ambulatory Visit: Payer: Medicare HMO | Admitting: Physician Assistant

## 2021-06-29 ENCOUNTER — Encounter: Payer: Self-pay | Admitting: Physician Assistant

## 2021-06-29 VITALS — BP 118/50 | HR 70 | Ht 61.0 in | Wt 170.0 lb

## 2021-06-29 DIAGNOSIS — I10 Essential (primary) hypertension: Secondary | ICD-10-CM | POA: Diagnosis not present

## 2021-06-29 DIAGNOSIS — I5032 Chronic diastolic (congestive) heart failure: Secondary | ICD-10-CM | POA: Diagnosis not present

## 2021-06-29 DIAGNOSIS — D649 Anemia, unspecified: Secondary | ICD-10-CM | POA: Insufficient documentation

## 2021-06-29 DIAGNOSIS — I2510 Atherosclerotic heart disease of native coronary artery without angina pectoris: Secondary | ICD-10-CM | POA: Diagnosis not present

## 2021-06-29 DIAGNOSIS — Z79899 Other long term (current) drug therapy: Secondary | ICD-10-CM

## 2021-06-29 DIAGNOSIS — R072 Precordial pain: Secondary | ICD-10-CM | POA: Diagnosis not present

## 2021-06-29 DIAGNOSIS — E785 Hyperlipidemia, unspecified: Secondary | ICD-10-CM

## 2021-06-29 DIAGNOSIS — I5030 Unspecified diastolic (congestive) heart failure: Secondary | ICD-10-CM | POA: Insufficient documentation

## 2021-06-29 LAB — LIPID PANEL
Chol/HDL Ratio: 2.4 ratio (ref 0.0–4.4)
Cholesterol, Total: 120 mg/dL (ref 100–199)
HDL: 49 mg/dL (ref >39)
LDL Chol Calc (NIH): 45 mg/dL (ref 0–99)
Triglycerides: 155 mg/dL — ABNORMAL HIGH (ref 0–149)
VLDL Cholesterol Cal: 26 mg/dL (ref 5–40)

## 2021-06-29 LAB — COMPREHENSIVE METABOLIC PANEL
ALT: 20 IU/L (ref 0–32)
AST: 19 IU/L (ref 0–40)
Albumin/Globulin Ratio: 1.9 (ref 1.2–2.2)
Albumin: 4.4 g/dL (ref 3.7–4.7)
Alkaline Phosphatase: 65 IU/L (ref 44–121)
BUN/Creatinine Ratio: 20 (ref 12–28)
BUN: 18 mg/dL (ref 8–27)
Bilirubin Total: 0.3 mg/dL (ref 0.0–1.2)
CO2: 26 mmol/L (ref 20–29)
Calcium: 10.2 mg/dL (ref 8.7–10.3)
Chloride: 100 mmol/L (ref 96–106)
Creatinine, Ser: 0.91 mg/dL (ref 0.57–1.00)
Globulin, Total: 2.3 g/dL (ref 1.5–4.5)
Glucose: 161 mg/dL — ABNORMAL HIGH (ref 70–99)
Potassium: 4.1 mmol/L (ref 3.5–5.2)
Sodium: 140 mmol/L (ref 134–144)
Total Protein: 6.7 g/dL (ref 6.0–8.5)
eGFR: 65 mL/min/{1.73_m2} (ref >59)

## 2021-06-29 LAB — CBC
Hematocrit: 35.6 % (ref 34.0–46.6)
Hemoglobin: 12.4 g/dL (ref 11.1–15.9)
MCH: 30.9 pg (ref 26.6–33.0)
MCHC: 34.8 g/dL (ref 31.5–35.7)
MCV: 89 fL (ref 79–97)
Platelets: 293 10*3/uL (ref 150–450)
RBC: 4.01 x10E6/uL (ref 3.77–5.28)
RDW: 12.5 % (ref 11.7–15.4)
WBC: 7.4 10*3/uL (ref 3.4–10.8)

## 2021-06-29 MED ORDER — FUROSEMIDE 20 MG PO TABS: 20.0000 mg | ORAL_TABLET | ORAL | 3 refills | Status: DC

## 2021-06-29 MED ORDER — ROSUVASTATIN CALCIUM 20 MG PO TABS: 20.0000 mg | ORAL_TABLET | Freq: Every day | ORAL | 3 refills | Status: AC

## 2021-06-29 NOTE — Assessment & Plan Note (Signed)
Controlled.  Change hydrochlorothiazide to furosemide as noted.  Continue Toprol-XL 25 mg daily, olmesartan 10 mg daily.

## 2021-06-29 NOTE — Assessment & Plan Note (Signed)
Precath labs demonstrated a mildly low hemoglobin at 11.6.  MCV normal.  Repeat CBC today.

## 2021-06-29 NOTE — Assessment & Plan Note (Signed)
She has a prior history of admission for diastolic heart failure.  She has been maintained on hydrochlorothiazide.  She does note occasional episodes of increased swelling.  She also notes worsening shortness of breath if she stops hydrochlorothiazide.  I suspect heart failure with preserved ejection fraction is contributing somewhat to her shortness of breath.  I have recommended changing HCTZ to furosemide to see if this improves her symptoms any.  DC HCTZ  Start furosemide 20 mg every other day  She can increase furosemide to every day if every other day dosing is not adequate  BMET 2 weeks

## 2021-06-29 NOTE — Assessment & Plan Note (Signed)
S/p recent DES to [facility].  She is doing well without further angina.  She is having some shortness of breath which is likely multifactorial and related to HFpEF, deconditioning and possibly side effects to the Brilinta.  We discussed continuing the Brilinta for now and if she continues to have shortness of breath > 1 month out from her PCI, we could consider changing to Plavix.  She is interested in cardiac rehabilitation.  Continue aspirin 81 mg daily, Brilinta 90 mg twice daily, Toprol-XL 25 mg daily, rosuvastatin 20 mg daily  Start cardiac rehab once there is availability  Follow-up in 29-month She knows to contact [place] her breathing does not improve so that we can consider changing Brilinta to Plavix

## 2021-06-29 NOTE — Patient Instructions (Signed)
Medication Instructions:  1.Stop hydrochlorothiazide (HCTZ) 2.Start lasix 20 mg every other day, you may increase to daily if needed but call us and let us know if you change to daily *If you need a refill on your cardiac medications before your next appointment, please call your pharmacy*   Lab Work: 1.Fasting lipids, CMET and CBC today 2.BMET in 2-3 weeks If you have labs (blood work) drawn today and your tests are completely normal, you will receive your results only by: [place] (if you have MyChart) OR A paper copy in the mail If you have any lab test that is abnormal or we need to change your treatment, we will call you to review the results.  Follow-Up: At [facility], you and your health needs are our priority.  As part of our continuing mission to provide you with exceptional heart care, we have created designated Provider Care Teams.  These Care Teams include your primary Cardiologist (physician) and Advanced Practice Providers (APPs -  Physician Assistants and Nurse Practitioners) who all work together to provide you with the care you need, when you need it.  Your next appointment:   6 month(s)  The format for your next appointment:   In Person  Provider:   Freada Bergeron, MD  or Richardson Dopp, PA-C     {  Important Information About Sugar

## 2021-06-29 NOTE — Assessment & Plan Note (Addendum)
Continue rosuvastatin 20 mg daily.  Obtain fasting CMET, lipids today

## 2021-07-06 ENCOUNTER — Other Ambulatory Visit: Payer: Self-pay | Admitting: Student

## 2021-07-06 MED ORDER — TICAGRELOR 90 MG PO TABS: 90.0000 mg | ORAL_TABLET | Freq: Two times a day (BID) | ORAL | 3 refills | Status: DC

## 2021-07-08 ENCOUNTER — Other Ambulatory Visit: Payer: Medicare HMO

## 2021-07-17 ENCOUNTER — Other Ambulatory Visit: Payer: Medicare HMO | Admitting: *Deleted

## 2021-07-17 DIAGNOSIS — E785 Hyperlipidemia, unspecified: Secondary | ICD-10-CM | POA: Diagnosis not present

## 2021-07-17 DIAGNOSIS — I2510 Atherosclerotic heart disease of native coronary artery without angina pectoris: Secondary | ICD-10-CM | POA: Diagnosis not present

## 2021-07-17 DIAGNOSIS — I10 Essential (primary) hypertension: Secondary | ICD-10-CM | POA: Diagnosis not present

## 2021-07-17 LAB — BASIC METABOLIC PANEL
BUN/Creatinine Ratio: 20 (ref 12–28)
BUN: 19 mg/dL (ref 8–27)
CO2: 22 mmol/L (ref 20–29)
Calcium: 9.4 mg/dL (ref 8.7–10.3)
Chloride: 102 mmol/L (ref 96–106)
Creatinine, Ser: 0.93 mg/dL (ref 0.57–1.00)
Glucose: 135 mg/dL — ABNORMAL HIGH (ref 70–99)
Potassium: 4.2 mmol/L (ref 3.5–5.2)
Sodium: 140 mmol/L (ref 134–144)
eGFR: 64 mL/min/{1.73_m2} (ref >59)

## 2021-07-30 DIAGNOSIS — N3946 Mixed incontinence: Secondary | ICD-10-CM | POA: Diagnosis not present

## 2021-07-30 DIAGNOSIS — N958 Other specified menopausal and perimenopausal disorders: Secondary | ICD-10-CM | POA: Diagnosis not present

## 2021-07-30 DIAGNOSIS — N812 Incomplete uterovaginal prolapse: Secondary | ICD-10-CM | POA: Diagnosis not present

## 2021-08-12 DIAGNOSIS — H903 Sensorineural hearing loss, bilateral: Secondary | ICD-10-CM | POA: Diagnosis not present

## 2021-08-14 ENCOUNTER — Encounter (HOSPITAL_COMMUNITY): Payer: Self-pay

## 2021-08-14 ENCOUNTER — Telehealth (HOSPITAL_COMMUNITY): Payer: Self-pay | Admitting: *Deleted

## 2021-08-14 NOTE — Telephone Encounter (Signed)
Shannon Moore returned call and is eager to schedule for cardiac rehab however in looking at her calendar she would not be able to participate consistently due to travel.  Shannon Moore has a week vacation in August and September, she will be gone all of October.  Mutually agreed to hold off on scheduling for November.  Will call back in October for scheduling for November. Cherre Huger, BSN Cardiac and Training and development officer

## 2021-08-17 ENCOUNTER — Other Ambulatory Visit: Payer: Self-pay

## 2021-08-17 ENCOUNTER — Emergency Department (HOSPITAL_BASED_OUTPATIENT_CLINIC_OR_DEPARTMENT_OTHER)
Admission: EM | Admit: 2021-08-17 | Discharge: 2021-08-17 | Disposition: A | Discharge status: Home / Self Care | Payer: Medicare HMO | Attending: Emergency Medicine | Admitting: Emergency Medicine

## 2021-08-17 ENCOUNTER — Emergency Department (HOSPITAL_BASED_OUTPATIENT_CLINIC_OR_DEPARTMENT_OTHER): Payer: Medicare HMO

## 2021-08-17 ENCOUNTER — Encounter (HOSPITAL_BASED_OUTPATIENT_CLINIC_OR_DEPARTMENT_OTHER): Payer: Self-pay

## 2021-08-17 DIAGNOSIS — N132 Hydronephrosis with renal and ureteral calculous obstruction: Secondary | ICD-10-CM | POA: Diagnosis not present

## 2021-08-17 DIAGNOSIS — K5790 Diverticulosis of intestine, part unspecified, without perforation or abscess without bleeding: Secondary | ICD-10-CM | POA: Diagnosis not present

## 2021-08-17 DIAGNOSIS — K5900 Constipation, unspecified: Secondary | ICD-10-CM | POA: Insufficient documentation

## 2021-08-17 DIAGNOSIS — R1032 Left lower quadrant pain: Secondary | ICD-10-CM | POA: Diagnosis present

## 2021-08-17 DIAGNOSIS — N201 Calculus of ureter: Secondary | ICD-10-CM

## 2021-08-17 DIAGNOSIS — K5730 Diverticulosis of large intestine without perforation or abscess without bleeding: Secondary | ICD-10-CM | POA: Diagnosis not present

## 2021-08-17 DIAGNOSIS — I700 Atherosclerosis of aorta: Secondary | ICD-10-CM | POA: Diagnosis not present

## 2021-08-17 DIAGNOSIS — R162 Hepatomegaly with splenomegaly, not elsewhere classified: Secondary | ICD-10-CM | POA: Insufficient documentation

## 2021-08-17 DIAGNOSIS — N281 Cyst of kidney, acquired: Secondary | ICD-10-CM | POA: Diagnosis not present

## 2021-08-17 DIAGNOSIS — K760 Fatty (change of) liver, not elsewhere classified: Secondary | ICD-10-CM | POA: Diagnosis not present

## 2021-08-17 DIAGNOSIS — K439 Ventral hernia without obstruction or gangrene: Secondary | ICD-10-CM | POA: Insufficient documentation

## 2021-08-17 LAB — URINALYSIS, ROUTINE W REFLEX MICROSCOPIC
Bilirubin Urine: NEGATIVE
Glucose, UA: NEGATIVE mg/dL
Hgb urine dipstick: NEGATIVE
Ketones, ur: NEGATIVE mg/dL
Nitrite: NEGATIVE
Protein, ur: 30 mg/dL — AB
Specific Gravity, Urine: 1.026 (ref 1.005–1.030)
pH: 6.5 (ref 5.0–8.0)

## 2021-08-17 LAB — COMPREHENSIVE METABOLIC PANEL
ALT: 16 U/L (ref 0–44)
AST: 14 U/L — ABNORMAL LOW (ref 15–41)
Albumin: 4.6 g/dL (ref 3.5–5.0)
Alkaline Phosphatase: 54 U/L (ref 38–126)
Anion gap: 12 (ref 5–15)
BUN: 24 mg/dL — ABNORMAL HIGH (ref 8–23)
CO2: 28 mmol/L (ref 22–32)
Calcium: 9.8 mg/dL (ref 8.9–10.3)
Chloride: 100 mmol/L (ref 98–111)
Creatinine, Ser: 1.28 mg/dL — ABNORMAL HIGH (ref 0.44–1.00)
GFR, Estimated: 43 mL/min — ABNORMAL LOW (ref >60)
Glucose, Bld: 189 mg/dL — ABNORMAL HIGH (ref 70–99)
Potassium: 3.5 mmol/L (ref 3.5–5.1)
Sodium: 140 mmol/L (ref 135–145)
Total Bilirubin: 0.5 mg/dL (ref 0.3–1.2)
Total Protein: 7.7 g/dL (ref 6.5–8.1)

## 2021-08-17 LAB — CBC
HCT: 37.4 % (ref 36.0–46.0)
Hemoglobin: 12.8 g/dL (ref 12.0–15.0)
MCH: 31.2 pg (ref 26.0–34.0)
MCHC: 34.2 g/dL (ref 30.0–36.0)
MCV: 91.2 fL (ref 80.0–100.0)
Platelets: 272 10*3/uL (ref 150–400)
RBC: 4.1 MIL/uL (ref 3.87–5.11)
RDW: 14.1 % (ref 11.5–15.5)
WBC: 11.5 10*3/uL — ABNORMAL HIGH (ref 4.0–10.5)
nRBC: 0 % (ref 0.0–0.2)

## 2021-08-17 LAB — CBG MONITORING, ED: Glucose-Capillary: 182 mg/dL — ABNORMAL HIGH (ref 70–99)

## 2021-08-17 LAB — LIPASE, BLOOD: Lipase: 29 U/L (ref 11–51)

## 2021-08-17 MED ORDER — IOHEXOL 300 MG/ML  SOLN
100.0000 mL | Freq: Once | INTRAMUSCULAR | Status: AC | PRN
Start: 2021-08-17 — End: 2021-08-17
  Administered 2021-08-17: 85 mL via INTRAVENOUS

## 2021-08-17 MED ORDER — HYDROCODONE-ACETAMINOPHEN 5-325 MG PO TABS: 1.0000 | ORAL_TABLET | Freq: Four times a day (QID) | ORAL | 0 refills | Status: DC | PRN

## 2021-08-17 MED ORDER — LACTATED RINGERS IV BOLUS
1000.0000 mL | Freq: Once | INTRAVENOUS | Status: AC
  Administered 2021-08-17: 1000 mL via INTRAVENOUS

## 2021-08-17 NOTE — Discharge Instructions (Addendum)
Your CT scan shows a kidney stone on the left side.  If you develop fever, vomiting, new or worsening or intractable pain, urinary tract infection symptoms, or any other new/concerning symptoms or return to the ER for evaluation.  Otherwise be sure to drink plenty of fluids and you may take Tylenol for pain and then you can take hydrocodone if this Tylenol does not help.  Follow-up with the urologist.

## 2021-08-17 NOTE — ED Triage Notes (Signed)
Patient here POV from Home.  Endorses Pain to Left Lower ABD that began approximately 36-48 Hours ago. No other Associated Symptoms such as Fevers, N/V/D.   History of Diverticulosis.  NAD Noted during Triage. A&Ox4. GCS 15. Ambulatory.

## 2021-08-17 NOTE — ED Provider Notes (Signed)
[facility] DEPT Provider Note   CSN: [id number] Arrival date & time: 08/17/21  1204     History  Chief Complaint  Patient presents with   Abdominal Pain    Shannon Moore is a 76 y.o. female.  HPI 76 year old female presents with left lower quadrant abdominal pain.  In 2021 she had to have a laparotomy for diverticulitis and complications.  At that time she was very ill and states that currently she is nowhere near that bad.  On 8/4 she developed some transient left lower quadrant pain but seem to go away.  Came back yesterday and has been progressively worsening.  At the moment that I am examining her its not as bad though still there.  No fevers or vomiting but she has been constipated, last having a bowel movement yesterday.  Had a small amount of bright red blood that she thinks is from her hemorrhoids.  Otherwise she wants to make sure she does not have any diverticulitis complications.  Home Medications Prior to Admission medications   Medication Sig Start Date End Date Taking? Authorizing Provider  HYDROcodone-acetaminophen (NORCO) 5-325 MG tablet Take 1 tablet by mouth every 6 (six) hours as needed. 08/17/21  Yes Sherwood Gambler, MD  allopurinol (ZYLOPRIM) 300 MG tablet Take 300 mg by mouth daily with supper.    [provider]  aspirin EC 81 MG tablet 81 mg daily. 06/03/21   [provider]  b complex vitamins capsule Take 1 capsule by mouth daily.    [provider]  Biotin 10000 MCG TABS Take 10,000 mcg by mouth daily. 06/13/21   Strader, Fransisco Hertz, PA-C  cetirizine (ZYRTEC) 10 MG tablet Take 10 mg by mouth daily.    [provider]  Cholecalciferol (VITAMIN D3) 50 MCG (2000 UT) TABS Take 2,000 Units by mouth daily with supper.    [provider]  fluticasone (FLONASE) 50 MCG/ACT nasal spray Place 2 sprays into both nostrils daily as needed for allergies or rhinitis.    [provider]  furosemide  (LASIX) 20 MG tablet Take 1 tablet (20 mg total) by mouth every other day. May increase to daily if needed, but let us know if you do 06/29/21   Richardson Dopp T, PA-C  gabapentin (NEURONTIN) 300 MG capsule Take 300-600 mg by mouth at bedtime. 07/09/19   [provider]  Glucosamine-Chondroit-Vit C-Mn (GLUCOSAMINE-CHONDROITIN) CAPS Take 1 capsule by mouth 2 (two) times daily.    [provider]  magnesium gluconate (MAGONATE) 500 MG tablet Take 500 mg by mouth daily with supper.    [provider]  metFORMIN (GLUCOPHAGE-XR) 500 MG 24 hr tablet Take 500 mg by mouth 2 (two) times daily with a meal.    [provider]  metoprolol succinate (TOPROL-XL) 25 MG 24 hr tablet Take 25 mg by mouth daily with supper. 02/14/17   [provider]  Multiple Vitamins-Minerals (MULTIVITAMIN WITH MINERALS) tablet Take 1 tablet by mouth daily.    [provider]  nitroGLYCERIN (NITROSTAT) 0.4 MG SL tablet Place 1 tablet (0.4 mg total) under the tongue every 5 (five) minutes as needed for chest pain. 06/10/21 09/08/21  Richardson Dopp T, PA-C  olmesartan (BENICAR) 20 MG tablet Take 0.5 tablets (10 mg total) by mouth daily. 06/10/21   Richardson Dopp T, PA-C  pantoprazole (PROTONIX) 40 MG tablet Take 40 mg by mouth as needed (for indigestion). 06/16/21   [provider]  Probiotic Product (PROBIOTIC PO) Take 1 tablet  by mouth daily.    [provider]  rosuvastatin (CRESTOR) 20 MG tablet Take 1 tablet (20 mg total) by mouth daily. 06/29/21   Richardson Dopp T, PA-C  TACROLIMUS EX Apply 1 application. topically daily as needed (Psoriasis). Ointment    [provider]  ticagrelor (BRILINTA) 90 MG TABS tablet Take 1 tablet (90 mg total) by mouth 2 (two) times daily. 07/06/21   Freada Bergeron, MD  tiZANidine (ZANAFLEX) 2 MG tablet Take 2 mg by mouth 3 (three) times daily as needed for muscle spasms. 06/15/21   [provider]      Allergies     Atorvastatin, Diovan [valsartan], Iodinated contrast media, Lotensin [benazepril], Sertraline, Sulfa antibiotics, Sulfasalazine, and Penicillins    Review of Systems   Review of Systems  Constitutional:  Negative for fever.  Gastrointestinal:  Positive for abdominal pain and constipation. Negative for vomiting.    Physical Exam Updated Vital Signs BP (!) 142/72 (BP Location: Right Arm)   Pulse 66   Temp 99.1 F (37.3 C)   Resp 16   Ht '5\' 1"'$  (1.549 m)   Wt 77.1 kg   SpO2 95%   BMI 32.12 kg/m  Physical Exam Vitals and nursing note reviewed.  Constitutional:      General: She is not in acute distress.    Appearance: She is well-developed. She is not ill-appearing or diaphoretic.  HENT:     Head: Normocephalic and atraumatic.  Cardiovascular:     Rate and Rhythm: Normal rate and regular rhythm.     Heart sounds: Normal heart sounds.  Pulmonary:     Effort: Pulmonary effort is normal.     Breath sounds: Normal breath sounds.  Abdominal:     Palpations: Abdomen is soft.     Tenderness: There is abdominal tenderness in the left lower quadrant.  Skin:    General: Skin is warm and dry.  Neurological:     Mental Status: She is alert.     ED Results / Procedures / Treatments   Labs (all labs ordered are listed, but only abnormal results are displayed) Labs Reviewed  COMPREHENSIVE METABOLIC PANEL - Abnormal; Notable for the following components:      Result Value   Glucose, Bld 189 (*)    BUN 24 (*)    Creatinine, Ser 1.28 (*)    AST 14 (*)    GFR, Estimated 43 (*)    All other components within normal limits  CBC - Abnormal; Notable for the following components:   WBC 11.5 (*)    All other components within normal limits  URINALYSIS, ROUTINE W REFLEX MICROSCOPIC - Abnormal; Notable for the following components:   Protein, ur 30 (*)    Leukocytes,Ua TRACE (*)    All other components within normal limits  CBG MONITORING, ED - Abnormal; Notable for the following  components:   Glucose-Capillary 182 (*)    All other components within normal limits  LIPASE, BLOOD    EKG None  Radiology CT ABDOMEN PELVIS W CONTRAST  Result Date: 08/17/2021 CLINICAL DATA:  Diverticulitis, complication suspected EXAM: CT ABDOMEN AND PELVIS WITH CONTRAST TECHNIQUE: Multidetector CT imaging of the abdomen and pelvis was performed using the standard protocol following bolus administration of intravenous contrast. RADIATION DOSE REDUCTION: This exam was performed according to the departmental dose-optimization program which includes automated exposure control, adjustment of the mA and/or kV according to patient size and/or use of iterative reconstruction technique. CONTRAST:  90m OMNIPAQUE IOHEXOL 300  MG/ML  SOLN COMPARISON:  None Available. FINDINGS: Lower chest: No acute abnormality. Hepatobiliary: Mild enlargement of the liver. Attic steatosis. No evidence of a mass. Unremarkable gallbladder. No biliary dilation. Pancreas: Unremarkable. No pancreatic ductal dilatation or surrounding inflammatory changes. Spleen: Mildly enlarged. Adrenals/Urinary Tract: Unremarkable adrenal glands. Approximately 5 mm calculus in the proximal left ureter with associated proximal left hydroureteronephrosis. Similar subcentimeter cyst in the left lower pole. Stomach/Bowel: Stomach is within normal limits. No evidence of bowel wall thickening, distention, or inflammatory changes. Diverticulosis without evidence of diverticulitis. Vascular/Lymphatic: Atherosclerosis.  No enlarged lymph nodes. Reproductive: Uterus and bilateral adnexa are unremarkable. Other: Small ventral abdominal wall hernia which contains loops of bowel. Musculoskeletal: Lower lumbar degenerative change. IMPRESSION: 1. Approximately 5 mm calculus in the proximal left ureter with moderate left hydroureteronephrosis. 2. Diverticulosis without evidence of diverticulitis. 3. Small ventral abdominal wall hernia which contains loops of bowel.  No evidence of bowel obstruction. 4. Hepatic steatosis and mild hepatosplenomegaly. 5.  Aortic Atherosclerosis (ICD10-I70.0). Electronically Signed   By: Margaretha Sheffield M.D.   On: 08/17/2021 14:49    Procedures Procedures    Medications Ordered in ED Medications  lactated ringers bolus 1,000 mL (1,000 mLs Intravenous New Bag/Given 08/17/21 1419)  iohexol (OMNIPAQUE) 300 MG/ML solution 100 mL (85 mLs Intravenous Contrast Given 08/17/21 1433)    ED Course/ Medical Decision Making/ A&P                           Medical Decision Making Amount and/or Complexity of Data Reviewed Labs: ordered.    Details: Mild leukocytosis of 11.5.  Mild elevation of creatinine and BUN from baseline but not consistent with acute kidney injury. Radiology: ordered and independent interpretation performed.    Details: Left hydronephrosis and left ureteral stone.  Diverticulosis without diverticulitis  Risk Prescription drug management.   Patient is well-appearing here.  She has declined pain medicine.  Work-up shows left-sided kidney stone and some hydronephrosis.  Kidney functions a little worse than baseline but not bad and not consistent with AKI.  At this point she seems to have good pain control and seems stable for discharge home with outpatient management of ureteral stone.  I did consider tamsulosin based on the 5 mm stone, but with her sulfa allergy and potential cross-reactivity, I think it would be best to avoid this and have her hydrate and follow-up with urology.  Was advised of return precautions.        Final Clinical Impression(s) / ED Diagnoses Final diagnoses:  Left ureteral stone    Rx / DC Orders ED Discharge Orders          Ordered    HYDROcodone-acetaminophen (NORCO) 5-325 MG tablet  Every 6 hours PRN        08/17/21 1502              Sherwood Gambler, MD 08/17/21 1512

## 2021-10-05 DIAGNOSIS — E785 Hyperlipidemia, unspecified: Secondary | ICD-10-CM | POA: Diagnosis not present

## 2021-10-05 DIAGNOSIS — I519 Heart disease, unspecified: Secondary | ICD-10-CM | POA: Diagnosis not present

## 2021-10-05 DIAGNOSIS — I10 Essential (primary) hypertension: Secondary | ICD-10-CM | POA: Diagnosis not present

## 2021-10-05 DIAGNOSIS — M109 Gout, unspecified: Secondary | ICD-10-CM | POA: Diagnosis not present

## 2021-10-05 DIAGNOSIS — E1169 Type 2 diabetes mellitus with other specified complication: Secondary | ICD-10-CM | POA: Diagnosis not present

## 2021-10-05 DIAGNOSIS — E1142 Type 2 diabetes mellitus with diabetic polyneuropathy: Secondary | ICD-10-CM | POA: Diagnosis not present

## 2021-10-05 DIAGNOSIS — E669 Obesity, unspecified: Secondary | ICD-10-CM | POA: Diagnosis not present

## 2021-10-05 DIAGNOSIS — K219 Gastro-esophageal reflux disease without esophagitis: Secondary | ICD-10-CM | POA: Diagnosis not present

## 2021-10-05 DIAGNOSIS — K589 Irritable bowel syndrome without diarrhea: Secondary | ICD-10-CM | POA: Diagnosis not present

## 2021-11-13 ENCOUNTER — Ambulatory Visit: Payer: Medicare HMO | Admitting: Cardiology

## 2021-11-30 DIAGNOSIS — H903 Sensorineural hearing loss, bilateral: Secondary | ICD-10-CM | POA: Diagnosis not present

## 2021-12-24 NOTE — Progress Notes (Deleted)
Cardiology Office Note:    Date:  01/05/2022   ID:  Shannon Moore, DOB 06-02-1945, MRN [id number]  PCP:  Harlan Stains, MD   [facility] Providers Cardiologist:  Freada Bergeron, MD     Referring MD: Harlan Stains, MD    History of Present Illness:    Shannon Moore is a 76 y.o. female with a hx of CAD, HTN, HLD, DMII, and HFpEF who returns to clinic for follow-up.  Patient was initially referred in 05/2021 for dyspnea on exertion. TTE 05/18/21 showed EF 55-60%, no WMA, G1DD, trivial MR, moderate MAC. Coronary CTA showed severe stenosis in proximal RCA which was positive by FFR. She was referred for cath in 06/12/21 which showed 80% proximal-RCA stenosis. Underwent PCI/DES placement with a Synergy XD 3.0 mm x 20 mm. Was started on DAPT with ASA and Brilitna.    Was last seen in follow-up by Richardson Dopp on 06/2021 where she was stable from a CV standpoint. Had mild LE edema and was started on lasix every other day and HCTZ was discontinued.  Today, ***   Past Medical History:  Diagnosis Date   Abnormal mammogram    Adenomatous polyp of colon    Anginal pain (HCC)    Arthritis    Hands, Knees hips, feet,    Bell's palsy    CAD (coronary artery disease)    a. s/p DES to prox-RCA in 06/2021 following abnormal Coronary CT   CHF (congestive heart failure) ([place]) 1990s   pt feels it is from Zoloft or a virus   Diabetic peripheral neuropathy (HCC)    Diastolic dysfunction    Diverticulitis    DM (diabetes mellitus) ([place])    Dysphagia    Esophageal obstruction    Essential hypertension 01/08/2014   Female cystocele    GERD (gastroesophageal reflux disease)    Goiter    Gout    History of 2019 novel coronavirus disease (COVID-19)    HTN (hypertension)    Hyperlipidemia 01/08/2014   Migraines    Mixed incontinence urge and stress    Obesity (BMI 30-39.9) 01/08/2014   Osteoarthritis of knee    Thyroid nodule    Type 2 diabetes mellitus without complication ([place])  55/73/2202    Past Surgical History:  Procedure Laterality Date   CARDIAC SURGERY     CORONARY STENT INTERVENTION N/A 06/12/2021   Procedure: CORONARY STENT INTERVENTION;  Surgeon: Leonie Man, MD;  Location: [facility];  Service: Cardiovascular;  Laterality: N/A;   DIAGNOSTIC LAPAROSCOPY     EYE SURGERY     bilateral  RK 10 yrs ago   LAPAROTOMY N/A 08/12/2019   Procedure: EXPLORATORY LAPAROTOMY WITH MOBILIZATION OF ASCENDING AND DESCENDING COLON, MOBILIZATION OF DUODENUM, ABDOMINAL [place] OUT AND DRAIN PLACEMENT.;  Surgeon: Jesusita Oka, MD;  Location: [facility];  Service: General;  Laterality: N/A;   left and right knee arthroscopies- left 6 months ago     LEFT HEART CATH AND CORONARY ANGIOGRAPHY N/A 06/12/2021   Procedure: LEFT HEART CATH AND CORONARY ANGIOGRAPHY;  Surgeon: Leonie Man, MD;  Location: [facility];  Service: Cardiovascular;  Laterality: N/A;   LEFT HEART CATHETERIZATION WITH CORONARY ANGIOGRAM N/A 01/10/2014   Procedure: LEFT HEART CATHETERIZATION WITH CORONARY ANGIOGRAM;  Surgeon: Jacolyn Reedy, MD;  Location: [facility];  Service: Cardiovascular;  Laterality: N/A;   removal of bladder tumor     TOTAL KNEE ARTHROPLASTY  11/16/2010   Procedure: TOTAL KNEE  ARTHROPLASTY;  Surgeon: Mauri Pole;  Location: [facility];  Service: Orthopedics;  Laterality: Left;   TOTAL KNEE ARTHROPLASTY Right 08/07/2019   Procedure: TOTAL KNEE ARTHROPLASTY;  Surgeon: Paralee Cancel, MD;  Location: [facility];  Service: Orthopedics;  Laterality: Right;  70 mins   TUBAL LIGATION      Current Medications: Current Meds  Medication Sig   allopurinol (ZYLOPRIM) 300 MG tablet Take 300 mg by mouth daily with supper.   aspirin EC 81 MG tablet 81 mg daily.   b complex vitamins capsule Take 1 capsule by mouth daily.   Biotin 10000 MCG TABS Take 10,000 mcg by mouth daily.   cetirizine (ZYRTEC) 10 MG tablet Take 10 mg by mouth daily.   Cholecalciferol (VITAMIN D3) 50 MCG (2000 UT) TABS  Take 2,000 Units by mouth daily with supper.   estradiol (ESTRACE) 0.1 MG/GM vaginal cream Place 1 Applicatorful vaginally as needed.   fluticasone (FLONASE) 50 MCG/ACT nasal spray Place 2 sprays into both nostrils daily as needed for allergies or rhinitis.   gabapentin (NEURONTIN) 300 MG capsule Take 300-600 mg by mouth at bedtime.   Glucosamine-Chondroitin 500-400 MG CAPS Take 1 capsule by mouth daily at 6 (six) AM.   magnesium gluconate (MAGONATE) 500 MG tablet Take 500 mg by mouth daily with supper.   metFORMIN (GLUCOPHAGE-XR) 500 MG 24 hr tablet Take 500 mg by mouth 2 (two) times daily with a meal.   metoprolol succinate (TOPROL-XL) 25 MG 24 hr tablet Take 25 mg by mouth daily with supper.   Multiple Vitamins-Minerals (MULTIVITAMIN WITH MINERALS) tablet Take 1 tablet by mouth daily.   nitroGLYCERIN (NITROSTAT) 0.4 MG SL tablet Place 1 tablet (0.4 mg total) under the tongue every 5 (five) minutes as needed for chest pain.   olmesartan (BENICAR) 20 MG tablet Take 0.5 tablets (10 mg total) by mouth daily.   pantoprazole (PROTONIX) 40 MG tablet Take 40 mg by mouth as needed (for indigestion).   Probiotic Product (PROBIOTIC PO) Take 1 tablet by mouth daily.   rosuvastatin (CRESTOR) 20 MG tablet Take 1 tablet (20 mg total) by mouth daily.   TACROLIMUS EX Apply 1 application. topically daily as needed (Psoriasis). Ointment   ticagrelor (BRILINTA) 90 MG TABS tablet Take 1 tablet (90 mg total) by mouth 2 (two) times daily.   tiZANidine (ZANAFLEX) 2 MG tablet Take 2 mg by mouth 3 (three) times daily as needed for muscle spasms.   [DISCONTINUED] Glucosamine-Chondroit-Vit C-Mn (GLUCOSAMINE-CHONDROITIN) CAPS Take 1 capsule by mouth 2 (two) times daily.     Allergies:   Atorvastatin, Diovan [valsartan], Iodinated contrast media, Lotensin [benazepril], Sertraline, Sulfa antibiotics, Sulfasalazine, and Penicillins   Social History   Socioeconomic History   Marital status: Married    Spouse name: Not  on file   Number of children: Not on file   Years of education: Not on file   Highest education level: Not on file  Occupational History   Not on file  Tobacco Use   Smoking status: Former    Packs/day: 1.00    Years: 8.00    Total pack years: 8.00    Types: Cigarettes    Quit date: 11/10/1977    Years since quitting: 44.1   Smokeless tobacco: Never  Vaping Use   Vaping Use: Never used  Substance and Sexual Activity   Alcohol use: Yes    Alcohol/week: 1.0 standard drink of alcohol    Types: 1 Glasses of wine per week   Drug use: No  Sexual activity: Not on file  Other Topics Concern   Not on file  Social History Narrative   Not on file   Social Determinants of Health   Financial Resource Strain: Not on file  Food Insecurity: Not on file  Transportation Needs: Not on file  Physical Activity: Not on file  Stress: Not on file  Social Connections: Not on file     Family History: The patient's Family history is unknown by patient.  ROS:   Review of Systems  Constitutional:  Negative for chills and fever.  HENT:  Negative for congestion.   Eyes:  Negative for redness.  Respiratory:  Positive for shortness of breath. Negative for hemoptysis and stridor.   Cardiovascular:  Positive for chest pain and palpitations. Negative for orthopnea, claudication, leg swelling and PND.  Gastrointestinal:  Negative for diarrhea and melena.  Genitourinary:  Negative for flank pain.  Musculoskeletal:  Positive for joint pain. Negative for falls.  Neurological:  Negative for seizures and headaches.  Endo/Heme/Allergies:  Negative for polydipsia.  Psychiatric/Behavioral:  Negative for depression. The patient is not nervous/anxious.      EKGs/Labs/Other Studies Reviewed:    The following studies were reviewed today: LEFT HEART CATH 06/12/2021   Culprit lesion: Prox RCA lesion is 80% stenosed.   A drug-eluting stent was successfully placed using a SYNERGY XD 3.0X20.           CCTA 06/02/21 Coronary calcium score:  287,  75th percentile. Right Coronary Artery:  proximal 70-99%  Left Main Coronary Artery: Ostial 1-10%  Left Anterior Descending Coronary Artery:  proximal LAD with 25-49%   Left Circumflex Artery: proximal LCx 25-49%   FFR 1. Left Main:  No significant stenosis. FFR = 0.99 2. LAD: No significant stenosis. Proximal FFR = 0.98, Mid FFR = 0.95, Distal FFR = 0.89 3. LCX: No significant stenosis. Proximal FFR = 0.94, Distal FFR = 0.93 4. RCA: No significant stenosis. Ostial FFR = 0.99, FFR after proximal stenosis = 0.67 IMPRESSION: 1.  CT FFR analysis shows significant stenosis in the proximal RCA.   Echocardiogram 05/18/21 EF 55-60, no RWMA, GR 1 DD, normal RVSF, trivial MR, moderate MAC, mild AV calcification  EKG:  EKG is personally reviewd. 05/13/2021: Sinus rhythm. Rate 64 bpm.   Recent Labs: 08/17/2021: ALT 16; BUN 24; Creatinine, Ser 1.28; Hemoglobin 12.8; Platelets 272; Potassium 3.5; Sodium 140   Recent Lipid Panel    Component Value Date/Time   CHOL 120 06/29/2021 1213   TRIG 155 (H) 06/29/2021 1213   HDL 49 06/29/2021 1213   CHOLHDL 2.4 06/29/2021 1213   LDLCALC 45 06/29/2021 1213     Risk Assessment/Calculations:           Physical Exam:    VS:  BP (!) 142/72   Pulse 72   Ht _0  (1.549 m)   Wt 173 lb 9.6 oz (78.7 kg)   SpO2 98%   BMI 32.80 kg/m     Wt Readings from Last 3 Encounters:  01/05/22 173 lb 9.6 oz (78.7 kg)  08/17/21 169 lb 15.6 oz (77.1 kg)  06/29/21 170 lb (77.1 kg)     GEN: Well nourished, well developed in no acute distress HEENT: Normal NECK: No JVD; No carotid bruits CARDIAC: RRR, no murmurs, rubs, gallops RESPIRATORY:  Clear to auscultation without rales, wheezing or rhonchi  ABDOMEN: Soft, non-tender, non-distended MUSCULOSKELETAL:  No edema; No deformity  SKIN: Warm and dry NEUROLOGIC:  Alert and oriented x 3 PSYCHIATRIC:  Normal affect   ASSESSMENT:    No diagnosis found.  PLAN:     In order of problems listed above:  #CAD s/p PCI to RCA: Patient with recent PCI to [facility] in 06/2021. Currently doing well without anginal symptoms. -Continue ASA 35m daily -Continue brilinta 935mBID -Continue crestor 2085maily -Continue metop 26m96m daily  #Chronic Diastolic HF: TTE 05/216/1096h LVEF 55-60% with G1DD. Currently euvolemic and compensated. -Check TTE -Continue metoprolol 26mg26mly -Continue olmesartan 10mg 39my -Continue lasix 20mg a70meded -Low Na diet  #Palpitations: Occasional palpitations that can occur sporadically. Has some associated Sob but no lightheadedness, dizziness or syncope. Given infrequent nature, discussed option of monitoring with either the apple watch or Kardia device. If becomes more frequent, can obtain cardiac monitor at that time. -Continue metop 26mg XL85mly -Discussed option of Kardia or apple watch -If becomes more frequent, can obtain cardiac monitor at that time  #HTN: Well controlled and at goal at home <120/80s. -Continue olmesartan 40mg dai60mContinue metoprolol 26mg dail68mHLD: -Continue crestor 20mg daily84m fish oil for TG -Repeat lipids in 02/2022 for monitoring to TG  #DMII: -Continue metformin  {The patient has an active order for outpatient cardiac rehabilitation.   Please indicate if the patient is ready to start. Do NOT delete this.  It will auto delete.  Refresh note, then sign.              Click here to document readiness and see contraindications.  :1}  Cardiac Rehabilitation Eligibility Assessment  The patient is ready to start cardiac rehabilitation from a cardiac standpoint.     Follow-up:  6 months.  Medication Adjustments/Labs and Tests Ordered: Current medicines are reviewed at length with the patient today.  Concerns regarding medicines are outlined above.   No orders of the defined types were placed in this encounter.  No orders of the defined types were placed in this  encounter.  There are no Patient Instructions on file for this visit.    I,Mathew Stumpf,acting as a scribe for Education administratorr E PFreada Bergerondocumented all relevant documentation on the behalf of Adyson Vanburen E PFreada Bergeronected by  Rakin Lemelle E PFreada Bergeronin the presence of Burnis Kaser E PFreada Bergeroneather E PFreada Bergeronreviewed all documentation for this visit. The documentation on 01/05/22 for the exam, diagnosis, procedures, and orders are all accurate and complete.   Signed, Christen Wardrop E PFreada Bergeron/2023 9:41 AM    [facility]

## 2022-01-05 ENCOUNTER — Encounter: Payer: Self-pay | Admitting: Cardiology

## 2022-01-05 ENCOUNTER — Ambulatory Visit: Payer: Medicare HMO | Attending: Cardiology | Admitting: Cardiology

## 2022-01-05 VITALS — BP 142/72 | HR 72 | Ht 61.0 in | Wt 173.6 lb

## 2022-01-05 DIAGNOSIS — R002 Palpitations: Secondary | ICD-10-CM

## 2022-01-05 DIAGNOSIS — I25119 Atherosclerotic heart disease of native coronary artery with unspecified angina pectoris: Secondary | ICD-10-CM | POA: Diagnosis not present

## 2022-01-05 DIAGNOSIS — E119 Type 2 diabetes mellitus without complications: Secondary | ICD-10-CM | POA: Diagnosis not present

## 2022-01-05 DIAGNOSIS — I2510 Atherosclerotic heart disease of native coronary artery without angina pectoris: Secondary | ICD-10-CM

## 2022-01-05 DIAGNOSIS — Z79899 Other long term (current) drug therapy: Secondary | ICD-10-CM | POA: Diagnosis not present

## 2022-01-05 DIAGNOSIS — E785 Hyperlipidemia, unspecified: Secondary | ICD-10-CM | POA: Diagnosis not present

## 2022-01-05 DIAGNOSIS — I10 Essential (primary) hypertension: Secondary | ICD-10-CM | POA: Diagnosis not present

## 2022-01-05 DIAGNOSIS — R072 Precordial pain: Secondary | ICD-10-CM

## 2022-01-05 DIAGNOSIS — I5032 Chronic diastolic (congestive) heart failure: Secondary | ICD-10-CM | POA: Diagnosis not present

## 2022-01-05 NOTE — Patient Instructions (Signed)
Medication Instructions:  Your physician recommends that you continue on your current medications as directed. Please refer to the Current Medication list given to you today.  *If you need a refill on your cardiac medications before your next appointment, please call your pharmacy*   Lab Work: CBC, CMET, Lipids, A1C in February  If you have labs (blood work) drawn today and your tests are completely normal, you will receive your results only by: [place] (if you have MyChart) OR A paper copy in the mail If you have any lab test that is abnormal or we need to change your treatment, we will call you to review the results.   Testing/Procedures: NONE   Follow-Up: At [facility], you and your health needs are our priority.  As part of our continuing mission to provide you with exceptional heart care, we have created designated Provider Care Teams.  These Care Teams include your primary Cardiologist (physician) and Advanced Practice Providers (APPs -  Physician Assistants and Nurse Practitioners) who all work together to provide you with the care you need, when you need it.  We recommend signing up for the patient portal called "MyChart".  Sign up information is provided on this After Visit Summary.  MyChart is used to connect with patients for Virtual Visits (Telemedicine).  Patients are able to view lab/test results, encounter notes, upcoming appointments, etc.  Non-urgent messages can be sent to your provider as well.   To learn more about what you can do with MyChart, go to [email or web address].    Your next appointment:   6 month(s)  The format for your next appointment:   In Person  Provider:   Freada Bergeron, MD       Important Information About Sugar

## 2022-01-05 NOTE — Progress Notes (Signed)
Cardiology Office Note:    Date:  01/05/2022   ID:  MARCHE HOTTENSTEIN, DOB March 11, 1945, MRN [id number]  PCP:  Harlan Stains, MD   [facility] Providers Cardiologist:  Freada Bergeron, MD     Referring MD: Harlan Stains, MD    History of Present Illness:    Shannon Moore is a 76 y.o. female with a hx of CAD, HTN, HLD, DMII, and HFpEF who returns to clinic for follow-up.  Patient was initially referred in 05/2021 for dyspnea on exertion. TTE 05/18/21 showed EF 55-60%, no WMA, G1DD, trivial MR, moderate MAC. Coronary CTA showed severe stenosis in proximal RCA which was positive by FFR. She was referred for cath in 06/12/21 which showed 80% proximal-RCA stenosis. Underwent PCI/DES placement with a Synergy XD 3.0 mm x 20 mm. Was started on DAPT with ASA and Brilitna.    Was last seen in follow-up by Richardson Dopp on 06/2021 where she was stable from a CV standpoint. Had mild LE edema and was started on lasix every other day and HCTZ was discontinued.  Today, the patient states she is doing well. She states that she has occasional chest pain pain that is sharp in nature and not exertional. She believes this is related to nerve compression in her cervical spine. No exertional chest pain or SOB.  Her blood pressure in clinic today is 142/72. Lately she has not been monitoring this, but she confirms more controlled blood pressures averaging 120s-130s at home. She does endorse an element of white coat hypertension.  Otherwise, tolerating medications as prescribed. She denies lightheadedness, headaches, syncope, orthopnea, or PND. Has started taking fish oil for elevated TG.   Past Medical History:  Diagnosis Date   Abnormal mammogram    Adenomatous polyp of colon    Anginal pain ([place])    Arthritis    Hands, Knees hips, feet,    Bell's palsy    CAD (coronary artery disease)    a. s/p DES to prox-RCA in 06/2021 following abnormal Coronary CT   CHF (congestive heart failure) ([place]) 1990s    pt feels it is from Zoloft or a virus   Diabetic peripheral neuropathy (HCC)    Diastolic dysfunction    Diverticulitis    DM (diabetes mellitus) ([place])    Dysphagia    Esophageal obstruction    Essential hypertension 01/08/2014   Female cystocele    GERD (gastroesophageal reflux disease)    Goiter    Gout    History of 2019 novel coronavirus disease (COVID-19)    HTN (hypertension)    Hyperlipidemia 01/08/2014   Migraines    Mixed incontinence urge and stress    Obesity (BMI 30-39.9) 01/08/2014   Osteoarthritis of knee    Thyroid nodule    Type 2 diabetes mellitus without complication ([place]) 65/78/4696    Past Surgical History:  Procedure Laterality Date   CARDIAC SURGERY     CORONARY STENT INTERVENTION N/A 06/12/2021   Procedure: CORONARY STENT INTERVENTION;  Surgeon: Leonie Man, MD;  Location: [place] [facility];  Service: Cardiovascular;  Laterality: N/A;   DIAGNOSTIC LAPAROSCOPY     EYE SURGERY     bilateral  RK 10 yrs ago   LAPAROTOMY N/A 08/12/2019   Procedure: EXPLORATORY LAPAROTOMY WITH MOBILIZATION OF ASCENDING AND DESCENDING COLON, MOBILIZATION OF DUODENUM, ABDOMINAL [place] OUT AND DRAIN PLACEMENT.;  Surgeon: Jesusita Oka, MD;  Location: [place];  Service: General;  Laterality: N/A;   left and right knee arthroscopies-  left 6 months ago     LEFT HEART CATH AND CORONARY ANGIOGRAPHY N/A 06/12/2021   Procedure: LEFT HEART CATH AND CORONARY ANGIOGRAPHY;  Surgeon: Leonie Man, MD;  Location: [place] [facility];  Service: Cardiovascular;  Laterality: N/A;   LEFT HEART CATHETERIZATION WITH CORONARY ANGIOGRAM N/A 01/10/2014   Procedure: LEFT HEART CATHETERIZATION WITH CORONARY ANGIOGRAM;  Surgeon: Jacolyn Reedy, MD;  Location: [facility] C[facility] LAB;  Service: Cardiovascular;  Laterality: N/A;   removal of bladder tumor     TOTAL KNEE ARTHROPLASTY  11/16/2010   Procedure: TOTAL KNEE ARTHROPLASTY;  Surgeon: Mauri Pole;  Location: [facility];  Service: Orthopedics;  Laterality:  Left;   TOTAL KNEE ARTHROPLASTY Right 08/07/2019   Procedure: TOTAL KNEE ARTHROPLASTY;  Surgeon: Paralee Cancel, MD;  Location: [facility];  Service: Orthopedics;  Laterality: Right;  70 mins   TUBAL LIGATION      Current Medications: Current Meds  Medication Sig   allopurinol (ZYLOPRIM) 300 MG tablet Take 300 mg by mouth daily with supper.   aspirin EC 81 MG tablet 81 mg daily.   b complex vitamins capsule Take 1 capsule by mouth daily.   Biotin 10000 MCG TABS Take 10,000 mcg by mouth daily.   cetirizine (ZYRTEC) 10 MG tablet Take 10 mg by mouth daily.   Cholecalciferol (VITAMIN D3) 50 MCG (2000 UT) TABS Take 2,000 Units by mouth daily with supper.   estradiol (ESTRACE) 0.1 MG/GM vaginal cream Place 1 Applicatorful vaginally as needed.   fluticasone (FLONASE) 50 MCG/ACT nasal spray Place 2 sprays into both nostrils daily as needed for allergies or rhinitis.   gabapentin (NEURONTIN) 300 MG capsule Take 300-600 mg by mouth at bedtime.   Glucosamine-Chondroitin 500-400 MG CAPS Take 1 capsule by mouth daily at 6 (six) AM.   magnesium gluconate (MAGONATE) 500 MG tablet Take 500 mg by mouth daily with supper.   metFORMIN (GLUCOPHAGE-XR) 500 MG 24 hr tablet Take 500 mg by mouth 2 (two) times daily with a meal.   metoprolol succinate (TOPROL-XL) 25 MG 24 hr tablet Take 25 mg by mouth daily with supper.   Multiple Vitamins-Minerals (MULTIVITAMIN WITH MINERALS) tablet Take 1 tablet by mouth daily.   nitroGLYCERIN (NITROSTAT) 0.4 MG SL tablet Place 1 tablet (0.4 mg total) under the tongue every 5 (five) minutes as needed for chest pain.   olmesartan (BENICAR) 20 MG tablet Take 0.5 tablets (10 mg total) by mouth daily.   pantoprazole (PROTONIX) 40 MG tablet Take 40 mg by mouth as needed (for indigestion).   Probiotic Product (PROBIOTIC PO) Take 1 tablet by mouth daily.   rosuvastatin (CRESTOR) 20 MG tablet Take 1 tablet (20 mg total) by mouth daily.   TACROLIMUS EX Apply 1 application. topically daily as  needed (Psoriasis). Ointment   ticagrelor (BRILINTA) 90 MG TABS tablet Take 1 tablet (90 mg total) by mouth 2 (two) times daily.   tiZANidine (ZANAFLEX) 2 MG tablet Take 2 mg by mouth 3 (three) times daily as needed for muscle spasms.   [DISCONTINUED] Glucosamine-Chondroit-Vit C-Mn (GLUCOSAMINE-CHONDROITIN) CAPS Take 1 capsule by mouth 2 (two) times daily.     Allergies:   Atorvastatin, Diovan [valsartan], Iodinated contrast media, Lotensin [benazepril], Sertraline, Sulfa antibiotics, Sulfasalazine, and Penicillins   Social History   Socioeconomic History   Marital status: Married    Spouse name: Not on file   Number of children: Not on file   Years of education: Not on file   Highest education level: Not on file  Occupational History   Not on file  Tobacco Use   Smoking status: Former    Packs/day: 1.00    Years: 8.00    Total pack years: 8.00    Types: Cigarettes    Quit date: 11/10/1977    Years since quitting: 44.1   Smokeless tobacco: Never  Vaping Use   Vaping Use: Never used  Substance and Sexual Activity   Alcohol use: Yes    Alcohol/week: 1.0 standard drink of alcohol    Types: 1 Glasses of wine per week   Drug use: No   Sexual activity: Not on file  Other Topics Concern   Not on file  Social History Narrative   Not on file   Social Determinants of Health   Financial Resource Strain: Not on file  Food Insecurity: Not on file  Transportation Needs: Not on file  Physical Activity: Not on file  Stress: Not on file  Social Connections: Not on file     Family History: The patient's Family history is unknown by patient.  ROS:   Review of Systems  Constitutional:  Negative for chills and fever.  HENT:  Negative for congestion.   Eyes:  Negative for redness.  Respiratory:  Positive for shortness of breath. Negative for hemoptysis and stridor.   Cardiovascular:  Positive for chest pain. Negative for palpitations, orthopnea, claudication, leg swelling and  PND.  Gastrointestinal:  Negative for diarrhea and melena.  Genitourinary:  Negative for flank pain.  Musculoskeletal:  Positive for joint pain. Negative for falls.  Neurological:  Negative for seizures and headaches.  Endo/Heme/Allergies:  Negative for polydipsia.  Psychiatric/Behavioral:  Negative for depression. The patient is not nervous/anxious.      EKGs/Labs/Other Studies Reviewed:    The following studies were reviewed today:  LEFT HEART CATH 06/12/2021   Culprit lesion: Prox RCA lesion is 80% stenosed.   A drug-eluting stent was successfully placed using a SYNERGY XD 3.0X20.          CCTA 06/02/21 Coronary calcium score:  287,  75th percentile. Right Coronary Artery:  proximal 70-99%  Left Main Coronary Artery: Ostial 1-10%  Left Anterior Descending Coronary Artery:  proximal LAD with 25-49%   Left Circumflex Artery: proximal LCx 25-49%   FFR 1. Left Main:  No significant stenosis. FFR = 0.99 2. LAD: No significant stenosis. Proximal FFR = 0.98, Mid FFR = 0.95, Distal FFR = 0.89 3. LCX: No significant stenosis. Proximal FFR = 0.94, Distal FFR = 0.93 4. RCA: No significant stenosis. Ostial FFR = 0.99, FFR after proximal stenosis = 0.67 IMPRESSION: 1.  CT FFR analysis shows significant stenosis in the proximal RCA.   Echocardiogram 05/18/21 EF 55-60, no RWMA, GR 1 DD, normal RVSF, trivial MR, moderate MAC, mild AV calcification  EKG:  EKG is personally reviewd. 01/05/2022:  EKG was not ordered. 06/29/2021 (Richardson Dopp, PA-C): NSR, HR 70, left axis deviation, nonspecific ST-T wave changes, QTc 455, similar to prior tracing  05/13/2021: Sinus rhythm. Rate 64 bpm.   Recent Labs: 08/17/2021: ALT 16; BUN 24; Creatinine, Ser 1.28; Hemoglobin 12.8; Platelets 272; Potassium 3.5; Sodium 140   Recent Lipid Panel    Component Value Date/Time   CHOL 120 06/29/2021 1213   TRIG 155 (H) 06/29/2021 1213   HDL 49 06/29/2021 1213   CHOLHDL 2.4 06/29/2021 1213   LDLCALC 45  06/29/2021 1213     Risk Assessment/Calculations:           Physical Exam:  VS:  BP (!) 142/72   Pulse 72   Ht _0  (1.549 m)   Wt 173 lb 9.6 oz (78.7 kg)   SpO2 98%   BMI 32.80 kg/m     Wt Readings from Last 3 Encounters:  01/05/22 173 lb 9.6 oz (78.7 kg)  08/17/21 169 lb 15.6 oz (77.1 kg)  06/29/21 170 lb (77.1 kg)     GEN: Well nourished, well developed in no acute distress HEENT: Normal NECK: No JVD; No carotid bruits CARDIAC: RRR, no murmurs, rubs, gallops RESPIRATORY:  Clear to auscultation without rales, wheezing or rhonchi  ABDOMEN: Soft, non-tender, non-distended MUSCULOSKELETAL:  No edema; No deformity  SKIN: Warm and dry NEUROLOGIC:  Alert and oriented x 3 PSYCHIATRIC:  Normal affect   ASSESSMENT:    1. Coronary artery disease involving native coronary artery of native heart without angina pectoris   2. Hyperlipidemia LDL goal <70   3. Essential hypertension   4. Medication management   5. Diabetes mellitus with coincident hypertension ([place])   6. Chronic heart failure with preserved ejection fraction (HCC)   7. Precordial pain   8. Coronary artery disease involving native coronary artery of native heart with angina pectoris (HCC)   9. Palpitations     PLAN:    In order of problems listed above:  #CAD s/p PCI to RCA: Patient with recent PCI to [facility] in 06/2021. Currently doing well without anginal symptoms. -Continue ASA 42m daily -Continue brilinta 966mBID -Continue crestor 2054maily -Continue metop 47m23m daily -Likely plan to transition to plavix monotherapy after 1 year of DAPT  #Chronic Diastolic HF: TTE 05/273/2202h LVEF 55-60% with G1DD. Currently euvolemic and compensated. -Continue metoprolol 47mg29mly -Continue olmesartan 10mg 55my -Continue lasix 20mg a53meded -Low Na diet  #Palpitations: Improved with very rare episodes. -Continue metop 47mg XL63mly -If becomes more frequent, can obtain cardiac monitor at that  time  #HTN: Well controlled and at goal at home <130/90s. States she has white coat HTN and always elevated in the MD office. She will continue to monitor at home as well.  -Continue olmesartan 10mg dai7mContinue metoprolol 47mg dail11mHLD: -Continue crestor 20mg daily45meck lipids in 02/2022 for monitoring of TG -Continue fish oil for now; can transition to vascepa if still elevated on labs in 02/2022  #DMII: -Continue metformin    Cardiac Rehabilitation Eligibility Assessment  The patient is ready to start cardiac rehabilitation from a cardiac standpoint.     Follow-up:  6 months.  Medication Adjustments/Labs and Tests Ordered: Current medicines are reviewed at length with the patient today.  Concerns regarding medicines are outlined above.   Orders Placed This Encounter  Procedures   CBC   Comprehensive metabolic panel   Hemoglobin A1c   Lipid panel   No orders of the defined types were placed in this encounter.  Patient Instructions  Medication Instructions:  Your physician recommends that you continue on your current medications as directed. Please refer to the Current Medication list given to you today.  *If you need a refill on your cardiac medications before your next appointment, please call your pharmacy*   Lab Work: CBC, CMET, Lipids, A1C in February  If you have labs (blood work) drawn today and your tests are completely normal, you will receive your results only by: MyChart [facility] MyChart) OR A paper copy in the mail If you have any lab test that is abnormal or we need to  change your treatment, we will call you to review the results.   Testing/Procedures: NONE   Follow-Up: At [facility], you and your health needs are our priority.  As part of our continuing mission to provide you with exceptional heart care, we have created designated Provider Care Teams.  These Care Teams include your primary Cardiologist (physician)  and Advanced Practice Providers (APPs -  Physician Assistants and Nurse Practitioners) who all work together to provide you with the care you need, when you need it.  We recommend signing up for the patient portal called "MyChart".  Sign up information is provided on this After Visit Summary.  MyChart is used to connect with patients for Virtual Visits (Telemedicine).  Patients are able to view lab/test results, encounter notes, upcoming appointments, etc.  Non-urgent messages can be sent to your provider as well.   To learn more about what you can do with MyChart, go to [email or web address].    Your next appointment:   6 month(s)  The format for your next appointment:   In Person  Provider:   Freada Bergeron, MD       Important Information About Sugar         I,Mathew Stumpf,acting as a scribe for Freada Bergeron, MD.,have documented all relevant documentation on the behalf of Freada Bergeron, MD,as directed by  Freada Bergeron, MD while in the presence of Freada Bergeron, MD.  I, Freada Bergeron, MD, have reviewed all documentation for this visit. The documentation on 01/05/22 for the exam, diagnosis, procedures, and orders are all accurate and complete.   Signed, Freada Bergeron, MD  01/05/2022 9:54 AM    [place]

## 2022-01-08 ENCOUNTER — Telehealth (HOSPITAL_COMMUNITY): Payer: Self-pay

## 2022-01-08 NOTE — Telephone Encounter (Signed)
Called patient to see if she was interested in participating in the Cardiac Rehab Program. Patient stated yes. Patient will come in for orientation on 01/12/22'@10'$ :30am and will attend the 10:15am exercise class.

## 2022-01-12 ENCOUNTER — Telehealth: Payer: Self-pay | Admitting: *Deleted

## 2022-01-12 ENCOUNTER — Ambulatory Visit (HOSPITAL_COMMUNITY): Payer: Medicare HMO

## 2022-01-12 NOTE — Telephone Encounter (Signed)
-----   Message from [place] K sent at 01/05/2022 10:01 AM EST ----- Regarding: Cardiac Rehab Good morning Carlette! I saw this patient in clinic today with Dr Johney Frame and she asked that I reach out to see if we can get her scheduled for cardiac rehab. Pt states she hasn't been contacted since her hospital stay. It looks like there's been a referral placed by Dr Ellyn Hack in August, but if this is not what you need, please let me know and Johney Frame will be glad to order. I explained to the patient that it could just be a backlog of patients and she was okay with that. Thank you for your help! Judson Roch, RN

## 2022-01-12 NOTE — Telephone Encounter (Signed)
January 08, 2022 Crisoforo [place]      01/08/22  2:50 PM Note Called patient to see if she was interested in participating in the Cardiac Rehab Program. Patient stated yes. Patient will come in for orientation on 01/12/22'@10'$ :30am and will attend the 10:15am exercise class.

## 2022-01-18 ENCOUNTER — Ambulatory Visit (HOSPITAL_COMMUNITY): Payer: Medicare HMO

## 2022-01-19 ENCOUNTER — Telehealth (HOSPITAL_COMMUNITY): Payer: Self-pay

## 2022-01-20 ENCOUNTER — Ambulatory Visit (HOSPITAL_COMMUNITY): Payer: Medicare HMO

## 2022-01-21 ENCOUNTER — Ambulatory Visit (HOSPITAL_COMMUNITY): Payer: Medicare HMO

## 2022-01-22 ENCOUNTER — Ambulatory Visit (HOSPITAL_COMMUNITY): Payer: Medicare HMO

## 2022-01-22 ENCOUNTER — Encounter (HOSPITAL_COMMUNITY)
Admission: RE | Admit: 2022-01-22 | Discharge: 2022-01-22 | Disposition: A | Discharge status: Home / Self Care | Payer: Medicare Other | Source: Ambulatory Visit | Attending: Cardiology | Admitting: Cardiology

## 2022-01-22 VITALS — BP 138/66 | HR 63 | Ht 62.0 in | Wt 168.4 lb

## 2022-01-22 DIAGNOSIS — E669 Obesity, unspecified: Secondary | ICD-10-CM | POA: Diagnosis not present

## 2022-01-22 DIAGNOSIS — Z48812 Encounter for surgical aftercare following surgery on the circulatory system: Secondary | ICD-10-CM | POA: Insufficient documentation

## 2022-01-22 DIAGNOSIS — I10 Essential (primary) hypertension: Secondary | ICD-10-CM | POA: Diagnosis not present

## 2022-01-22 DIAGNOSIS — E119 Type 2 diabetes mellitus without complications: Secondary | ICD-10-CM | POA: Insufficient documentation

## 2022-01-22 DIAGNOSIS — E785 Hyperlipidemia, unspecified: Secondary | ICD-10-CM | POA: Insufficient documentation

## 2022-01-22 DIAGNOSIS — Z955 Presence of coronary angioplasty implant and graft: Secondary | ICD-10-CM | POA: Diagnosis present

## 2022-01-22 LAB — GLUCOSE, CAPILLARY: Glucose-Capillary: 246 mg/dL — ABNORMAL HIGH (ref 70–99)

## 2022-01-22 NOTE — Progress Notes (Signed)
Cardiac Individual Treatment Plan  Patient Details  Name: Shannon Moore MRN: [id number] Date of Birth: 09/06/1945 Referring Provider:   [place] from 01/22/2022 in [facility], Vascular, & [place]  Referring Provider Gwyndolyn Kaufman, MD       Initial Encounter Date:  [place] from 01/22/2022 in [facility]  Date 01/22/22       Visit Diagnosis: Status post coronary artery stent placement  Patient's Home Medications on Admission:  Current Outpatient Medications:    allopurinol (ZYLOPRIM) 300 MG tablet, Take 300 mg by mouth daily with supper., Disp: , Rfl:    aspirin EC 81 MG tablet, 81 mg daily., Disp: , Rfl:    b complex vitamins capsule, Take 1 capsule by mouth daily., Disp: , Rfl:    Biotin 10000 MCG TABS, Take 10,000 mcg by mouth daily., Disp: 30 tablet, Rfl:    cetirizine (ZYRTEC) 10 MG tablet, Take 10 mg by mouth daily., Disp: , Rfl:    Cholecalciferol (VITAMIN D3) 50 MCG (2000 UT) TABS, Take 2,000 Units by mouth daily with supper., Disp: , Rfl:    estradiol (ESTRACE) 0.1 MG/GM vaginal cream, Place 1 Applicatorful vaginally as needed., Disp: , Rfl:    fluticasone (FLONASE) 50 MCG/ACT nasal spray, Place 2 sprays into both nostrils daily as needed for allergies or rhinitis., Disp: , Rfl:    gabapentin (NEURONTIN) 300 MG capsule, Take 300-600 mg by mouth at bedtime., Disp: , Rfl:    Glucosamine-Chondroitin 500-400 MG CAPS, Take 1 capsule by mouth in the morning and at bedtime., Disp: , Rfl:    magnesium gluconate (MAGONATE) 500 MG tablet, Take 500 mg by mouth daily with supper., Disp: , Rfl:    metFORMIN (GLUCOPHAGE-XR) 500 MG 24 hr tablet, Take 500 mg by mouth 2 (two) times daily with a meal., Disp: , Rfl:    metoprolol succinate (TOPROL-XL) 25 MG 24 hr tablet, Take 25 mg by mouth daily with supper., Disp: , Rfl:     Multiple Vitamins-Minerals (MULTIVITAMIN WITH MINERALS) tablet, Take 1 tablet by mouth daily., Disp: , Rfl:    nitroGLYCERIN (NITROSTAT) 0.4 MG SL tablet, Place 1 tablet (0.4 mg total) under the tongue every 5 (five) minutes as needed for chest pain., Disp: 25 tablet, Rfl: 3   olmesartan (BENICAR) 20 MG tablet, Take 0.5 tablets (10 mg total) by mouth daily., Disp: 45 tablet, Rfl: 3   pantoprazole (PROTONIX) 40 MG tablet, Take 40 mg by mouth as needed (for indigestion)., Disp: , Rfl:    Probiotic Product (PROBIOTIC PO), Take 1 tablet by mouth daily., Disp: , Rfl:    rosuvastatin (CRESTOR) 20 MG tablet, Take 1 tablet (20 mg total) by mouth daily., Disp: 90 tablet, Rfl: 3   TACROLIMUS EX, Apply 1 application. topically daily as needed (Psoriasis). Ointment, Disp: , Rfl:    ticagrelor (BRILINTA) 90 MG TABS tablet, Take 1 tablet (90 mg total) by mouth 2 (two) times daily., Disp: 180 tablet, Rfl: 3   tiZANidine (ZANAFLEX) 2 MG tablet, Take 2 mg by mouth 3 (three) times daily as needed for muscle spasms., Disp: , Rfl:   Past Medical History: Past Medical History:  Diagnosis Date   Abnormal mammogram    Adenomatous polyp of colon    Anginal pain (HCC)    Arthritis    Hands, Knees hips, feet,    Bell's palsy  CAD (coronary artery disease)    a. s/p DES to prox-RCA in 06/2021 following abnormal Coronary CT   CHF (congestive heart failure) ([place]) 1990s   pt feels it is from Zoloft or a virus   Diabetic peripheral neuropathy ([place])    Diastolic dysfunction    Diverticulitis    DM (diabetes mellitus) ([place])    Dysphagia    Esophageal obstruction    Essential hypertension 01/08/2014   Female cystocele    GERD (gastroesophageal reflux disease)    Goiter    Gout    History of 2019 novel coronavirus disease (COVID-19)    HTN (hypertension)    Hyperlipidemia 01/08/2014   Migraines    Mixed incontinence urge and stress    Obesity (BMI 30-39.9) 01/08/2014   Osteoarthritis of knee    Thyroid  nodule    Type 2 diabetes mellitus without complication ([place]) 24/09/7351    Tobacco Use: Social History   Tobacco Use  Smoking Status Former   Packs/day: 1.00   Years: 8.00   Total pack years: 8.00   Types: Cigarettes   Quit date: 11/10/1977   Years since quitting: 44.2  Smokeless Tobacco Never    Labs: Review Flowsheet       Latest Ref Rng & Units 07/31/2019 06/12/2021 06/29/2021  Labs for ITP Cardiac and Pulmonary Rehab  Cholestrol 100 - 199 mg/dL - - 120   LDL (calc) 0 - 99 mg/dL - - 45   HDL-C >39 mg/dL - - 49   Trlycerides 0 - 149 mg/dL - - 155   Hemoglobin A1c 4.8 - 5.6 % 6.9  7.0  -    Capillary Blood Glucose: Lab Results  Component Value Date   GLUCAP 246 (H) 01/22/2022   GLUCAP 182 (H) 08/17/2021   GLUCAP 166 (H) 06/13/2021   GLUCAP 142 (H) 06/12/2021   GLUCAP 121 (H) 06/12/2021     Exercise Target Goals: Exercise Program Goal: Individual exercise prescription set using results from initial 6 min walk test and THRR while considering  patient's activity barriers and safety.   Exercise Prescription Goal: Initial exercise prescription builds to 30-45 minutes a day of aerobic activity, 2-3 days per week.  Home exercise guidelines will be given to patient during program as part of exercise prescription that the participant will acknowledge.  Activity Barriers & Risk Stratification:  Activity Barriers & Cardiac Risk Stratification - 01/22/22 1223       Activity Barriers & Cardiac Risk Stratification   Activity Barriers Arthritis;Right Hip Replacement;Left Knee Replacement;Neck/Spine Problems;Joint Problems;Deconditioning;Balance Concerns;History of Falls    Cardiac Risk Stratification High   <5 METS 6MWT            6 Minute Walk:  6 Minute Walk     Row Name 01/22/22 1222         6 Minute Walk   Phase Initial     Distance 1080 feet     Walk Time 6 minutes     # of Rest Breaks 0     MPH 2.05     METS 2.28     RPE 9     Perceived Dyspnea  1   towards end of test     VO2 Peak 7.98     Symptoms Yes (comment)     Comments SOB at end of test     Resting HR 63 bpm     Resting BP 138/66     Resting Oxygen Saturation  97 %  Exercise Oxygen Saturation  during 6 min walk 97 %     Max Ex. HR 116 bpm     Max Ex. BP 154/60     2 Minute Post BP 140/70              Oxygen Initial Assessment:   Oxygen Re-Evaluation:   Oxygen Discharge (Final Oxygen Re-Evaluation):   Initial Exercise Prescription:  Initial Exercise Prescription - 01/22/22 1200       Date of Initial Exercise RX and Referring Provider   Date 01/22/22    Referring Provider Gwyndolyn Kaufman, MD    Expected Discharge Date 03/19/22      Arm Ergometer   Level 1    RPM 50    Minutes 15    METs 2      T5 Nustep   Level 1    SPM 75    Minutes 15    METs 2      Prescription Details   Frequency (times per week) 3    Duration Progress to 30 minutes of continuous aerobic without signs/symptoms of physical distress      Intensity   THRR 40-80% of Max Heartrate 58-115    Ratings of Perceived Exertion 11-13    Perceived Dyspnea 0-4      Progression   Progression Continue to progress workloads to maintain intensity without signs/symptoms of physical distress.      Resistance Training   Training Prescription Yes    Weight 2    Reps 10-15             Perform Capillary Blood Glucose checks as needed.  Exercise Prescription Changes:   Exercise Comments:   Exercise Goals and Review:   Exercise Goals     Row Name 01/22/22 1225             Exercise Goals   Increase Physical Activity Yes       Intervention Provide advice, education, support and counseling about physical activity/exercise needs.;Develop an individualized exercise prescription for aerobic and resistive training based on initial evaluation findings, risk stratification, comorbidities and participant's personal goals.       Expected Outcomes Short Term: Attend rehab on a  regular basis to increase amount of physical activity.;Long Term: Exercising regularly at least 3-5 days a week.;Long Term: Add in home exercise to make exercise part of routine and to increase amount of physical activity.       Increase Strength and Stamina Yes       Intervention Provide advice, education, support and counseling about physical activity/exercise needs.;Develop an individualized exercise prescription for aerobic and resistive training based on initial evaluation findings, risk stratification, comorbidities and participant's personal goals.       Expected Outcomes Short Term: Increase workloads from initial exercise prescription for resistance, speed, and METs.;Short Term: Perform resistance training exercises routinely during rehab and add in resistance training at home;Long Term: Improve cardiorespiratory fitness, muscular endurance and strength as measured by increased METs and functional capacity (6MWT)       Able to understand and use rate of perceived exertion (RPE) scale Yes       Intervention Provide education and explanation on how to use RPE scale       Expected Outcomes Short Term: Able to use RPE daily in rehab to express subjective intensity level;Long Term:  Able to use RPE to guide intensity level when exercising independently       Knowledge and understanding of Target Heart Rate Range (  THRR) Yes       Intervention Provide education and explanation of THRR including how the numbers were predicted and where they are located for reference       Expected Outcomes Short Term: Able to state/look up THRR;Long Term: Able to use THRR to govern intensity when exercising independently;Short Term: Able to use daily as guideline for intensity in rehab       Understanding of Exercise Prescription Yes       Intervention Provide education, explanation, and written materials on patient's individual exercise prescription       Expected Outcomes Short Term: Able to explain program exercise  prescription;Long Term: Able to explain home exercise prescription to exercise independently                Exercise Goals Re-Evaluation :   Discharge Exercise Prescription (Final Exercise Prescription Changes):   Nutrition:  Target Goals: Understanding of nutrition guidelines, daily intake of sodium '1500mg'$ , cholesterol '200mg'$ , calories 30% from fat and 7% or less from saturated fats, daily to have 5 or more servings of fruits and vegetables.  Biometrics:  Pre Biometrics - 01/22/22 1217       Pre Biometrics   Waist Circumference 41.25 inches    Hip Circumference 46.25 inches    Waist to Hip Ratio 0.89 %    Triceps Skinfold 44 mm    % Body Fat 46.3 %    Grip Strength 18 kg    Flexibility 10.5 in    Single Leg Stand 14.31 seconds              Nutrition Therapy Plan and Nutrition Goals:   Nutrition Assessments:  MEDIFICTS Score Key: ?70 Need to make dietary changes  40-70 Heart Healthy Diet ? 40 Therapeutic Level Cholesterol Diet    Picture Your Plate Scores: <24 Unhealthy dietary pattern with much room for improvement. 41-50 Dietary pattern unlikely to meet recommendations for good health and room for improvement. 51-60 More healthful dietary pattern, with some room for improvement.  >60 Healthy dietary pattern, although there may be some specific behaviors that could be improved.    Nutrition Goals Re-Evaluation:   Nutrition Goals Re-Evaluation:   Nutrition Goals Discharge (Final Nutrition Goals Re-Evaluation):   Psychosocial: Target Goals: Acknowledge presence or absence of significant depression and/or stress, maximize coping skills, provide positive support system. Participant is able to verbalize types and ability to use techniques and skills needed for reducing stress and depression.  Initial Review & Psychosocial Screening:  Initial Psych Review & Screening - 01/22/22 1226       Initial Review   Current issues with None Identified       Family Dynamics   Good Support System? Yes   [name] has her husband and children who live nearby for support     Barriers   Psychosocial barriers to participate in program There are no identifiable barriers or psychosocial needs.;The patient should benefit from training in stress management and relaxation.      Screening Interventions   Interventions Encouraged to exercise;To provide support and resources with identified psychosocial needs;Provide feedback about the scores to participant    Expected Outcomes Short Term goal: Identification and review with participant of any Quality of Life or Depression concerns found by scoring the questionnaire.;Long Term goal: The participant improves quality of Life and PHQ9 Scores as seen by post scores and/or verbalization of changes             Quality of Life Scores:  Quality of Life - 01/22/22 1227       Quality of Life   Select Quality of Life      Quality of Life Scores   Health/Function Pre 29.2 %    Socioeconomic Pre 30 %    Psych/Spiritual Pre 27.43 %    Family Pre 28.8 %    GLOBAL Pre 28.97 %            Scores of 19 and below usually indicate a poorer quality of life in these areas.  A difference of  2-3 points is a clinically meaningful difference.  A difference of 2-3 points in the total score of the Quality of Life Index has been associated with significant improvement in overall quality of life, self-image, physical symptoms, and general health in studies assessing change in quality of life.  PHQ-9: Review Flowsheet       01/22/2022 06/10/2016  Depression screen PHQ 2/9  Decreased Interest 0 0  Down, Depressed, Hopeless 0 0  PHQ - 2 Score 0 0  Altered sleeping 1 -  Tired, decreased energy 0 -  Change in appetite 1 -  Feeling bad or failure about yourself  0 -  Trouble concentrating 0 -  Moving slowly or fidgety/restless 0 -  Suicidal thoughts 0 -  PHQ-9 Score 2 -  Difficult doing work/chores Not difficult at all -    Interpretation of Total Score  Total Score Depression Severity:  1-4 = Minimal depression, 5-9 = Mild depression, 10-14 = Moderate depression, 15-19 = Moderately severe depression, 20-27 = Severe depression   Psychosocial Evaluation and Intervention:   Psychosocial Re-Evaluation:   Psychosocial Discharge (Final Psychosocial Re-Evaluation):   Vocational Rehabilitation: Provide vocational rehab assistance to qualifying candidates.   Vocational Rehab Evaluation & Intervention:  Vocational Rehab - 01/22/22 1229       Initial Vocational Rehab Evaluation & Intervention   Assessment shows need for Vocational Rehabilitation No   Saffron is retired and does not need vocational rehab at this time            Education: Education Goals: Education classes will be provided on a weekly basis, covering required topics. Participant will state understanding/return demonstration of topics presented.     Core Videos: Exercise    Move It!  Clinical staff conducted group or individual video education with verbal and written material and guidebook.  Patient learns the recommended Pritikin exercise program. Exercise with the goal of living a long, healthy life. Some of the health benefits of exercise include controlled diabetes, healthier blood pressure levels, improved cholesterol levels, improved heart and lung capacity, improved sleep, and better body composition. Everyone should speak with their doctor before starting or changing an exercise routine.  Biomechanical Limitations Clinical staff conducted group or individual video education with verbal and written material and guidebook.  Patient learns how biomechanical limitations can impact exercise and how we can mitigate and possibly overcome limitations to have an impactful and balanced exercise routine.  Body Composition Clinical staff conducted group or individual video education with verbal and written material and guidebook.   Patient learns that body composition (ratio of muscle mass to fat mass) is a key component to assessing overall fitness, rather than body weight alone. Increased fat mass, especially visceral belly fat, can put [place] at increased risk for metabolic syndrome, type 2 diabetes, heart disease, and even death. It is recommended to combine diet and exercise (cardiovascular and resistance training) to improve your body composition. Seek guidance from  your physician and exercise physiologist before implementing an exercise routine.  Exercise Action Plan Clinical staff conducted group or individual video education with verbal and written material and guidebook.  Patient learns the recommended strategies to achieve and enjoy long-term exercise adherence, including variety, self-motivation, self-efficacy, and positive decision making. Benefits of exercise include fitness, good health, weight management, more energy, better sleep, less stress, and overall well-being.  Medical   Heart Disease Risk Reduction Clinical staff conducted group or individual video education with verbal and written material and guidebook.  Patient learns our heart is our most vital organ as it circulates oxygen, nutrients, white blood cells, and hormones throughout the entire body, and carries waste away. Data supports a plant-based eating plan like the Pritikin Program for its effectiveness in slowing progression of and reversing heart disease. The video provides a number of recommendations to address heart disease.   Metabolic Syndrome and Belly Fat  Clinical staff conducted group or individual video education with verbal and written material and guidebook.  Patient learns what metabolic syndrome is, how it leads to heart disease, and how one can reverse it and keep it from coming back. You have metabolic syndrome if you have 3 of the following 5 criteria: abdominal obesity, high blood pressure, high triglycerides, low HDL cholesterol, and  high blood sugar.  Hypertension and Heart Disease Clinical staff conducted group or individual video education with verbal and written material and guidebook.  Patient learns that high blood pressure, or hypertension, is very common in the [place]. Hypertension is largely due to excessive salt intake, but other important risk factors include being overweight, physical inactivity, drinking too much alcohol, smoking, and not eating enough potassium from fruits and vegetables. High blood pressure is a leading risk factor for heart attack, stroke, congestive heart failure, dementia, kidney failure, and premature death. Long-term effects of excessive salt intake include stiffening of the arteries and thickening of heart muscle and organ damage. Recommendations include ways to reduce hypertension and the risk of heart disease.  Diseases of Our Time - Focusing on Diabetes Clinical staff conducted group or individual video education with verbal and written material and guidebook.  Patient learns why the best way to stop diseases of our time is prevention, through food and other lifestyle changes. Medicine (such as prescription pills and surgeries) is often only a Band-Aid on the problem, not a long-term solution. Most common diseases of our time include obesity, type 2 diabetes, hypertension, heart disease, and cancer. The Pritikin Program is recommended and has been proven to help reduce, reverse, and/or prevent the damaging effects of metabolic syndrome.  Nutrition   Overview of the Pritikin Eating Plan  Clinical staff conducted group or individual video education with verbal and written material and guidebook.  Patient learns about the [place] for disease risk reduction. The [place] emphasizes a wide variety of unrefined, minimally-processed carbohydrates, like fruits, vegetables, whole grains, and legumes. Go, Caution, and Stop food choices are explained. Plant-based and lean  animal proteins are emphasized. Rationale provided for low sodium intake for blood pressure control, low added sugars for blood sugar stabilization, and low added fats and oils for coronary artery disease risk reduction and weight management.  Calorie Density  Clinical staff conducted group or individual video education with verbal and written material and guidebook.  Patient learns about calorie density and how it impacts the Pritikin Eating Plan. Knowing the characteristics of the food you choose will help you decide whether those  foods will lead to weight gain or weight loss, and whether you want to consume more or less of them. Weight loss is usually a side effect of the Pritikin Eating Plan because of its focus on low calorie-dense foods.  Label Reading  Clinical staff conducted group or individual video education with verbal and written material and guidebook.  Patient learns about the Pritikin recommended label reading guidelines and corresponding recommendations regarding calorie density, added sugars, sodium content, and whole grains.  Dining Out - Part 1  Clinical staff conducted group or individual video education with verbal and written material and guidebook.  Patient learns that restaurant meals can be sabotaging because they can be so high in calories, fat, sodium, and/or sugar. Patient learns recommended strategies on how to positively address this and avoid unhealthy pitfalls.  Facts on Fats  Clinical staff conducted group or individual video education with verbal and written material and guidebook.  Patient learns that lifestyle modifications can be just as effective, if not more so, as many medications for lowering your risk of heart disease. A Pritikin lifestyle can help to reduce your risk of inflammation and atherosclerosis (cholesterol build-up, or plaque, in the artery walls). Lifestyle interventions such as dietary choices and physical activity address the cause of  atherosclerosis. A review of the types of fats and their impact on blood cholesterol levels, along with dietary recommendations to reduce fat intake is also included.  Nutrition Action Plan  Clinical staff conducted group or individual video education with verbal and written material and guidebook.  Patient learns how to incorporate Pritikin recommendations into their lifestyle. Recommendations include planning and keeping personal health goals in mind as an important part of their success.  Healthy Mind-Set    Healthy Minds, Bodies, Hearts  Clinical staff conducted group or individual video education with verbal and written material and guidebook.  Patient learns how to identify when they are stressed. Video will discuss the impact of that stress, as well as the many benefits of stress management. Patient will also be introduced to stress management techniques. The way we think, act, and feel has an impact on our hearts.  How Our Thoughts Can Heal Our Hearts  Clinical staff conducted group or individual video education with verbal and written material and guidebook.  Patient learns that negative thoughts can cause depression and anxiety. This can result in negative lifestyle behavior and serious health problems. Cognitive behavioral therapy is an effective method to help control our thoughts in order to change and improve our emotional outlook.  Additional Videos:  Exercise    Improving Performance  Clinical staff conducted group or individual video education with verbal and written material and guidebook.  Patient learns to use a non-linear approach by alternating intensity levels and lengths of time spent exercising to help burn more calories and lose more body fat. Cardiovascular exercise helps improve heart health, metabolism, hormonal balance, blood sugar control, and recovery from fatigue. Resistance training improves strength, endurance, balance, coordination, reaction time, metabolism,  and muscle mass. Flexibility exercise improves circulation, posture, and balance. Seek guidance from your physician and exercise physiologist before implementing an exercise routine and learn your capabilities and proper form for all exercise.  Introduction to Yoga  Clinical staff conducted group or individual video education with verbal and written material and guidebook.  Patient learns about yoga, a discipline of the coming together of mind, breath, and body. The benefits of yoga include improved flexibility, improved range of motion, better posture and core  strength, increased lung function, weight loss, and positive self-image. Yoga's heart health benefits include lowered blood pressure, healthier heart rate, decreased cholesterol and triglyceride levels, improved immune function, and reduced stress. Seek guidance from your physician and exercise physiologist before implementing an exercise routine and learn your capabilities and proper form for all exercise.  Medical   Aging: Enhancing Your Quality of Life  Clinical staff conducted group or individual video education with verbal and written material and guidebook.  Patient learns key strategies and recommendations to stay in good physical health and enhance quality of life, such as prevention strategies, having an advocate, securing a [place], and keeping a list of medications and system for tracking them. It also discusses how to avoid risk for bone loss.  Biology of Weight Control  Clinical staff conducted group or individual video education with verbal and written material and guidebook.  Patient learns that weight gain occurs because we consume more calories than we burn (eating more, moving less). Even if your body weight is normal, you may have higher ratios of fat compared to muscle mass. Too much body fat puts you at increased risk for cardiovascular disease, heart attack, stroke, type 2 diabetes, and  obesity-related cancers. In addition to exercise, following the [place] can help reduce your risk.  Decoding Lab Results  Clinical staff conducted group or individual video education with verbal and written material and guidebook.  Patient learns that lab test reflects one measurement whose values change over time and are influenced by many factors, including medication, stress, sleep, exercise, food, hydration, pre-existing medical conditions, and more. It is recommended to use the knowledge from this video to become more involved with your lab results and evaluate your numbers to speak with your doctor.   Diseases of Our Time - Overview  Clinical staff conducted group or individual video education with verbal and written material and guidebook.  Patient learns that according to the CDC, 50% to 70% of chronic diseases (such as obesity, type 2 diabetes, elevated lipids, hypertension, and heart disease) are avoidable through lifestyle improvements including healthier food choices, listening to satiety cues, and increased physical activity.  Sleep Disorders Clinical staff conducted group or individual video education with verbal and written material and guidebook.  Patient learns how good quality and duration of sleep are important to overall health and well-being. Patient also learns about sleep disorders and how they impact health along with recommendations to address them, including discussing with a physician.  Nutrition  Dining Out - Part 2 Clinical staff conducted group or individual video education with verbal and written material and guidebook.  Patient learns how to plan ahead and communicate in order to maximize their dining experience in a healthy and nutritious manner. Included are recommended food choices based on the type of restaurant the patient is visiting.   Fueling a Best boy conducted group or individual video education with verbal and written  material and guidebook.  There is a strong connection between our food choices and our health. Diseases like obesity and type 2 diabetes are very prevalent and are in large-part due to lifestyle choices. The Pritikin Eating Plan provides plenty of food and hunger-curbing satisfaction. It is easy to follow, affordable, and helps reduce health risks.  Menu Workshop  Clinical staff conducted group or individual video education with verbal and written material and guidebook.  Patient learns that restaurant meals can sabotage health goals because they are  often packed with calories, fat, sodium, and sugar. Recommendations include strategies to plan ahead and to communicate with the manager, chef, or server to help order a healthier meal.  Planning Your Eating Strategy  Clinical staff conducted group or individual video education with verbal and written material and guidebook.  Patient learns about the [place] and its benefit of reducing the risk of disease. The [place] does not focus on calories. Instead, it emphasizes high-quality, nutrient-rich foods. By knowing the characteristics of the foods, we choose, we can determine their calorie density and make informed decisions.  Targeting Your Nutrition Priorities  Clinical staff conducted group or individual video education with verbal and written material and guidebook.  Patient learns that lifestyle habits have a tremendous impact on disease risk and progression. This video provides eating and physical activity recommendations based on your personal health goals, such as reducing LDL cholesterol, losing weight, preventing or controlling type 2 diabetes, and reducing high blood pressure.  Vitamins and Minerals  Clinical staff conducted group or individual video education with verbal and written material and guidebook.  Patient learns different ways to obtain key vitamins and minerals, including through a recommended healthy diet.  It is important to discuss all supplements you take with your doctor.   Healthy Mind-Set    Smoking Cessation  Clinical staff conducted group or individual video education with verbal and written material and guidebook.  Patient learns that cigarette smoking and tobacco addiction pose a serious health risk which affects millions of people. Stopping smoking will significantly reduce the risk of heart disease, lung disease, and many forms of cancer. Recommended strategies for quitting are covered, including working with your doctor to develop a successful plan.  Culinary   Becoming a Financial trader conducted group or individual video education with verbal and written material and guidebook.  Patient learns that cooking at home can be healthy, cost-effective, quick, and puts them in control. Keys to cooking healthy recipes will include looking at your recipe, assessing your equipment needs, planning ahead, making it simple, choosing cost-effective seasonal ingredients, and limiting the use of added fats, salts, and sugars.  Cooking - Breakfast and Snacks  Clinical staff conducted group or individual video education with verbal and written material and guidebook.  Patient learns how important breakfast is to satiety and nutrition through the entire day. Recommendations include key foods to eat during breakfast to help stabilize blood sugar levels and to prevent overeating at meals later in the day. Planning ahead is also a key component.  Cooking - Human resources officer conducted group or individual video education with verbal and written material and guidebook.  Patient learns eating strategies to improve overall health, including an approach to cook more at home. Recommendations include thinking of animal protein as a side on your plate rather than center stage and focusing instead on lower calorie dense options like vegetables, fruits, whole grains, and plant-based  proteins, such as beans. Making sauces in large quantities to freeze for later and leaving the skin on your vegetables are also recommended to maximize your experience.  Cooking - Healthy Salads and Dressing Clinical staff conducted group or individual video education with verbal and written material and guidebook.  Patient learns that vegetables, fruits, whole grains, and legumes are the foundations of the [place]. Recommendations include how to incorporate each of these in flavorful and healthy salads, and how to create homemade salad dressings. Proper handling of  ingredients is also covered. Cooking - Soups and [employer] - Soups and Desserts Clinical staff conducted group or individual video education with verbal and written material and guidebook.  Patient learns that Pritikin soups and desserts make for easy, nutritious, and delicious snacks and meal components that are low in sodium, fat, sugar, and calorie density, while high in vitamins, minerals, and filling fiber. Recommendations include simple and healthy ideas for soups and desserts.   Overview     The Pritikin Solution Program Overview Clinical staff conducted group or individual video education with verbal and written material and guidebook.  Patient learns that the results of the [place] Program have been documented in more than 100 articles published in peer-reviewed journals, and the benefits include reducing risk factors for (and, in some cases, even reversing) high cholesterol, high blood pressure, type 2 diabetes, obesity, and more! An overview of the three key pillars of the Pritikin Program will be covered: eating well, doing regular exercise, and having a healthy mind-set.  WORKSHOPS  Exercise: Exercise Basics: Building Your Action Plan Clinical staff led group instruction and group discussion with PowerPoint presentation and patient guidebook. To enhance the learning environment the use of posters,  models and videos may be added. At the conclusion of this workshop, patients will comprehend the difference between physical activity and exercise, as well as the benefits of incorporating both, into their routine. Patients will understand the FITT (Frequency, Intensity, Time, and Type) principle and how to use it to build an exercise action plan. In addition, safety concerns and other considerations for exercise and cardiac rehab will be addressed by the presenter. The purpose of this lesson is to promote a comprehensive and effective weekly exercise routine in order to improve patients' overall level of fitness.   Managing Heart Disease: Your Path to a Healthier Heart Clinical staff led group instruction and group discussion with PowerPoint presentation and patient guidebook. To enhance the learning environment the use of posters, models and videos may be added.At the conclusion of this workshop, patients will understand the anatomy and physiology of the heart. Additionally, they will understand how Pritikin's three pillars impact the risk factors, the progression, and the management of heart disease.  The purpose of this lesson is to provide a high-level overview of the heart, heart disease, and how the Pritikin lifestyle positively impacts risk factors.  Exercise Biomechanics Clinical staff led group instruction and group discussion with PowerPoint presentation and patient guidebook. To enhance the learning environment the use of posters, models and videos may be added. Patients will learn how the structural parts of their bodies function and how these functions impact their daily activities, movement, and exercise. Patients will learn how to promote a neutral spine, learn how to manage pain, and identify ways to improve their physical movement in order to promote healthy living. The purpose of this lesson is to expose patients to common physical limitations that impact physical activity.  Participants will learn practical ways to adapt and manage aches and pains, and to minimize their effect on regular exercise. Patients will learn how to maintain good posture while sitting, walking, and lifting.  Balance Training and Fall Prevention  Clinical staff led group instruction and group discussion with PowerPoint presentation and patient guidebook. To enhance the learning environment the use of posters, models and videos may be added. At the conclusion of this workshop, patients will understand the importance of their sensorimotor skills (vision, proprioception, and the vestibular system) in maintaining their ability  to balance as they age. Patients will apply a variety of balancing exercises that are appropriate for their current level of function. Patients will understand the common causes for poor balance, possible solutions to these problems, and ways to modify their physical environment in order to minimize their fall risk. The purpose of this lesson is to teach patients about the importance of maintaining balance as they age and ways to minimize their risk of falling.  WORKSHOPS   Nutrition:  Fueling a Scientist, research (physical sciences) led group instruction and group discussion with PowerPoint presentation and patient guidebook. To enhance the learning environment the use of posters, models and videos may be added. Patients will review the foundational principles of the [place] and understand what constitutes a serving size in each of the food groups. Patients will also learn Pritikin-friendly foods that are better choices when away from home and review make-ahead meal and snack options. Calorie density will be reviewed and applied to three nutrition priorities: weight maintenance, weight loss, and weight gain. The purpose of this lesson is to reinforce (in a group setting) the key concepts around what patients are recommended to eat and how to apply these guidelines when away  from home by planning and selecting Pritikin-friendly options. Patients will understand how calorie density may be adjusted for different weight management goals.  Mindful Eating  Clinical staff led group instruction and group discussion with PowerPoint presentation and patient guidebook. To enhance the learning environment the use of posters, models and videos may be added. Patients will briefly review the concepts of the [place] and the importance of low-calorie dense foods. The concept of mindful eating will be introduced as well as the importance of paying attention to internal hunger signals. Triggers for non-hunger eating and techniques for dealing with triggers will be explored. The purpose of this lesson is to provide patients with the opportunity to review the basic principles of the [place], discuss the value of eating mindfully and how to measure internal cues of hunger and fullness using the Hunger Scale. Patients will also discuss reasons for non-hunger eating and learn strategies to use for controlling emotional eating.  Targeting Your Nutrition Priorities Clinical staff led group instruction and group discussion with PowerPoint presentation and patient guidebook. To enhance the learning environment the use of posters, models and videos may be added. Patients will learn how to determine their genetic susceptibility to disease by reviewing their family history. Patients will gain insight into the importance of diet as part of an overall healthy lifestyle in mitigating the impact of genetics and other environmental insults. The purpose of this lesson is to provide patients with the opportunity to assess their personal nutrition priorities by looking at their family history, their own health history and current risk factors. Patients will also be able to discuss ways of prioritizing and modifying the [place] for their highest risk areas  Menu  Clinical  staff led group instruction and group discussion with PowerPoint presentation and patient guidebook. To enhance the learning environment the use of posters, models and videos may be added. Using menus brought in from [employer], or printed from [employer], patients will apply the [place] dining out guidelines that were presented in the [employer] video. Patients will also be able to practice these guidelines in a variety of provided scenarios. The purpose of this lesson is to provide patients with the opportunity to practice hands-on learning of the Pritikin  Dining Out guidelines with actual menus and practice scenarios.  Label Reading Clinical staff led group instruction and group discussion with PowerPoint presentation and patient guidebook. To enhance the learning environment the use of posters, models and videos may be added. Patients will review and discuss the Pritikin label reading guidelines presented in Pritikin's Label Reading Educational series video. Using fool labels brought in from local grocery stores and markets, patients will apply the label reading guidelines and determine if the packaged food meet the Pritikin guidelines. The purpose of this lesson is to provide patients with the opportunity to review, discuss, and practice hands-on learning of the Pritikin Label Reading guidelines with actual packaged food labels. [place] Workshops are designed to teach patients ways to prepare quick, simple, and affordable recipes at home. The importance of nutrition's role in chronic disease risk reduction is reflected in its emphasis in the overall Pritikin program. By learning how to prepare essential core Pritikin Eating Plan recipes, patients will increase control over what they eat; be able to customize the flavor of foods without the use of added salt, sugar, or fat; and improve the quality of the food they consume. By learning a set of  core recipes which are easily assembled, quickly prepared, and affordable, patients are more likely to prepare more healthy foods at home. These workshops focus on convenient breakfasts, simple entres, side dishes, and desserts which can be prepared with minimal effort and are consistent with nutrition recommendations for cardiovascular risk reduction. Cooking [employer] are taught by a Engineer, materials (RD) who has been trained by the [employer]. The chef or RD has a clear understanding of the importance of minimizing - if not completely eliminating - added fat, sugar, and sodium in recipes. Throughout the series of [place] Workshop sessions, patients will learn about healthy ingredients and efficient methods of cooking to build confidence in their capability to prepare    Cooking School weekly topics:  Adding Flavor- Sodium-Free  Fast and Healthy Breakfasts  Powerhouse Plant-Based Proteins  Satisfying Salads and Dressings  Simple Sides and Sauces  International Cuisine-Spotlight on the [place] Zones  Delicious Desserts  Savory Soups  Efficiency Cooking - Meals in a Snap  Tasty Appetizers and Snacks  Comforting Weekend Breakfasts  One-Pot Wonders   Fast Evening Meals  Easy [place] (Psychosocial): New Thoughts, New Behaviors Clinical staff led group instruction and group discussion with PowerPoint presentation and patient guidebook. To enhance the learning environment the use of posters, models and videos may be added. Patients will learn and practice techniques for developing effective health and lifestyle goals. Patients will be able to effectively apply the goal setting process learned to develop at least one new personal goal.  The purpose of this lesson is to expose patients to a new skill set of behavior modification techniques such as techniques setting SMART goals, overcoming  barriers, and achieving new thoughts and new behaviors.  Managing Moods and Relationships Clinical staff led group instruction and group discussion with PowerPoint presentation and patient guidebook. To enhance the learning environment the use of posters, models and videos may be added. Patients will learn how emotional and chronic stress factors can impact their health and relationships. They will learn healthy ways to manage their moods and utilize positive coping mechanisms. In addition, ICR patients will learn ways to improve communication skills. The purpose of this lesson is to  expose patients to ways of understanding how one's mood and health are intimately connected. Developing a healthy outlook can help build positive relationships and connections with others. Patients will understand the importance of utilizing effective communication skills that include actively listening and being heard. They will learn and understand the importance of the "4 Cs" and especially Connections in fostering of a Healthy Mind-Set.  Healthy Sleep for a Healthy Heart Clinical staff led group instruction and group discussion with PowerPoint presentation and patient guidebook. To enhance the learning environment the use of posters, models and videos may be added. At the conclusion of this workshop, patients will be able to demonstrate knowledge of the importance of sleep to overall health, well-being, and quality of life. They will understand the symptoms of, and treatments for, common sleep disorders. Patients will also be able to identify daytime and nighttime behaviors which impact sleep, and they will be able to apply these tools to help manage sleep-related challenges. The purpose of this lesson is to provide patients with a general overview of sleep and outline the importance of quality sleep. Patients will learn about a few of the most common sleep disorders. Patients will also be introduced to the concept of "sleep  hygiene," and discover ways to self-manage certain sleeping problems through simple daily behavior changes. Finally, the workshop will motivate patients by clarifying the links between quality sleep and their goals of heart-healthy living.   Recognizing and Reducing Stress Clinical staff led group instruction and group discussion with PowerPoint presentation and patient guidebook. To enhance the learning environment the use of posters, models and videos may be added. At the conclusion of this workshop, patients will be able to understand the types of stress reactions, differentiate between acute and chronic stress, and recognize the impact that chronic stress has on their health. They will also be able to apply different coping mechanisms, such as reframing negative self-talk. Patients will have the opportunity to practice a variety of stress management techniques, such as deep abdominal breathing, progressive muscle relaxation, and/or guided imagery.  The purpose of this lesson is to educate patients on the role of stress in their lives and to provide healthy techniques for coping with it.  Learning Barriers/Preferences:  Learning Barriers/Preferences - 01/22/22 1227       Learning Barriers/Preferences   Learning Barriers Sight;Hearing   pt wears glasses and bilateral hearing aids   Learning Preferences Group Instruction;Individual Instruction;Skilled Demonstration;Verbal Instruction             Education Topics:  Knowledge Questionnaire Score:  Knowledge Questionnaire Score - 01/22/22 1229       Knowledge Questionnaire Score   Pre Score 23/24             Core Components/Risk Factors/Patient Goals at Admission:  Personal Goals and Risk Factors at Admission - 01/22/22 1230       Core Components/Risk Factors/Patient Goals on Admission    Weight Management Yes;Obesity;Weight Loss    Intervention Weight Management: Develop a combined nutrition and exercise program designed to  reach desired caloric intake, while maintaining appropriate intake of nutrient and fiber, sodium and fats, and appropriate energy expenditure required for the weight goal.;Weight Management: Provide education and appropriate resources to help participant work on and attain dietary goals.;Weight Management/Obesity: Establish reasonable short term and long term weight goals.;Obesity: Provide education and appropriate resources to help participant work on and attain dietary goals.    Admit Weight 168 lb 6.9 oz (76.4 kg)    Goal  Weight: Short Term 158 lb (71.7 kg)    Expected Outcomes Short Term: Continue to assess and modify interventions until short term weight is achieved;Long Term: Adherence to nutrition and physical activity/exercise program aimed toward attainment of established weight goal;Weight Loss: Understanding of general recommendations for a balanced deficit meal plan, which promotes 1-2 lb weight loss per week and includes a negative energy balance of [phone] kcal/d;Understanding recommendations for meals to include 15-35% energy as protein, 25-35% energy from fat, 35-60% energy from carbohydrates, less than '200mg'$  of dietary cholesterol, 20-35 gm of total fiber daily;Understanding of distribution of calorie intake throughout the day with the consumption of 4-5 meals/snacks    Diabetes Yes    Intervention Provide education about signs/symptoms and action to take for hypo/hyperglycemia.;Provide education about proper nutrition, including hydration, and aerobic/resistive exercise prescription along with prescribed medications to achieve blood glucose in normal ranges: Fasting glucose 65-99 mg/dL    Expected Outcomes Short Term: Participant verbalizes understanding of the signs/symptoms and immediate care of hyper/hypoglycemia, proper foot care and importance of medication, aerobic/resistive exercise and nutrition plan for blood glucose control.;Long Term: Attainment of HbA1C < 7%.    Heart Failure  Yes    Intervention Provide a combined exercise and nutrition program that is supplemented with education, support and counseling about heart failure. Directed toward relieving symptoms such as shortness of breath, decreased exercise tolerance, and extremity edema.    Expected Outcomes Short term: Attendance in program 2-3 days a week with increased exercise capacity. Reported lower sodium intake. Reported increased fruit and vegetable intake. Reports medication compliance.;Improve functional capacity of life;Short term: Daily weights obtained and reported for increase. Utilizing diuretic protocols set by physician.;Long term: Adoption of self-care skills and reduction of barriers for early signs and symptoms recognition and intervention leading to self-care maintenance.    Hypertension Yes    Intervention Provide education on lifestyle modifcations including regular physical activity/exercise, weight management, moderate sodium restriction and increased consumption of fresh fruit, vegetables, and low fat dairy, alcohol moderation, and smoking cessation.;Monitor prescription use compliance.    Expected Outcomes Short Term: Continued assessment and intervention until BP is < 140/52m HG in hypertensive participants. < 130/841mHG in hypertensive participants with diabetes, heart failure or chronic kidney disease.;Long Term: Maintenance of blood pressure at goal levels.    Lipids Yes    Intervention Provide education and support for participant on nutrition & aerobic/resistive exercise along with prescribed medications to achieve LDL '70mg'$ , HDL >'40mg'$ .    Expected Outcomes Short Term: Participant states understanding of desired cholesterol values and is compliant with medications prescribed. Participant is following exercise prescription and nutrition guidelines.;Long Term: Cholesterol controlled with medications as prescribed, with individualized exercise RX and with personalized nutrition plan. Value goals:  LDL < '70mg'$ , HDL > 40 mg.             Core Components/Risk Factors/Patient Goals Review:    Core Components/Risk Factors/Patient Goals at Discharge (Final Review):    ITP Comments:  ITP Comments     Row Name 01/22/22 1218           ITP Comments Dr. TrFransico Himedical director. Introduction to pritikin education program/ intensive cardiac rehab. Initital orientation packet reviewed with the patient.                Comments: Participant attended orientation for the cardiac rehabilitation program on  01/22/2022  to perform initial intake and exercise walk test. Patient introduced to the [employer] and orientation packet  was reviewed. Completed 6-minute walk test, measurements, initial ITP, and exercise prescription. Vital signs stable. Telemetry-normal sinus rhythm prolonged T-wave, asymptomatic. Prolonged T-wave present on most recent 12-lead EKG as well.   Service time was from 1010 to 1156.     [name], MS 01/22/2022 12:48 PM

## 2022-01-22 NOTE — Progress Notes (Signed)
Cardiac Rehab Medication Review   Does the patient  feel that his/her medications are working for him/her?  YES  Has the patient been experiencing any side effects to the medications prescribed?  NO  Does the patient measure his/her own blood pressure or blood glucose at home?  YES   Does the patient have any problems obtaining medications due to transportation or finances?   NO  Understanding of regimen: excellent Understanding of indications: excellent Potential of compliance: excellent    Comments: Shannon Moore understands her medications well and has no issues obtaining them. She checks her blood pressure and CBG every morning at home.     [name], MS 01/22/2022 12:41 PM

## 2022-01-25 ENCOUNTER — Encounter (HOSPITAL_COMMUNITY)
Admission: RE | Admit: 2022-01-25 | Discharge: 2022-01-25 | Disposition: A | Discharge status: Home / Self Care | Payer: Medicare Other | Source: Ambulatory Visit | Attending: Cardiology | Admitting: Cardiology

## 2022-01-25 ENCOUNTER — Ambulatory Visit (HOSPITAL_COMMUNITY): Payer: Medicare HMO

## 2022-01-25 ENCOUNTER — Encounter (HOSPITAL_COMMUNITY): Payer: Medicare Other

## 2022-01-25 DIAGNOSIS — Z955 Presence of coronary angioplasty implant and graft: Secondary | ICD-10-CM | POA: Diagnosis not present

## 2022-01-25 LAB — GLUCOSE, CAPILLARY
Glucose-Capillary: 166 mg/dL — ABNORMAL HIGH (ref 70–99)
Glucose-Capillary: 209 mg/dL — ABNORMAL HIGH (ref 70–99)

## 2022-01-25 NOTE — Progress Notes (Signed)
Daily Session Note  Patient Details  Name: Shannon Moore MRN: [id number] Date of Birth: September 12, 1945 Referring Provider:   Flowsheet Row INTENSIVE CARDIAC REHAB ORIENT from 01/22/2022 in [facility] [place]  Referring Provider Gwyndolyn Kaufman, MD       Encounter Date: 01/25/2022  Check In:  Session Check In - 01/25/22 1036       Check-In   Supervising physician immediately available to respond to emergencies [facility] MD immediately available    Physician(s) Ambrose Pancoast, NP    Location [facility]    Staff Present Sandy Salaam, MS, Exercise Physiologist;Jaslyne Beeck, RN, Deland Pretty, MS, ACSM-CEP, Exercise Physiologist;David Lilyan Punt, MS, ACSM-CEP, CCRP, Exercise Physiologist;Jetta Walker BS, ACSM-CEP, Exercise Physiologist    Virtual Visit No    Medication changes reported     No    Comments --    Fall or balance concerns reported    No    Tobacco Cessation No Change    Warm-up and Cool-down Performed as group-led instruction    Resistance Training Performed Yes    VAD Patient? No    PAD/SET Patient? No      Pain Assessment   Currently in Pain? No/denies    Pain Score 0-No pain    Multiple Pain Sites No             Capillary Blood Glucose: Results for orders placed or performed during the hospital encounter of 01/25/22 (from the past 24 hour(s))  Glucose, capillary     Status: Abnormal   Collection Time: 01/25/22 11:26 AM  Result Value Ref Range   Glucose-Capillary 166 (H) 70 - 99 mg/dL     Exercise Prescription Changes - 01/25/22 1027       Response to Exercise   Blood Pressure (Admit) 130/60    Blood Pressure (Exercise) 156/80    Blood Pressure (Exit) 108/70    Heart Rate (Admit) 64 bpm    Heart Rate (Exercise) 92 bpm    Heart Rate (Exit) 68 bpm    Rating of Perceived Exertion (Exercise) 10    Symptoms None    Comments Off to a good start with exercise.    Duration Continue with  30 min of aerobic exercise without signs/symptoms of physical distress.    Intensity THRR unchanged      Progression   Progression Continue to progress workloads to maintain intensity without signs/symptoms of physical distress.    Average METs 2.3      Resistance Training   Training Prescription Yes    Weight 2    Reps 10-15    Time 10 Minutes      Interval Training   Interval Training No      NuStep   Level 1    SPM 81    Minutes 15    METs 2.3      Arm Ergometer   Level 1    Watts 1    RPM 39    Minutes 15             Social History   Tobacco Use  Smoking Status Former   Packs/day: 1.00   Years: 8.00   Total pack years: 8.00   Types: Cigarettes   Quit date: 11/10/1977   Years since quitting: 44.2  Smokeless Tobacco Never    Goals Met:  Exercise tolerated well No report of concerns or symptoms today Strength training completed today  Goals Unmet:  Not Applicable  Comments: [name] started cardiac rehab today.  Pt tolerated light exercise without difficulty. VSS, telemetry-Sinus Rhythm, asymptomatic.  Medication list reconciled. Pt denies barriers to medicaiton compliance.  PSYCHOSOCIAL ASSESSMENT:  PHQ-2. Pt exhibits positive coping skills, hopeful outlook with supportive family. No psychosocial needs identified at this time, no psychosocial interventions necessary.    Pt enjoys reading mysteries, watching TV and spending time with her family.   Pt oriented to exercise equipment and routine.    Understanding verbalized. [name] RN BSN    Dr. Fransico Him is Medical Director for Cardiac Rehab at [facility].

## 2022-01-27 ENCOUNTER — Encounter (HOSPITAL_COMMUNITY): Payer: Medicare Other

## 2022-01-27 ENCOUNTER — Ambulatory Visit (HOSPITAL_COMMUNITY): Payer: Medicare HMO

## 2022-01-27 ENCOUNTER — Encounter (HOSPITAL_COMMUNITY)
Admission: RE | Admit: 2022-01-27 | Discharge: 2022-01-27 | Disposition: A | Discharge status: Home / Self Care | Payer: Medicare Other | Source: Ambulatory Visit | Attending: Cardiology | Admitting: Cardiology

## 2022-01-27 DIAGNOSIS — Z955 Presence of coronary angioplasty implant and graft: Secondary | ICD-10-CM

## 2022-01-27 LAB — GLUCOSE, CAPILLARY
Glucose-Capillary: 123 mg/dL — ABNORMAL HIGH (ref 70–99)
Glucose-Capillary: 179 mg/dL — ABNORMAL HIGH (ref 70–99)

## 2022-01-29 ENCOUNTER — Encounter (HOSPITAL_COMMUNITY): Payer: Medicare Other

## 2022-01-29 ENCOUNTER — Ambulatory Visit (HOSPITAL_COMMUNITY): Payer: Medicare HMO

## 2022-01-29 ENCOUNTER — Encounter (HOSPITAL_COMMUNITY)
Admission: RE | Admit: 2022-01-29 | Discharge: 2022-01-29 | Disposition: A | Discharge status: Home / Self Care | Payer: Medicare Other | Source: Ambulatory Visit | Attending: Cardiology | Admitting: Cardiology

## 2022-01-29 DIAGNOSIS — Z955 Presence of coronary angioplasty implant and graft: Secondary | ICD-10-CM | POA: Diagnosis not present

## 2022-01-29 LAB — GLUCOSE, CAPILLARY: Glucose-Capillary: 116 mg/dL — ABNORMAL HIGH (ref 70–99)

## 2022-02-01 ENCOUNTER — Ambulatory Visit (HOSPITAL_COMMUNITY): Payer: Medicare HMO

## 2022-02-01 ENCOUNTER — Encounter (HOSPITAL_COMMUNITY)
Admission: RE | Admit: 2022-02-01 | Discharge: 2022-02-01 | Disposition: A | Discharge status: Home / Self Care | Payer: Medicare Other | Source: Ambulatory Visit | Attending: Cardiology | Admitting: Cardiology

## 2022-02-01 ENCOUNTER — Encounter (HOSPITAL_COMMUNITY): Payer: Medicare Other

## 2022-02-01 DIAGNOSIS — Z955 Presence of coronary angioplasty implant and graft: Secondary | ICD-10-CM

## 2022-02-03 ENCOUNTER — Ambulatory Visit (HOSPITAL_COMMUNITY): Payer: Medicare HMO

## 2022-02-03 ENCOUNTER — Encounter (HOSPITAL_COMMUNITY): Payer: Medicare Other

## 2022-02-03 ENCOUNTER — Encounter (HOSPITAL_COMMUNITY)
Admission: RE | Admit: 2022-02-03 | Discharge: 2022-02-03 | Disposition: A | Discharge status: Home / Self Care | Payer: Medicare Other | Source: Ambulatory Visit | Attending: Cardiology | Admitting: Cardiology

## 2022-02-03 DIAGNOSIS — Z955 Presence of coronary angioplasty implant and graft: Secondary | ICD-10-CM | POA: Diagnosis not present

## 2022-02-05 ENCOUNTER — Ambulatory Visit (HOSPITAL_COMMUNITY): Payer: Medicare HMO

## 2022-02-05 ENCOUNTER — Encounter (HOSPITAL_COMMUNITY): Payer: Medicare Other

## 2022-02-05 ENCOUNTER — Encounter (HOSPITAL_COMMUNITY)
Admission: RE | Admit: 2022-02-05 | Discharge: 2022-02-05 | Disposition: A | Discharge status: Home / Self Care | Payer: Medicare Other | Source: Ambulatory Visit | Attending: Cardiology | Admitting: Cardiology

## 2022-02-05 DIAGNOSIS — Z955 Presence of coronary angioplasty implant and graft: Secondary | ICD-10-CM

## 2022-02-08 ENCOUNTER — Ambulatory Visit (HOSPITAL_COMMUNITY): Payer: Medicare HMO

## 2022-02-08 ENCOUNTER — Encounter (HOSPITAL_COMMUNITY)
Admission: RE | Admit: 2022-02-08 | Discharge: 2022-02-08 | Disposition: A | Discharge status: Home / Self Care | Payer: Medicare Other | Source: Ambulatory Visit | Attending: Cardiology | Admitting: Cardiology

## 2022-02-08 ENCOUNTER — Encounter (HOSPITAL_COMMUNITY): Payer: Medicare Other

## 2022-02-08 DIAGNOSIS — Z955 Presence of coronary angioplasty implant and graft: Secondary | ICD-10-CM | POA: Diagnosis not present

## 2022-02-09 NOTE — Progress Notes (Signed)
Cardiac Individual Treatment Plan  Patient Details  Name: Shannon Moore MRN: [id number] Date of Birth: 09/06/1945 Referring Provider:   [place] from 01/22/2022 in [facility] for Heart, Vascular, & [place]  Referring Provider Gwyndolyn Kaufman, MD       Initial Encounter Date:  [place] from 01/22/2022 in [facility] for Heart, Vascular, & [facility]  Date 01/22/22       Visit Diagnosis: Status post coronary artery stent placement  Patient's Home Medications on Admission:  Current Outpatient Medications:    allopurinol (ZYLOPRIM) 300 MG tablet, Take 300 mg by mouth daily with supper., Disp: , Rfl:    aspirin EC 81 MG tablet, 81 mg daily., Disp: , Rfl:    b complex vitamins capsule, Take 1 capsule by mouth daily., Disp: , Rfl:    Biotin 10000 MCG TABS, Take 10,000 mcg by mouth daily., Disp: 30 tablet, Rfl:    cetirizine (ZYRTEC) 10 MG tablet, Take 10 mg by mouth daily., Disp: , Rfl:    Cholecalciferol (VITAMIN D3) 50 MCG (2000 UT) TABS, Take 2,000 Units by mouth daily with supper., Disp: , Rfl:    estradiol (ESTRACE) 0.1 MG/GM vaginal cream, Place 1 Applicatorful vaginally as needed., Disp: , Rfl:    fluticasone (FLONASE) 50 MCG/ACT nasal spray, Place 2 sprays into both nostrils daily as needed for allergies or rhinitis., Disp: , Rfl:    gabapentin (NEURONTIN) 300 MG capsule, Take 300-600 mg by mouth at bedtime., Disp: , Rfl:    Glucosamine-Chondroitin 500-400 MG CAPS, Take 1 capsule by mouth in the morning and at bedtime., Disp: , Rfl:    magnesium gluconate (MAGONATE) 500 MG tablet, Take 500 mg by mouth daily with supper., Disp: , Rfl:    metFORMIN (GLUCOPHAGE-XR) 500 MG 24 hr tablet, Take 500 mg by mouth 2 (two) times daily with a meal., Disp: , Rfl:    metoprolol succinate (TOPROL-XL) 25 MG 24 hr tablet, Take 25 mg by mouth daily with supper., Disp: , Rfl:     Multiple Vitamins-Minerals (MULTIVITAMIN WITH MINERALS) tablet, Take 1 tablet by mouth daily., Disp: , Rfl:    nitroGLYCERIN (NITROSTAT) 0.4 MG SL tablet, Place 1 tablet (0.4 mg total) under the tongue every 5 (five) minutes as needed for chest pain., Disp: 25 tablet, Rfl: 3   olmesartan (BENICAR) 20 MG tablet, Take 0.5 tablets (10 mg total) by mouth daily., Disp: 45 tablet, Rfl: 3   pantoprazole (PROTONIX) 40 MG tablet, Take 40 mg by mouth as needed (for indigestion)., Disp: , Rfl:    Probiotic Product (PROBIOTIC PO), Take 1 tablet by mouth daily., Disp: , Rfl:    rosuvastatin (CRESTOR) 20 MG tablet, Take 1 tablet (20 mg total) by mouth daily., Disp: 90 tablet, Rfl: 3   TACROLIMUS EX, Apply 1 application. topically daily as needed (Psoriasis). Ointment, Disp: , Rfl:    ticagrelor (BRILINTA) 90 MG TABS tablet, Take 1 tablet (90 mg total) by mouth 2 (two) times daily., Disp: 180 tablet, Rfl: 3   tiZANidine (ZANAFLEX) 2 MG tablet, Take 2 mg by mouth 3 (three) times daily as needed for muscle spasms., Disp: , Rfl:   Past Medical History: Past Medical History:  Diagnosis Date   Abnormal mammogram    Adenomatous polyp of colon    Anginal pain (HCC)    Arthritis    Hands, Knees hips, feet,    Bell's palsy  CAD (coronary artery disease)    a. s/p DES to prox-RCA in 06/2021 following abnormal Coronary CT   CHF (congestive heart failure) ([place]) 1990s   pt feels it is from Zoloft or a virus   Diabetic peripheral neuropathy ([place])    Diastolic dysfunction    Diverticulitis    DM (diabetes mellitus) ([place])    Dysphagia    Esophageal obstruction    Essential hypertension 01/08/2014   Female cystocele    GERD (gastroesophageal reflux disease)    Goiter    Gout    History of 2019 novel coronavirus disease (COVID-19)    HTN (hypertension)    Hyperlipidemia 01/08/2014   Migraines    Mixed incontinence urge and stress    Obesity (BMI 30-39.9) 01/08/2014   Osteoarthritis of knee    Thyroid  nodule    Type 2 diabetes mellitus without complication ([place]) 49/44/9675    Tobacco Use: Social History   Tobacco Use  Smoking Status Former   Packs/day: 1.00   Years: 8.00   Total pack years: 8.00   Types: Cigarettes   Quit date: 11/10/1977   Years since quitting: 44.2  Smokeless Tobacco Never    Labs: Review Flowsheet       Latest Ref Rng & Units 07/31/2019 06/12/2021 06/29/2021  Labs for ITP Cardiac and Pulmonary Rehab  Cholestrol 100 - 199 mg/dL - - 120   LDL (calc) 0 - 99 mg/dL - - 45   HDL-C >39 mg/dL - - 49   Trlycerides 0 - 149 mg/dL - - 155   Hemoglobin A1c 4.8 - 5.6 % 6.9  7.0  -    Capillary Blood Glucose: Lab Results  Component Value Date   GLUCAP 116 (H) 01/29/2022   GLUCAP 123 (H) 01/27/2022   GLUCAP 179 (H) 01/27/2022   GLUCAP 166 (H) 01/25/2022   GLUCAP 209 (H) 01/25/2022     Exercise Target Goals: Exercise Program Goal: Individual exercise prescription set using results from initial 6 min walk test and THRR while considering  patient's activity barriers and safety.   Exercise Prescription Goal: Initial exercise prescription builds to 30-45 minutes a day of aerobic activity, 2-3 days per week.  Home exercise guidelines will be given to patient during program as part of exercise prescription that the participant will acknowledge.  Activity Barriers & Risk Stratification:  Activity Barriers & Cardiac Risk Stratification - 01/22/22 1223       Activity Barriers & Cardiac Risk Stratification   Activity Barriers Arthritis;Right Hip Replacement;Left Knee Replacement;Neck/Spine Problems;Joint Problems;Deconditioning;Balance Concerns;History of Falls    Cardiac Risk Stratification High   <5 METS 6MWT            6 Minute Walk:  6 Minute Walk     Row Name 01/22/22 1222         6 Minute Walk   Phase Initial     Distance 1080 feet     Walk Time 6 minutes     # of Rest Breaks 0     MPH 2.05     METS 2.28     RPE 9     Perceived Dyspnea  1   towards end of test     VO2 Peak 7.98     Symptoms Yes (comment)     Comments SOB at end of test     Resting HR 63 bpm     Resting BP 138/66     Resting Oxygen Saturation  97 %  Exercise Oxygen Saturation  during 6 min walk 97 %     Max Ex. HR 116 bpm     Max Ex. BP 154/60     2 Minute Post BP 140/70              Oxygen Initial Assessment:   Oxygen Re-Evaluation:   Oxygen Discharge (Final Oxygen Re-Evaluation):   Initial Exercise Prescription:  Initial Exercise Prescription - 01/22/22 1200       Date of Initial Exercise RX and Referring Provider   Date 01/22/22    Referring Provider Gwyndolyn Kaufman, MD    Expected Discharge Date 03/19/22      Arm Ergometer   Level 1    RPM 50    Minutes 15    METs 2      T5 Nustep   Level 1    SPM 75    Minutes 15    METs 2      Prescription Details   Frequency (times per week) 3    Duration Progress to 30 minutes of continuous aerobic without signs/symptoms of physical distress      Intensity   THRR 40-80% of Max Heartrate 58-115    Ratings of Perceived Exertion 11-13    Perceived Dyspnea 0-4      Progression   Progression Continue to progress workloads to maintain intensity without signs/symptoms of physical distress.      Resistance Training   Training Prescription Yes    Weight 2    Reps 10-15             Perform Capillary Blood Glucose checks as needed.  Exercise Prescription Changes:   Exercise Prescription Changes     Row Name 01/25/22 1027 02/05/22 1019           Response to Exercise   Blood Pressure (Admit) 130/60 130/56      Blood Pressure (Exercise) 156/80 142/80      Blood Pressure (Exit) 108/70 128/72      Heart Rate (Admit) 64 bpm 73 bpm      Heart Rate (Exercise) 92 bpm 110 bpm      Heart Rate (Exit) 68 bpm 82 bpm      Rating of Perceived Exertion (Exercise) 10 11      Symptoms None None      Comments Off to a good start with exercise. --      Duration Continue with 30 min  of aerobic exercise without signs/symptoms of physical distress. Continue with 30 min of aerobic exercise without signs/symptoms of physical distress.      Intensity THRR unchanged THRR unchanged        Progression   Progression Continue to progress workloads to maintain intensity without signs/symptoms of physical distress. Continue to progress workloads to maintain intensity without signs/symptoms of physical distress.      Average METs 2.3 2.6        Resistance Training   Training Prescription Yes Yes      Weight 2 2      Reps 10-15 10-15      Time 10 Minutes 10 Minutes        Interval Training   Interval Training No No        NuStep   Level 1 1      SPM 81 99      Minutes 15 15      METs 2.3 3        Arm Ergometer   Level 1  1      Watts 1 --      RPM 39 --      Minutes 15 15      METs -- 2.2               Exercise Comments:   Exercise Comments     Row Name 01/25/22 1129 02/08/22 1118         Exercise Comments Patient tolerated 1st session of exericse well rating low intensity exercise very light. Oriented to warm-up and cool-down stretches with a chair in front for balance as needed. Reviewed METs and goals with patient. Stretching handout given.               Exercise Goals and Review:   Exercise Goals     Row Name 01/22/22 1225             Exercise Goals   Increase Physical Activity Yes       Intervention Provide advice, education, support and counseling about physical activity/exercise needs.;Develop an individualized exercise prescription for aerobic and resistive training based on initial evaluation findings, risk stratification, comorbidities and participant's personal goals.       Expected Outcomes Short Term: Attend rehab on a regular basis to increase amount of physical activity.;Long Term: Exercising regularly at least 3-5 days a week.;Long Term: Add in home exercise to make exercise part of routine and to increase amount of physical  activity.       Increase Strength and Stamina Yes       Intervention Provide advice, education, support and counseling about physical activity/exercise needs.;Develop an individualized exercise prescription for aerobic and resistive training based on initial evaluation findings, risk stratification, comorbidities and participant's personal goals.       Expected Outcomes Short Term: Increase workloads from initial exercise prescription for resistance, speed, and METs.;Short Term: Perform resistance training exercises routinely during rehab and add in resistance training at home;Long Term: Improve cardiorespiratory fitness, muscular endurance and strength as measured by increased METs and functional capacity (6MWT)       Able to understand and use rate of perceived exertion (RPE) scale Yes       Intervention Provide education and explanation on how to use RPE scale       Expected Outcomes Short Term: Able to use RPE daily in rehab to express subjective intensity level;Long Term:  Able to use RPE to guide intensity level when exercising independently       Knowledge and understanding of Target Heart Rate Range (THRR) Yes       Intervention Provide education and explanation of THRR including how the numbers were predicted and where they are located for reference       Expected Outcomes Short Term: Able to state/look up THRR;Long Term: Able to use THRR to govern intensity when exercising independently;Short Term: Able to use daily as guideline for intensity in rehab       Understanding of Exercise Prescription Yes       Intervention Provide education, explanation, and written materials on patient's individual exercise prescription       Expected Outcomes Short Term: Able to explain program exercise prescription;Long Term: Able to explain home exercise prescription to exercise independently                Exercise Goals Re-Evaluation :  Exercise Goals Re-Evaluation     Row Name 01/25/22 1129  02/08/22 1118           Exercise Goal  Re-Evaluation   Exercise Goals Review Increase Physical Activity;Able to understand and use rate of perceived exertion (RPE) scale;Increase Strength and Stamina Increase Physical Activity;Able to understand and use rate of perceived exertion (RPE) scale;Increase Strength and Stamina      Comments Patient able to understand and use RPE scale appropriately. Patient is not currently exercising at home. She can walk outside weather permitting. Patient's goal is to get in better shape. Wants to increase workloads on both machines.      Expected Outcomes Progress workloads as tolerated to help increase strength and stamina and achieve personal health and fitness goals. Increase workloads on Nustep and Arm ergometer. Add walking at home to achieve 150 minutes aerobic exercise/week.               Discharge Exercise Prescription (Final Exercise Prescription Changes):  Exercise Prescription Changes - 02/05/22 1019       Response to Exercise   Blood Pressure (Admit) 130/56    Blood Pressure (Exercise) 142/80    Blood Pressure (Exit) 128/72    Heart Rate (Admit) 73 bpm    Heart Rate (Exercise) 110 bpm    Heart Rate (Exit) 82 bpm    Rating of Perceived Exertion (Exercise) 11    Symptoms None    Duration Continue with 30 min of aerobic exercise without signs/symptoms of physical distress.    Intensity THRR unchanged      Progression   Progression Continue to progress workloads to maintain intensity without signs/symptoms of physical distress.    Average METs 2.6      Resistance Training   Training Prescription Yes    Weight 2    Reps 10-15    Time 10 Minutes      Interval Training   Interval Training No      NuStep   Level 1    SPM 99    Minutes 15    METs 3      Arm Ergometer   Level 1    Minutes 15    METs 2.2             Nutrition:  Target Goals: Understanding of nutrition guidelines, daily intake of sodium '1500mg'$ , cholesterol  '200mg'$ , calories 30% from fat and 7% or less from saturated fats, daily to have 5 or more servings of fruits and vegetables.  Biometrics:  Pre Biometrics - 01/22/22 1217       Pre Biometrics   Waist Circumference 41.25 inches    Hip Circumference 46.25 inches    Waist to Hip Ratio 0.89 %    Triceps Skinfold 44 mm    % Body Fat 46.3 %    Grip Strength 18 kg    Flexibility 10.5 in    Single Leg Stand 14.31 seconds              Nutrition Therapy Plan and Nutrition Goals:  Nutrition Therapy & Goals - 01/25/22 1108       Nutrition Therapy   Diet Heart Healthy/Carbohydrate Consistent    Drug/Food Interactions Statins/Certain Fruits      Personal Nutrition Goals   Nutrition Goal Patient to identify strategies for improving cardiovascualr risk by attending the [employer]rit[employer]in education and nutrition series weekly    Personal Goal #2 Patient to improve diet quality by using the plate method as a daily guide for meal planning to include lean protein/plant protein, fruits, vegetables, whole grains, and nonfat dairy as part of a balanced diet    Personal Goal #3  Patient to identify strategies for managing blood sugar and A1c <7%    Personal Goal #4 Patient to reduce sodium intake to '1500mg'$  per day    Comments Shannon Moore will benefit from participation in intensive cardiac rehab for nutrition education, exercise, and lifestyle modification.      Intervention Plan   Intervention Prescribe, educate and counsel regarding individualized specific dietary modifications aiming towards targeted core components such as weight, hypertension, lipid management, diabetes, heart failure and other comorbidities.;Nutrition handout(s) given to patient.    Expected Outcomes Short Term Goal: Understand basic principles of dietary content, such as calories, fat, sodium, cholesterol and nutrients.;Long Term Goal: Adherence to prescribed nutrition plan.             Nutrition Assessments:  Nutrition Assessments -  01/25/22 1333       Rate Your Plate Scores   Pre Score 58            MEDIFICTS Score Key: ?70 Need to make dietary changes  40-70 Heart Healthy Diet ? 40 Therapeutic Level Cholesterol Diet   Flowsheet Row INTENSIVE CARDIAC REHAB from 01/25/2022 in [facility] for Heart, Vascular, & Lung Health  Picture Your Plate Total Score on Admission 58      Picture Your Plate Scores: <95 Unhealthy dietary pattern with much room for improvement. 41-50 Dietary pattern unlikely to meet recommendations for good health and room for improvement. 51-60 More healthful dietary pattern, with some room for improvement.  >60 Healthy dietary pattern, although there may be some specific behaviors that could be improved.    Nutrition Goals Re-Evaluation:  Nutrition Goals Re-Evaluation     [place] Name 01/25/22 1108             Goals   Current Weight 171 lb 15.3 oz (78 kg)       Comment Triglycerides 155, A1c 7.0       Expected Outcome Shannon Moore will benefit from participation in intensive cardiac rehab for nutrition education, exercise, and lifestyle modification.                Nutrition Goals Re-Evaluation:  Nutrition Goals Re-Evaluation     [place] Name 01/25/22 1108             Goals   Current Weight 171 lb 15.3 oz (78 kg)       Comment Triglycerides 155, A1c 7.0       Expected Outcome Shannon Moore will benefit from participation in intensive cardiac rehab for nutrition education, exercise, and lifestyle modification.                Nutrition Goals Discharge (Final Nutrition Goals Re-Evaluation):  Nutrition Goals Re-Evaluation - 01/25/22 1108       Goals   Current Weight 171 lb 15.3 oz (78 kg)    Comment Triglycerides 155, A1c 7.0    Expected Outcome Shannon Moore will benefit from participation in intensive cardiac rehab for nutrition education, exercise, and lifestyle modification.             Psychosocial: Target Goals: Acknowledge presence or absence of  significant depression and/or stress, maximize coping skills, provide positive support system. Participant is able to verbalize types and ability to use techniques and skills needed for reducing stress and depression.  Initial Review & Psychosocial Screening:  Initial Psych Review & Screening - 01/22/22 1226       Initial Review   Current issues with None Identified      Family Dynamics   Good  Support System? Yes   Shannon Moore has her husband and children who live nearby for support     Barriers   Psychosocial barriers to participate in program There are no identifiable barriers or psychosocial needs.;The patient should benefit from training in stress management and relaxation.      Screening Interventions   Interventions Encouraged to exercise;To provide support and resources with identified psychosocial needs;Provide feedback about the scores to participant    Expected Outcomes Short Term goal: Identification and review with participant of any Quality of Life or Depression concerns found by scoring the questionnaire.;Long Term goal: The participant improves quality of Life and PHQ9 Scores as seen by post scores and/or verbalization of changes             Quality of Life Scores:  Quality of Life - 01/22/22 1227       Quality of Life   Select Quality of Life      Quality of Life Scores   Health/Function Pre 29.2 %    Socioeconomic Pre 30 %    Psych/Spiritual Pre 27.43 %    Family Pre 28.8 %    GLOBAL Pre 28.97 %            Scores of 19 and below usually indicate a poorer quality of life in these areas.  A difference of  2-3 points is a clinically meaningful difference.  A difference of 2-3 points in the total score of the Quality of Life Index has been associated with significant improvement in overall quality of life, self-image, physical symptoms, and general health in studies assessing change in quality of life.  PHQ-9: Review Flowsheet       01/22/2022 06/10/2016   Depression screen PHQ 2/9  Decreased Interest 0 0  Down, Depressed, Hopeless 0 0  PHQ - 2 Score 0 0  Altered sleeping 1 -  Tired, decreased energy 0 -  Change in appetite 1 -  Feeling bad or failure about yourself  0 -  Trouble concentrating 0 -  Moving slowly or fidgety/restless 0 -  Suicidal thoughts 0 -  PHQ-9 Score 2 -  Difficult doing work/chores Not difficult at all -   Interpretation of Total Score  Total Score Depression Severity:  1-4 = Minimal depression, 5-9 = Mild depression, 10-14 = Moderate depression, 15-19 = Moderately severe depression, 20-27 = Severe depression   Psychosocial Evaluation and Intervention:   Psychosocial Re-Evaluation:  Psychosocial Re-Evaluation     [place] Name 01/25/22 1436             Psychosocial Re-Evaluation   Current issues with None Identified       Interventions Encouraged to attend Cardiac Rehabilitation for the exercise       Continue Psychosocial Services  No Follow up required                Psychosocial Discharge (Final Psychosocial Re-Evaluation):  Psychosocial Re-Evaluation - 01/25/22 1436       Psychosocial Re-Evaluation   Current issues with None Identified    Interventions Encouraged to attend Cardiac Rehabilitation for the exercise    Continue Psychosocial Services  No Follow up required             Vocational Rehabilitation: Provide vocational rehab assistance to qualifying candidates.   Vocational Rehab Evaluation & Intervention:  Vocational Rehab - 01/22/22 1229       Initial Vocational Rehab Evaluation & Intervention   Assessment shows need for Vocational Rehabilitation No   Katharine Look  is retired and does not need vocational rehab at this time            Education: Education Goals: Education classes will be provided on a weekly basis, covering required topics. Participant will state understanding/return demonstration of topics presented.    Education     Row Name 01/25/22 1200      Education   Cardiac Education Topics Pritikin   IT sales professional Nutrition   Nutrition Workshop Label Reading   Instruction Review Code 1- Verbalizes Understanding   Class Start Time 1145   Class Stop Time 1230   Class Time Calculation (min) 45 min    [place] Name 01/27/22 1300     Education   Cardiac Education Topics [employer]   Educator Dietitian   Weekly Topic [place]   Instruction Review Code 1- Verbalizes Understanding   Class Start Time 1138   Class Stop Time 1225   Class Time Calculation (min) 47 min    [place] Name 01/29/22 1200     Education   Cardiac Education Topics Pritikin   Lexicographer Nutrition   Nutrition Other  Label Reading   Instruction Review Code 1- Verbalizes Understanding   Class Start Time 1140   Class Stop Time 1225   Class Time Calculation (min) 45 min    [place] Name 02/01/22 [place]   [employer]     Workshops   Educator Exercise Physiologist   Select Psychosocial   Psychosocial Workshop Other  Focused goals and sustainable changes   Instruction Review Code 1- Verbalizes Understanding   Class Start Time 1138   Class Stop Time 1220   Class Time Calculation (min) 42 min    [place] Name 02/03/22 1600     Education   Cardiac Education Topics [employer]   Educator Dietitian   Weekly Topic [employer]   Instruction Review Code 1- Verbalizes Understanding   Class Start Time 1145   Class Stop Time 1221   Class Time Calculation (min) 36 min    [place] Name 02/05/22 1300     Education   Cardiac Education Topics Pritikin   Select Core Videos     Core Videos   Educator Dietitian   Nutrition Calorie Density   Instruction Review Code 1- Verbalizes Understanding   Class Start Time 1142    Class Stop Time 1223   Class Time Calculation (min) 41 min    [place] Name 02/08/22 [place]   Select Workshops     Workshops   Educator Exercise Physiologist   Select Exercise   Exercise Workshop Exercise Basics: Building Your Action Plan   Instruction Review Code 1- Verbalizes Understanding   Class Start Time 1148   Class Stop Time 1232   Class Time Calculation (min) 44 min            Core Videos: Exercise    Move It!  Clinical staff conducted group or individual video education with verbal and written material and guidebook.  Patient learns the recommended Pritikin exercise program. Exercise with the goal of living a long, healthy life. Some of the  health benefits of exercise include controlled diabetes, healthier blood pressure levels, improved cholesterol levels, improved heart and lung capacity, improved sleep, and better body composition. Everyone should speak with their doctor before starting or changing an exercise routine.  Biomechanical Limitations Clinical staff conducted group or individual video education with verbal and written material and guidebook.  Patient learns how biomechanical limitations can impact exercise and how we can mitigate and possibly overcome limitations to have an impactful and balanced exercise routine.  Body Composition Clinical staff conducted group or individual video education with verbal and written material and guidebook.  Patient learns that body composition (ratio of muscle mass to fat mass) is a key component to assessing overall fitness, rather than body weight alone. Increased fat mass, especially visceral belly fat, can put [place] at increased risk for metabolic syndrome, type 2 diabetes, heart disease, and even death. It is recommended to combine diet and exercise (cardiovascular and resistance training) to improve your body composition. Seek guidance from your physician and exercise  physiologist before implementing an exercise routine.  Exercise Action Plan Clinical staff conducted group or individual video education with verbal and written material and guidebook.  Patient learns the recommended strategies to achieve and enjoy long-term exercise adherence, including variety, self-motivation, self-efficacy, and positive decision making. Benefits of exercise include fitness, good health, weight management, more energy, better sleep, less stress, and overall well-being.  Medical   Heart Disease Risk Reduction Clinical staff conducted group or individual video education with verbal and written material and guidebook.  Patient learns our heart is our most vital organ as it circulates oxygen, nutrients, white blood cells, and hormones throughout the entire body, and carries waste away. Data supports a plant-based eating plan like the Pritikin Program for its effectiveness in slowing progression of and reversing heart disease. The video provides a number of recommendations to address heart disease.   Metabolic Syndrome and Belly Fat  Clinical staff conducted group or individual video education with verbal and written material and guidebook.  Patient learns what metabolic syndrome is, how it leads to heart disease, and how one can reverse it and keep it from coming back. You have metabolic syndrome if you have 3 of the following 5 criteria: abdominal obesity, high blood pressure, high triglycerides, low HDL cholesterol, and high blood sugar.  Hypertension and Heart Disease Clinical staff conducted group or individual video education with verbal and written material and guidebook.  Patient learns that high blood pressure, or hypertension, is very common in the [place]. Hypertension is largely due to excessive salt intake, but other important risk factors include being overweight, physical inactivity, drinking too much alcohol, smoking, and not eating enough potassium from fruits  and vegetables. High blood pressure is a leading risk factor for heart attack, stroke, congestive heart failure, dementia, kidney failure, and premature death. Long-term effects of excessive salt intake include stiffening of the arteries and thickening of heart muscle and organ damage. Recommendations include ways to reduce hypertension and the risk of heart disease.  Diseases of Our Time - Focusing on Diabetes Clinical staff conducted group or individual video education with verbal and written material and guidebook.  Patient learns why the best way to stop diseases of our time is prevention, through food and other lifestyle changes. Medicine (such as prescription pills and surgeries) is often only a Band-Aid on the problem, not a long-term solution. Most common diseases of our time include obesity, type 2 diabetes, hypertension, heart disease, and cancer. The Pritikin  Program is recommended and has been proven to help reduce, reverse, and/or prevent the damaging effects of metabolic syndrome.  Nutrition   Overview of the Pritikin Eating Plan  Clinical staff conducted group or individual video education with verbal and written material and guidebook.  Patient learns about the [place] for disease risk reduction. The [place] emphasizes a wide variety of unrefined, minimally-processed carbohydrates, like fruits, vegetables, whole grains, and legumes. Go, Caution, and Stop food choices are explained. Plant-based and lean animal proteins are emphasized. Rationale provided for low sodium intake for blood pressure control, low added sugars for blood sugar stabilization, and low added fats and oils for coronary artery disease risk reduction and weight management.  Calorie Density  Clinical staff conducted group or individual video education with verbal and written material and guidebook.  Patient learns about calorie density and how it impacts the Pritikin Eating Plan. Knowing the  characteristics of the food you choose will help you decide whether those foods will lead to weight gain or weight loss, and whether you want to consume more or less of them. Weight loss is usually a side effect of the Pritikin Eating Plan because of its focus on low calorie-dense foods.  Label Reading  Clinical staff conducted group or individual video education with verbal and written material and guidebook.  Patient learns about the Pritikin recommended label reading guidelines and corresponding recommendations regarding calorie density, added sugars, sodium content, and whole grains.  Dining Out - Part 1  Clinical staff conducted group or individual video education with verbal and written material and guidebook.  Patient learns that restaurant meals can be sabotaging because they can be so high in calories, fat, sodium, and/or sugar. Patient learns recommended strategies on how to positively address this and avoid unhealthy pitfalls.  Facts on Fats  Clinical staff conducted group or individual video education with verbal and written material and guidebook.  Patient learns that lifestyle modifications can be just as effective, if not more so, as many medications for lowering your risk of heart disease. A Pritikin lifestyle can help to reduce your risk of inflammation and atherosclerosis (cholesterol build-up, or plaque, in the artery walls). Lifestyle interventions such as dietary choices and physical activity address the cause of atherosclerosis. A review of the types of fats and their impact on blood cholesterol levels, along with dietary recommendations to reduce fat intake is also included.  Nutrition Action Plan  Clinical staff conducted group or individual video education with verbal and written material and guidebook.  Patient learns how to incorporate Pritikin recommendations into their lifestyle. Recommendations include planning and keeping personal health goals in mind as an important  part of their success.  Healthy Mind-Set    Healthy Minds, Bodies, Hearts  Clinical staff conducted group or individual video education with verbal and written material and guidebook.  Patient learns how to identify when they are stressed. Video will discuss the impact of that stress, as well as the many benefits of stress management. Patient will also be introduced to stress management techniques. The way we think, act, and feel has an impact on our hearts.  How Our Thoughts Can Heal Our Hearts  Clinical staff conducted group or individual video education with verbal and written material and guidebook.  Patient learns that negative thoughts can cause depression and anxiety. This can result in negative lifestyle behavior and serious health problems. Cognitive behavioral therapy is an effective method to help control our thoughts in order to  change and improve our emotional outlook.  Additional Videos:  Exercise    Improving Performance  Clinical staff conducted group or individual video education with verbal and written material and guidebook.  Patient learns to use a non-linear approach by alternating intensity levels and lengths of time spent exercising to help burn more calories and lose more body fat. Cardiovascular exercise helps improve heart health, metabolism, hormonal balance, blood sugar control, and recovery from fatigue. Resistance training improves strength, endurance, balance, coordination, reaction time, metabolism, and muscle mass. Flexibility exercise improves circulation, posture, and balance. Seek guidance from your physician and exercise physiologist before implementing an exercise routine and learn your capabilities and proper form for all exercise.  Introduction to Yoga  Clinical staff conducted group or individual video education with verbal and written material and guidebook.  Patient learns about yoga, a discipline of the coming together of mind, breath, and body. The  benefits of yoga include improved flexibility, improved range of motion, better posture and core strength, increased lung function, weight loss, and positive self-image. Yoga's heart health benefits include lowered blood pressure, healthier heart rate, decreased cholesterol and triglyceride levels, improved immune function, and reduced stress. Seek guidance from your physician and exercise physiologist before implementing an exercise routine and learn your capabilities and proper form for all exercise.  Medical   Aging: Enhancing Your Quality of Life  Clinical staff conducted group or individual video education with verbal and written material and guidebook.  Patient learns key strategies and recommendations to stay in good physical health and enhance quality of life, such as prevention strategies, having an advocate, securing a [place], and keeping a list of medications and system for tracking them. It also discusses how to avoid risk for bone loss.  Biology of Weight Control  Clinical staff conducted group or individual video education with verbal and written material and guidebook.  Patient learns that weight gain occurs because we consume more calories than we burn (eating more, moving less). Even if your body weight is normal, you may have higher ratios of fat compared to muscle mass. Too much body fat puts you at increased risk for cardiovascular disease, heart attack, stroke, type 2 diabetes, and obesity-related cancers. In addition to exercise, following the [place] can help reduce your risk.  Decoding Lab Results  Clinical staff conducted group or individual video education with verbal and written material and guidebook.  Patient learns that lab test reflects one measurement whose values change over time and are influenced by many factors, including medication, stress, sleep, exercise, food, hydration, pre-existing medical conditions, and more. It is  recommended to use the knowledge from this video to become more involved with your lab results and evaluate your numbers to speak with your doctor.   Diseases of Our Time - Overview  Clinical staff conducted group or individual video education with verbal and written material and guidebook.  Patient learns that according to the CDC, 50% to 70% of chronic diseases (such as obesity, type 2 diabetes, elevated lipids, hypertension, and heart disease) are avoidable through lifestyle improvements including healthier food choices, listening to satiety cues, and increased physical activity.  Sleep Disorders Clinical staff conducted group or individual video education with verbal and written material and guidebook.  Patient learns how good quality and duration of sleep are important to overall health and well-being. Patient also learns about sleep disorders and how they impact health along with recommendations to address them, including discussing  with a physician.  Nutrition  Dining Out - Part 2 Clinical staff conducted group or individual video education with verbal and written material and guidebook.  Patient learns how to plan ahead and communicate in order to maximize their dining experience in a healthy and nutritious manner. Included are recommended food choices based on the type of restaurant the patient is visiting.   Fueling a Best boy conducted group or individual video education with verbal and written material and guidebook.  There is a strong connection between our food choices and our health. Diseases like obesity and type 2 diabetes are very prevalent and are in large-part due to lifestyle choices. The Pritikin Eating Plan provides plenty of food and hunger-curbing satisfaction. It is easy to follow, affordable, and helps reduce health risks.  Menu Workshop  Clinical staff conducted group or individual video education with verbal and written material and guidebook.   Patient learns that restaurant meals can sabotage health goals because they are often packed with calories, fat, sodium, and sugar. Recommendations include strategies to plan ahead and to communicate with the manager, chef, or server to help order a healthier meal.  Planning Your Eating Strategy  Clinical staff conducted group or individual video education with verbal and written material and guidebook.  Patient learns about the [place] and its benefit of reducing the risk of disease. The [place] does not focus on calories. Instead, it emphasizes high-quality, nutrient-rich foods. By knowing the characteristics of the foods, we choose, we can determine their calorie density and make informed decisions.  Targeting Your Nutrition Priorities  Clinical staff conducted group or individual video education with verbal and written material and guidebook.  Patient learns that lifestyle habits have a tremendous impact on disease risk and progression. This video provides eating and physical activity recommendations based on your personal health goals, such as reducing LDL cholesterol, losing weight, preventing or controlling type 2 diabetes, and reducing high blood pressure.  Vitamins and Minerals  Clinical staff conducted group or individual video education with verbal and written material and guidebook.  Patient learns different ways to obtain key vitamins and minerals, including through a recommended healthy diet. It is important to discuss all supplements you take with your doctor.   Healthy Mind-Set    Smoking Cessation  Clinical staff conducted group or individual video education with verbal and written material and guidebook.  Patient learns that cigarette smoking and tobacco addiction pose a serious health risk which affects millions of people. Stopping smoking will significantly reduce the risk of heart disease, lung disease, and many forms of cancer. Recommended  strategies for quitting are covered, including working with your doctor to develop a successful plan.  Culinary   Becoming a Financial trader conducted group or individual video education with verbal and written material and guidebook.  Patient learns that cooking at home can be healthy, cost-effective, quick, and puts them in control. Keys to cooking healthy recipes will include looking at your recipe, assessing your equipment needs, planning ahead, making it simple, choosing cost-effective seasonal ingredients, and limiting the use of added fats, salts, and sugars.  Cooking - Breakfast and Snacks  Clinical staff conducted group or individual video education with verbal and written material and guidebook.  Patient learns how important breakfast is to satiety and nutrition through the entire day. Recommendations include key foods to eat during breakfast to help stabilize blood sugar levels and to prevent overeating at meals later  in the day. Planning ahead is also a key component.  Cooking - Human resources officer conducted group or individual video education with verbal and written material and guidebook.  Patient learns eating strategies to improve overall health, including an approach to cook more at home. Recommendations include thinking of animal protein as a side on your plate rather than center stage and focusing instead on lower calorie dense options like vegetables, fruits, whole grains, and plant-based proteins, such as beans. Making sauces in large quantities to freeze for later and leaving the skin on your vegetables are also recommended to maximize your experience.  Cooking - Healthy Salads and Dressing Clinical staff conducted group or individual video education with verbal and written material and guidebook.  Patient learns that vegetables, fruits, whole grains, and legumes are the foundations of the [place]. Recommendations include how to  incorporate each of these in flavorful and healthy salads, and how to create homemade salad dressings. Proper handling of ingredients is also covered. Cooking - Soups and [employer] - Soups and Desserts Clinical staff conducted group or individual video education with verbal and written material and guidebook.  Patient learns that Pritikin soups and desserts make for easy, nutritious, and delicious snacks and meal components that are low in sodium, fat, sugar, and calorie density, while high in vitamins, minerals, and filling fiber. Recommendations include simple and healthy ideas for soups and desserts.   Overview     The Pritikin Solution Program Overview Clinical staff conducted group or individual video education with verbal and written material and guidebook.  Patient learns that the results of the [place] Program have been documented in more than 100 articles published in peer-reviewed journals, and the benefits include reducing risk factors for (and, in some cases, even reversing) high cholesterol, high blood pressure, type 2 diabetes, obesity, and more! An overview of the three key pillars of the Pritikin Program will be covered: eating well, doing regular exercise, and having a healthy mind-set.  WORKSHOPS  Exercise: Exercise Basics: Building Your Action Plan Clinical staff led group instruction and group discussion with PowerPoint presentation and patient guidebook. To enhance the learning environment the use of posters, models and videos may be added. At the conclusion of this workshop, patients will comprehend the difference between physical activity and exercise, as well as the benefits of incorporating both, into their routine. Patients will understand the FITT (Frequency, Intensity, Time, and Type) principle and how to use it to build an exercise action plan. In addition, safety concerns and other considerations for exercise and cardiac rehab will be addressed by the  presenter. The purpose of this lesson is to promote a comprehensive and effective weekly exercise routine in order to improve patients' overall level of fitness.   Managing Heart Disease: Your Path to a Healthier Heart Clinical staff led group instruction and group discussion with PowerPoint presentation and patient guidebook. To enhance the learning environment the use of posters, models and videos may be added.At the conclusion of this workshop, patients will understand the anatomy and physiology of the heart. Additionally, they will understand how Pritikin's three pillars impact the risk factors, the progression, and the management of heart disease.  The purpose of this lesson is to provide a high-level overview of the heart, heart disease, and how the Pritikin lifestyle positively impacts risk factors.  Exercise Biomechanics Clinical staff led group instruction and group discussion with PowerPoint presentation and patient guidebook. To enhance the learning environment the  use of posters, models and videos may be added. Patients will learn how the structural parts of their bodies function and how these functions impact their daily activities, movement, and exercise. Patients will learn how to promote a neutral spine, learn how to manage pain, and identify ways to improve their physical movement in order to promote healthy living. The purpose of this lesson is to expose patients to common physical limitations that impact physical activity. Participants will learn practical ways to adapt and manage aches and pains, and to minimize their effect on regular exercise. Patients will learn how to maintain good posture while sitting, walking, and lifting.  Balance Training and Fall Prevention  Clinical staff led group instruction and group discussion with PowerPoint presentation and patient guidebook. To enhance the learning environment the use of posters, models and videos may be added. At the  conclusion of this workshop, patients will understand the importance of their sensorimotor skills (vision, proprioception, and the vestibular system) in maintaining their ability to balance as they age. Patients will apply a variety of balancing exercises that are appropriate for their current level of function. Patients will understand the common causes for poor balance, possible solutions to these problems, and ways to modify their physical environment in order to minimize their fall risk. The purpose of this lesson is to teach patients about the importance of maintaining balance as they age and ways to minimize their risk of falling.  WORKSHOPS   Nutrition:  Fueling a Scientist, research (physical sciences) led group instruction and group discussion with PowerPoint presentation and patient guidebook. To enhance the learning environment the use of posters, models and videos may be added. Patients will review the foundational principles of the [place] and understand what constitutes a serving size in each of the food groups. Patients will also learn Pritikin-friendly foods that are better choices when away from home and review make-ahead meal and snack options. Calorie density will be reviewed and applied to three nutrition priorities: weight maintenance, weight loss, and weight gain. The purpose of this lesson is to reinforce (in a group setting) the key concepts around what patients are recommended to eat and how to apply these guidelines when away from home by planning and selecting Pritikin-friendly options. Patients will understand how calorie density may be adjusted for different weight management goals.  Mindful Eating  Clinical staff led group instruction and group discussion with PowerPoint presentation and patient guidebook. To enhance the learning environment the use of posters, models and videos may be added. Patients will briefly review the concepts of the [place] and the  importance of low-calorie dense foods. The concept of mindful eating will be introduced as well as the importance of paying attention to internal hunger signals. Triggers for non-hunger eating and techniques for dealing with triggers will be explored. The purpose of this lesson is to provide patients with the opportunity to review the basic principles of the [place], discuss the value of eating mindfully and how to measure internal cues of hunger and fullness using the Hunger Scale. Patients will also discuss reasons for non-hunger eating and learn strategies to use for controlling emotional eating.  Targeting Your Nutrition Priorities Clinical staff led group instruction and group discussion with PowerPoint presentation and patient guidebook. To enhance the learning environment the use of posters, models and videos may be added. Patients will learn how to determine their genetic susceptibility to disease by reviewing their family history. Patients will gain insight into  the importance of diet as part of an overall healthy lifestyle in mitigating the impact of genetics and other environmental insults. The purpose of this lesson is to provide patients with the opportunity to assess their personal nutrition priorities by looking at their family history, their own health history and current risk factors. Patients will also be able to discuss ways of prioritizing and modifying the [place] for their highest risk areas  Menu  Clinical staff led group instruction and group discussion with PowerPoint presentation and patient guidebook. To enhance the learning environment the use of posters, models and videos may be added. Using menus brought in from [employer], or printed from [employer], patients will apply the [place] dining out guidelines that were presented in the [employer] video. Patients will also be able to practice these guidelines in a variety of  provided scenarios. The purpose of this lesson is to provide patients with the opportunity to practice hands-on learning of the [place] with actual menus and practice scenarios.  Label Reading Clinical staff led group instruction and group discussion with PowerPoint presentation and patient guidebook. To enhance the learning environment the use of posters, models and videos may be added. Patients will review and discuss the Pritikin label reading guidelines presented in Pritikin's Label Reading Educational series video. Using fool labels brought in from local grocery stores and markets, patients will apply the label reading guidelines and determine if the packaged food meet the Pritikin guidelines. The purpose of this lesson is to provide patients with the opportunity to review, discuss, and practice hands-on learning of the Pritikin Label Reading guidelines with actual packaged food labels. [place] Workshops are designed to teach patients ways to prepare quick, simple, and affordable recipes at home. The importance of nutrition's role in chronic disease risk reduction is reflected in its emphasis in the overall Pritikin program. By learning how to prepare essential core Pritikin Eating Plan recipes, patients will increase control over what they eat; be able to customize the flavor of foods without the use of added salt, sugar, or fat; and improve the quality of the food they consume. By learning a set of core recipes which are easily assembled, quickly prepared, and affordable, patients are more likely to prepare more healthy foods at home. These workshops focus on convenient breakfasts, simple entres, side dishes, and desserts which can be prepared with minimal effort and are consistent with nutrition recommendations for cardiovascular risk reduction. Cooking [employer] are taught by a Engineer, materials (RD) who has been trained by the  [employer]. The chef or RD has a clear understanding of the importance of minimizing - if not completely eliminating - added fat, sugar, and sodium in recipes. Throughout the series of [place] Workshop sessions, patients will learn about healthy ingredients and efficient methods of cooking to build confidence in their capability to prepare    Cooking School weekly topics:  Adding Flavor- Sodium-Free  Fast and Healthy Breakfasts  Powerhouse Plant-Based Proteins  Satisfying Salads and Dressings  Simple Sides and Sauces  International Cuisine-Spotlight on the [place] Zones  Delicious Desserts  Savory Soups  Efficiency Cooking - Meals in a Agricultural consultant Appetizers and Snacks  Comforting Weekend Breakfasts  One-Pot Wonders   Fast Evening Meals  Easy [place] (Psychosocial): New Thoughts, New Behaviors Clinical staff led group instruction and group  discussion with PowerPoint presentation and patient guidebook. To enhance the learning environment the use of posters, models and videos may be added. Patients will learn and practice techniques for developing effective health and lifestyle goals. Patients will be able to effectively apply the goal setting process learned to develop at least one new personal goal.  The purpose of this lesson is to expose patients to a new skill set of behavior modification techniques such as techniques setting SMART goals, overcoming barriers, and achieving new thoughts and new behaviors.  Managing Moods and Relationships Clinical staff led group instruction and group discussion with PowerPoint presentation and patient guidebook. To enhance the learning environment the use of posters, models and videos may be added. Patients will learn how emotional and chronic stress factors can impact their health and relationships. They will learn healthy ways to manage their moods and utilize  positive coping mechanisms. In addition, ICR patients will learn ways to improve communication skills. The purpose of this lesson is to expose patients to ways of understanding how one's mood and health are intimately connected. Developing a healthy outlook can help build positive relationships and connections with others. Patients will understand the importance of utilizing effective communication skills that include actively listening and being heard. They will learn and understand the importance of the "4 Cs" and especially Connections in fostering of a Healthy Mind-Set.  Healthy Sleep for a Healthy Heart Clinical staff led group instruction and group discussion with PowerPoint presentation and patient guidebook. To enhance the learning environment the use of posters, models and videos may be added. At the conclusion of this workshop, patients will be able to demonstrate knowledge of the importance of sleep to overall health, well-being, and quality of life. They will understand the symptoms of, and treatments for, common sleep disorders. Patients will also be able to identify daytime and nighttime behaviors which impact sleep, and they will be able to apply these tools to help manage sleep-related challenges. The purpose of this lesson is to provide patients with a general overview of sleep and outline the importance of quality sleep. Patients will learn about a few of the most common sleep disorders. Patients will also be introduced to the concept of "sleep hygiene," and discover ways to self-manage certain sleeping problems through simple daily behavior changes. Finally, the workshop will motivate patients by clarifying the links between quality sleep and their goals of heart-healthy living.   Recognizing and Reducing Stress Clinical staff led group instruction and group discussion with PowerPoint presentation and patient guidebook. To enhance the learning environment the use of posters, models and  videos may be added. At the conclusion of this workshop, patients will be able to understand the types of stress reactions, differentiate between acute and chronic stress, and recognize the impact that chronic stress has on their health. They will also be able to apply different coping mechanisms, such as reframing negative self-talk. Patients will have the opportunity to practice a variety of stress management techniques, such as deep abdominal breathing, progressive muscle relaxation, and/or guided imagery.  The purpose of this lesson is to educate patients on the role of stress in their lives and to provide healthy techniques for coping with it.  Learning Barriers/Preferences:  Learning Barriers/Preferences - 01/22/22 1227       Learning Barriers/Preferences   Learning Barriers Sight;Hearing   pt wears glasses and bilateral hearing aids   Learning Preferences Group Instruction;Individual Instruction;Skilled Demonstration;Verbal Instruction  Education Topics:  Knowledge Questionnaire Score:  Knowledge Questionnaire Score - 01/22/22 1229       Knowledge Questionnaire Score   Pre Score 23/24             Core Components/Risk Factors/Patient Goals at Admission:  Personal Goals and Risk Factors at Admission - 01/22/22 1230       Core Components/Risk Factors/Patient Goals on Admission    Weight Management Yes;Obesity;Weight Loss    Intervention Weight Management: Develop a combined nutrition and exercise program designed to reach desired caloric intake, while maintaining appropriate intake of nutrient and fiber, sodium and fats, and appropriate energy expenditure required for the weight goal.;Weight Management: Provide education and appropriate resources to help participant work on and attain dietary goals.;Weight Management/Obesity: Establish reasonable short term and long term weight goals.;Obesity: Provide education and appropriate resources to help participant work on  and attain dietary goals.    Admit Weight 168 lb 6.9 oz (76.4 kg)    Goal Weight: Short Term 158 lb (71.7 kg)    Expected Outcomes Short Term: Continue to assess and modify interventions until short term weight is achieved;Long Term: Adherence to nutrition and physical activity/exercise program aimed toward attainment of established weight goal;Weight Loss: Understanding of general recommendations for a balanced deficit meal plan, which promotes 1-2 lb weight loss per week and includes a negative energy balance of [phone] kcal/d;Understanding recommendations for meals to include 15-35% energy as protein, 25-35% energy from fat, 35-60% energy from carbohydrates, less than '200mg'$  of dietary cholesterol, 20-35 gm of total fiber daily;Understanding of distribution of calorie intake throughout the day with the consumption of 4-5 meals/snacks    Diabetes Yes    Intervention Provide education about signs/symptoms and action to take for hypo/hyperglycemia.;Provide education about proper nutrition, including hydration, and aerobic/resistive exercise prescription along with prescribed medications to achieve blood glucose in normal ranges: Fasting glucose 65-99 mg/dL    Expected Outcomes Short Term: Participant verbalizes understanding of the signs/symptoms and immediate care of hyper/hypoglycemia, proper foot care and importance of medication, aerobic/resistive exercise and nutrition plan for blood glucose control.;Long Term: Attainment of HbA1C < 7%.    Heart Failure Yes    Intervention Provide a combined exercise and nutrition program that is supplemented with education, support and counseling about heart failure. Directed toward relieving symptoms such as shortness of breath, decreased exercise tolerance, and extremity edema.    Expected Outcomes Short term: Attendance in program 2-3 days a week with increased exercise capacity. Reported lower sodium intake. Reported increased fruit and vegetable intake. Reports  medication compliance.;Improve functional capacity of life;Short term: Daily weights obtained and reported for increase. Utilizing diuretic protocols set by physician.;Long term: Adoption of self-care skills and reduction of barriers for early signs and symptoms recognition and intervention leading to self-care maintenance.    Hypertension Yes    Intervention Provide education on lifestyle modifcations including regular physical activity/exercise, weight management, moderate sodium restriction and increased consumption of fresh fruit, vegetables, and low fat dairy, alcohol moderation, and smoking cessation.;Monitor prescription use compliance.    Expected Outcomes Short Term: Continued assessment and intervention until BP is < 140/68m HG in hypertensive participants. < 130/887mHG in hypertensive participants with diabetes, heart failure or chronic kidney disease.;Long Term: Maintenance of blood pressure at goal levels.    Lipids Yes    Intervention Provide education and support for participant on nutrition & aerobic/resistive exercise along with prescribed medications to achieve LDL '70mg'$ , HDL >'40mg'$ .    Expected Outcomes Short Term:  Participant states understanding of desired cholesterol values and is compliant with medications prescribed. Participant is following exercise prescription and nutrition guidelines.;Long Term: Cholesterol controlled with medications as prescribed, with individualized exercise RX and with personalized nutrition plan. Value goals: LDL < '70mg'$ , HDL > 40 mg.             Core Components/Risk Factors/Patient Goals Review:   Goals and Risk Factor Review     Row Name 01/25/22 1437 02/09/22 1443           Core Components/Risk Factors/Patient Goals Review   Personal Goals Review Weight Management/Obesity;Heart Failure;Hypertension;Lipids;Diabetes Weight Management/Obesity;Heart Failure;Hypertension;Lipids;Diabetes      Review Shannon Moore started intensive cardiac rehab on  01/25/22. Sandy did well with exercise. Vital signs stable. CBG 209 pre exercise, post exercise CBg 166. Will contibnue to monitor CBG's. Sandy started intensive cardiac rehab on 01/25/22. Shannon Moore and has been doing well with exercise. Vital signs and CBG's have been stable.      Expected Outcomes Shannon Moore will continue to participate in intensive cardiac rehab for exercise, nutrition and lifestyle modificications. Shannon Moore will continue to participate in intensive cardiac rehab for exercise, nutrition and lifestyle modificications.               Core Components/Risk Factors/Patient Goals at Discharge (Final Review):   Goals and Risk Factor Review - 02/09/22 1443       Core Components/Risk Factors/Patient Goals Review   Personal Goals Review Weight Management/Obesity;Heart Failure;Hypertension;Lipids;Diabetes    Review Shannon Moore started intensive cardiac rehab on 01/25/22. Shannon Moore and has been doing well with exercise. Vital signs and CBG's have been stable.    Expected Outcomes Shannon Moore will continue to participate in intensive cardiac rehab for exercise, nutrition and lifestyle modificications.             ITP Comments:  ITP Comments     Row Name 01/22/22 1218 01/25/22 1434 02/09/22 1442       ITP Comments Dr. Fransico Him medical director. Introduction to pritikin education program/ intensive cardiac rehab. Initital orientation packet reviewed with the patient. 30 Day ITP Review. Sandy started intensive cardiac rehab on 01/25/22 and did well with exercise 30 Day ITP Review. Shannon Moore is off to a good start to  intensive cardiac rehab  and is doing well with exercise              Comments: See ITP comments

## 2022-02-10 ENCOUNTER — Ambulatory Visit (HOSPITAL_COMMUNITY): Payer: Medicare HMO

## 2022-02-10 ENCOUNTER — Encounter (HOSPITAL_COMMUNITY): Payer: Medicare Other

## 2022-02-10 ENCOUNTER — Encounter (HOSPITAL_COMMUNITY)
Admission: RE | Admit: 2022-02-10 | Discharge: 2022-02-10 | Disposition: A | Discharge status: Home / Self Care | Payer: Medicare Other | Source: Ambulatory Visit | Attending: Cardiology | Admitting: Cardiology

## 2022-02-10 DIAGNOSIS — Z955 Presence of coronary angioplasty implant and graft: Secondary | ICD-10-CM

## 2022-02-12 ENCOUNTER — Encounter (HOSPITAL_COMMUNITY)
Admission: RE | Admit: 2022-02-12 | Discharge: 2022-02-12 | Disposition: A | Discharge status: Home / Self Care | Payer: Medicare Other | Source: Ambulatory Visit | Attending: Cardiology | Admitting: Cardiology

## 2022-02-12 ENCOUNTER — Ambulatory Visit (HOSPITAL_COMMUNITY): Payer: Medicare HMO

## 2022-02-12 ENCOUNTER — Encounter (HOSPITAL_COMMUNITY): Payer: Medicare Other

## 2022-02-12 DIAGNOSIS — E119 Type 2 diabetes mellitus without complications: Secondary | ICD-10-CM | POA: Insufficient documentation

## 2022-02-12 DIAGNOSIS — I2510 Atherosclerotic heart disease of native coronary artery without angina pectoris: Secondary | ICD-10-CM | POA: Insufficient documentation

## 2022-02-12 DIAGNOSIS — Z79899 Other long term (current) drug therapy: Secondary | ICD-10-CM | POA: Insufficient documentation

## 2022-02-12 DIAGNOSIS — Z48812 Encounter for surgical aftercare following surgery on the circulatory system: Secondary | ICD-10-CM | POA: Insufficient documentation

## 2022-02-12 DIAGNOSIS — E785 Hyperlipidemia, unspecified: Secondary | ICD-10-CM | POA: Insufficient documentation

## 2022-02-12 DIAGNOSIS — Z955 Presence of coronary angioplasty implant and graft: Secondary | ICD-10-CM | POA: Insufficient documentation

## 2022-02-12 DIAGNOSIS — I10 Essential (primary) hypertension: Secondary | ICD-10-CM | POA: Diagnosis present

## 2022-02-15 ENCOUNTER — Ambulatory Visit (HOSPITAL_COMMUNITY): Payer: Medicare HMO

## 2022-02-15 ENCOUNTER — Encounter (HOSPITAL_COMMUNITY): Payer: Medicare Other

## 2022-02-15 ENCOUNTER — Encounter (HOSPITAL_COMMUNITY)
Admission: RE | Admit: 2022-02-15 | Discharge: 2022-02-15 | Disposition: A | Discharge status: Home / Self Care | Payer: Medicare Other | Source: Ambulatory Visit | Attending: Cardiology | Admitting: Cardiology

## 2022-02-15 DIAGNOSIS — I2510 Atherosclerotic heart disease of native coronary artery without angina pectoris: Secondary | ICD-10-CM | POA: Diagnosis not present

## 2022-02-15 DIAGNOSIS — Z955 Presence of coronary angioplasty implant and graft: Secondary | ICD-10-CM

## 2022-02-15 NOTE — Progress Notes (Signed)
Reviewed home exercise guidelines with patient including endpoints, temperature precautions, target heart rate and rate of perceived exertion. Patient plans to walk as her mode of home exercise. Patient will start walking 10-15 minutes, 1-2 times/ day, 1 day/week in addition to exercise at cardiac rehab. Patient will increase duration and frequency as tolerated with a goal of achieving 150 minutes aerobic exercise/week. Patient is stretching at home and has the provided exercise band that she can use for her resistance training. Patient voices understanding of instructions given.  Sol Passer, MS, ACSM CEP

## 2022-02-17 ENCOUNTER — Encounter (HOSPITAL_COMMUNITY)
Admission: RE | Admit: 2022-02-17 | Discharge: 2022-02-17 | Disposition: A | Discharge status: Home / Self Care | Payer: Medicare Other | Source: Ambulatory Visit | Attending: Cardiology | Admitting: Cardiology

## 2022-02-17 ENCOUNTER — Encounter (HOSPITAL_COMMUNITY): Payer: Medicare Other

## 2022-02-17 ENCOUNTER — Ambulatory Visit (HOSPITAL_COMMUNITY): Payer: Medicare HMO

## 2022-02-17 DIAGNOSIS — Z955 Presence of coronary angioplasty implant and graft: Secondary | ICD-10-CM

## 2022-02-17 DIAGNOSIS — I2510 Atherosclerotic heart disease of native coronary artery without angina pectoris: Secondary | ICD-10-CM | POA: Diagnosis not present

## 2022-02-19 ENCOUNTER — Encounter (HOSPITAL_COMMUNITY)
Admission: RE | Admit: 2022-02-19 | Discharge: 2022-02-19 | Disposition: A | Discharge status: Home / Self Care | Payer: Medicare Other | Source: Ambulatory Visit | Attending: Cardiology | Admitting: Cardiology

## 2022-02-19 ENCOUNTER — Ambulatory Visit (HOSPITAL_COMMUNITY): Payer: Medicare HMO

## 2022-02-19 ENCOUNTER — Encounter (HOSPITAL_COMMUNITY): Payer: Medicare Other

## 2022-02-19 DIAGNOSIS — I2510 Atherosclerotic heart disease of native coronary artery without angina pectoris: Secondary | ICD-10-CM | POA: Diagnosis not present

## 2022-02-19 DIAGNOSIS — Z955 Presence of coronary angioplasty implant and graft: Secondary | ICD-10-CM

## 2022-02-22 ENCOUNTER — Ambulatory Visit (HOSPITAL_COMMUNITY): Payer: Medicare HMO

## 2022-02-22 ENCOUNTER — Encounter (HOSPITAL_COMMUNITY): Payer: Medicare Other

## 2022-02-22 ENCOUNTER — Encounter (HOSPITAL_COMMUNITY)
Admission: RE | Admit: 2022-02-22 | Discharge: 2022-02-22 | Disposition: A | Discharge status: Home / Self Care | Payer: Medicare Other | Source: Ambulatory Visit | Attending: Cardiology | Admitting: Cardiology

## 2022-02-22 DIAGNOSIS — Z955 Presence of coronary angioplasty implant and graft: Secondary | ICD-10-CM

## 2022-02-22 DIAGNOSIS — I2510 Atherosclerotic heart disease of native coronary artery without angina pectoris: Secondary | ICD-10-CM | POA: Diagnosis not present

## 2022-02-24 ENCOUNTER — Encounter (HOSPITAL_COMMUNITY): Payer: Medicare Other

## 2022-02-24 ENCOUNTER — Encounter (HOSPITAL_COMMUNITY)
Admission: RE | Admit: 2022-02-24 | Discharge: 2022-02-24 | Disposition: A | Discharge status: Home / Self Care | Payer: Medicare Other | Source: Ambulatory Visit | Attending: Cardiology | Admitting: Cardiology

## 2022-02-24 ENCOUNTER — Ambulatory Visit (HOSPITAL_COMMUNITY): Payer: Medicare HMO

## 2022-02-24 DIAGNOSIS — I2510 Atherosclerotic heart disease of native coronary artery without angina pectoris: Secondary | ICD-10-CM | POA: Diagnosis not present

## 2022-02-24 DIAGNOSIS — Z955 Presence of coronary angioplasty implant and graft: Secondary | ICD-10-CM

## 2022-02-26 ENCOUNTER — Ambulatory Visit (HOSPITAL_COMMUNITY): Payer: Medicare HMO

## 2022-02-26 ENCOUNTER — Encounter (HOSPITAL_COMMUNITY): Payer: Medicare Other

## 2022-02-26 ENCOUNTER — Encounter (HOSPITAL_COMMUNITY)
Admission: RE | Admit: 2022-02-26 | Discharge: 2022-02-26 | Disposition: A | Discharge status: Home / Self Care | Payer: Medicare Other | Source: Ambulatory Visit | Attending: Cardiology | Admitting: Cardiology

## 2022-02-26 DIAGNOSIS — I2510 Atherosclerotic heart disease of native coronary artery without angina pectoris: Secondary | ICD-10-CM | POA: Diagnosis not present

## 2022-02-26 DIAGNOSIS — Z955 Presence of coronary angioplasty implant and graft: Secondary | ICD-10-CM

## 2022-03-01 ENCOUNTER — Encounter (HOSPITAL_COMMUNITY): Payer: Medicare Other

## 2022-03-01 ENCOUNTER — Ambulatory Visit (HOSPITAL_COMMUNITY): Payer: Medicare HMO

## 2022-03-01 ENCOUNTER — Encounter (HOSPITAL_COMMUNITY)
Admission: RE | Admit: 2022-03-01 | Discharge: 2022-03-01 | Disposition: A | Discharge status: Home / Self Care | Payer: Medicare Other | Source: Ambulatory Visit | Attending: Cardiology | Admitting: Cardiology

## 2022-03-01 DIAGNOSIS — I2510 Atherosclerotic heart disease of native coronary artery without angina pectoris: Secondary | ICD-10-CM | POA: Diagnosis not present

## 2022-03-01 DIAGNOSIS — Z955 Presence of coronary angioplasty implant and graft: Secondary | ICD-10-CM

## 2022-03-02 DIAGNOSIS — E785 Hyperlipidemia, unspecified: Secondary | ICD-10-CM

## 2022-03-02 DIAGNOSIS — I10 Essential (primary) hypertension: Secondary | ICD-10-CM

## 2022-03-02 DIAGNOSIS — E119 Type 2 diabetes mellitus without complications: Secondary | ICD-10-CM

## 2022-03-02 DIAGNOSIS — Z79899 Other long term (current) drug therapy: Secondary | ICD-10-CM

## 2022-03-02 DIAGNOSIS — I2510 Atherosclerotic heart disease of native coronary artery without angina pectoris: Secondary | ICD-10-CM

## 2022-03-02 LAB — CBC

## 2022-03-03 ENCOUNTER — Encounter (HOSPITAL_COMMUNITY)
Admission: RE | Admit: 2022-03-03 | Discharge: 2022-03-03 | Disposition: A | Discharge status: Home / Self Care | Payer: Medicare Other | Source: Ambulatory Visit | Attending: Cardiology | Admitting: Cardiology

## 2022-03-03 ENCOUNTER — Encounter (HOSPITAL_COMMUNITY): Payer: Medicare Other

## 2022-03-03 ENCOUNTER — Ambulatory Visit (HOSPITAL_COMMUNITY): Payer: Medicare HMO

## 2022-03-03 DIAGNOSIS — Z955 Presence of coronary angioplasty implant and graft: Secondary | ICD-10-CM

## 2022-03-03 DIAGNOSIS — I2510 Atherosclerotic heart disease of native coronary artery without angina pectoris: Secondary | ICD-10-CM | POA: Diagnosis not present

## 2022-03-03 LAB — LIPID PANEL
Chol/HDL Ratio: 2.3 ratio (ref 0.0–4.4)
Cholesterol, Total: 113 mg/dL (ref 100–199)
HDL: 50 mg/dL (ref >39)
LDL Chol Calc (NIH): 32 mg/dL (ref 0–99)
Triglycerides: 196 mg/dL — ABNORMAL HIGH (ref 0–149)
VLDL Cholesterol Cal: 31 mg/dL (ref 5–40)

## 2022-03-03 LAB — COMPREHENSIVE METABOLIC PANEL
ALT: 16 IU/L (ref 0–32)
AST: 18 IU/L (ref 0–40)
Albumin/Globulin Ratio: 1.7 (ref 1.2–2.2)
Albumin: 4.3 g/dL (ref 3.8–4.8)
Alkaline Phosphatase: 70 IU/L (ref 44–121)
BUN/Creatinine Ratio: 20 (ref 12–28)
BUN: 17 mg/dL (ref 8–27)
Bilirubin Total: 0.3 mg/dL (ref 0.0–1.2)
CO2: 27 mmol/L (ref 20–29)
Calcium: 9.5 mg/dL (ref 8.7–10.3)
Chloride: 100 mmol/L (ref 96–106)
Creatinine, Ser: 0.83 mg/dL (ref 0.57–1.00)
Globulin, Total: 2.6 g/dL (ref 1.5–4.5)
Glucose: 164 mg/dL — ABNORMAL HIGH (ref 70–99)
Potassium: 3.8 mmol/L (ref 3.5–5.2)
Sodium: 142 mmol/L (ref 134–144)
Total Protein: 6.9 g/dL (ref 6.0–8.5)
eGFR: 73 mL/min/{1.73_m2} (ref >59)

## 2022-03-03 LAB — CBC
Hematocrit: 37.4 % (ref 34.0–46.6)
Hemoglobin: 12.3 g/dL (ref 11.1–15.9)
MCH: 30.4 pg (ref 26.6–33.0)
MCHC: 32.9 g/dL (ref 31.5–35.7)
MCV: 92 fL (ref 79–97)
Platelets: 320 10*3/uL (ref 150–450)
RBC: 4.05 x10E6/uL (ref 3.77–5.28)
RDW: 13.7 % (ref 11.7–15.4)
WBC: 9.2 10*3/uL (ref 3.4–10.8)

## 2022-03-03 LAB — HEMOGLOBIN A1C
Est. average glucose Bld gHb Est-mCnc: 154 mg/dL
Hgb A1c MFr Bld: 7 % — ABNORMAL HIGH (ref 4.8–5.6)

## 2022-03-05 ENCOUNTER — Ambulatory Visit (HOSPITAL_COMMUNITY): Payer: Medicare HMO

## 2022-03-05 ENCOUNTER — Encounter (HOSPITAL_COMMUNITY)
Admission: RE | Admit: 2022-03-05 | Discharge: 2022-03-05 | Disposition: A | Discharge status: Home / Self Care | Payer: Medicare Other | Source: Ambulatory Visit | Attending: Cardiology | Admitting: Cardiology

## 2022-03-05 ENCOUNTER — Encounter (HOSPITAL_COMMUNITY): Payer: Medicare Other

## 2022-03-05 DIAGNOSIS — I2510 Atherosclerotic heart disease of native coronary artery without angina pectoris: Secondary | ICD-10-CM | POA: Diagnosis not present

## 2022-03-05 DIAGNOSIS — Z955 Presence of coronary angioplasty implant and graft: Secondary | ICD-10-CM

## 2022-03-05 NOTE — Progress Notes (Signed)
Cardiac Individual Treatment Plan  Patient Details  Name: Shannon Moore MRN: [id number] Date of Birth: 09/06/1945 Referring Provider:   [place] from 01/22/2022 in [facility] for Heart, Vascular, & [place]  Referring Provider Gwyndolyn Kaufman, MD       Initial Encounter Date:  [place] from 01/22/2022 in [facility] for Heart, Vascular, & [facility]  Date 01/22/22       Visit Diagnosis: Status post coronary artery stent placement  Patient's Home Medications on Admission:  Current Outpatient Medications:    allopurinol (ZYLOPRIM) 300 MG tablet, Take 300 mg by mouth daily with supper., Disp: , Rfl:    aspirin EC 81 MG tablet, 81 mg daily., Disp: , Rfl:    b complex vitamins capsule, Take 1 capsule by mouth daily., Disp: , Rfl:    Biotin 10000 MCG TABS, Take 10,000 mcg by mouth daily., Disp: 30 tablet, Rfl:    cetirizine (ZYRTEC) 10 MG tablet, Take 10 mg by mouth daily., Disp: , Rfl:    Cholecalciferol (VITAMIN D3) 50 MCG (2000 UT) TABS, Take 2,000 Units by mouth daily with supper., Disp: , Rfl:    estradiol (ESTRACE) 0.1 MG/GM vaginal cream, Place 1 Applicatorful vaginally as needed., Disp: , Rfl:    fluticasone (FLONASE) 50 MCG/ACT nasal spray, Place 2 sprays into both nostrils daily as needed for allergies or rhinitis., Disp: , Rfl:    gabapentin (NEURONTIN) 300 MG capsule, Take 300-600 mg by mouth at bedtime., Disp: , Rfl:    Glucosamine-Chondroitin 500-400 MG CAPS, Take 1 capsule by mouth in the morning and at bedtime., Disp: , Rfl:    magnesium gluconate (MAGONATE) 500 MG tablet, Take 500 mg by mouth daily with supper., Disp: , Rfl:    metFORMIN (GLUCOPHAGE-XR) 500 MG 24 hr tablet, Take 500 mg by mouth 2 (two) times daily with a meal., Disp: , Rfl:    metoprolol succinate (TOPROL-XL) 25 MG 24 hr tablet, Take 25 mg by mouth daily with supper., Disp: , Rfl:     Multiple Vitamins-Minerals (MULTIVITAMIN WITH MINERALS) tablet, Take 1 tablet by mouth daily., Disp: , Rfl:    nitroGLYCERIN (NITROSTAT) 0.4 MG SL tablet, Place 1 tablet (0.4 mg total) under the tongue every 5 (five) minutes as needed for chest pain., Disp: 25 tablet, Rfl: 3   olmesartan (BENICAR) 20 MG tablet, Take 0.5 tablets (10 mg total) by mouth daily., Disp: 45 tablet, Rfl: 3   pantoprazole (PROTONIX) 40 MG tablet, Take 40 mg by mouth as needed (for indigestion)., Disp: , Rfl:    Probiotic Product (PROBIOTIC PO), Take 1 tablet by mouth daily., Disp: , Rfl:    rosuvastatin (CRESTOR) 20 MG tablet, Take 1 tablet (20 mg total) by mouth daily., Disp: 90 tablet, Rfl: 3   TACROLIMUS EX, Apply 1 application. topically daily as needed (Psoriasis). Ointment, Disp: , Rfl:    ticagrelor (BRILINTA) 90 MG TABS tablet, Take 1 tablet (90 mg total) by mouth 2 (two) times daily., Disp: 180 tablet, Rfl: 3   tiZANidine (ZANAFLEX) 2 MG tablet, Take 2 mg by mouth 3 (three) times daily as needed for muscle spasms., Disp: , Rfl:   Past Medical History: Past Medical History:  Diagnosis Date   Abnormal mammogram    Adenomatous polyp of colon    Anginal pain (HCC)    Arthritis    Hands, Knees hips, feet,    Bell's palsy  CAD (coronary artery disease)    a. s/p DES to prox-RCA in 06/2021 following abnormal Coronary CT   CHF (congestive heart failure) ([place]) 1990s   pt feels it is from Zoloft or a virus   Diabetic peripheral neuropathy ([place])    Diastolic dysfunction    Diverticulitis    DM (diabetes mellitus) ([place])    Dysphagia    Esophageal obstruction    Essential hypertension 01/08/2014   Female cystocele    GERD (gastroesophageal reflux disease)    Goiter    Gout    History of 2019 novel coronavirus disease (COVID-19)    HTN (hypertension)    Hyperlipidemia 01/08/2014   Migraines    Mixed incontinence urge and stress    Obesity (BMI 30-39.9) 01/08/2014   Osteoarthritis of knee    Thyroid  nodule    Type 2 diabetes mellitus without complication ([place]) 123456    Tobacco Use: Social History   Tobacco Use  Smoking Status Former   Packs/day: 1.00   Years: 8.00   Total pack years: 8.00   Types: Cigarettes   Quit date: 11/10/1977   Years since quitting: 44.3  Smokeless Tobacco Never    Labs: Review Flowsheet       Latest Ref Rng & Units 07/31/2019 06/12/2021 06/29/2021 03/02/2022  Labs for ITP Cardiac and Pulmonary Rehab  Cholestrol 100 - 199 mg/dL - - 120  113   LDL (calc) 0 - 99 mg/dL - - 45  32   HDL-C >39 mg/dL - - 49  50   Trlycerides 0 - 149 mg/dL - - 155  196   Hemoglobin A1c 4.8 - 5.6 % 6.9  7.0  - 7.0     Capillary Blood Glucose: Lab Results  Component Value Date   GLUCAP 116 (H) 01/29/2022   GLUCAP 123 (H) 01/27/2022   GLUCAP 179 (H) 01/27/2022   GLUCAP 166 (H) 01/25/2022   GLUCAP 209 (H) 01/25/2022     Exercise Target Goals: Exercise Program Goal: Individual exercise prescription set using results from initial 6 min walk test and THRR while considering  patient's activity barriers and safety.   Exercise Prescription Goal: Initial exercise prescription builds to 30-45 minutes a day of aerobic activity, 2-3 days per week.  Home exercise guidelines will be given to patient during program as part of exercise prescription that the participant will acknowledge.  Activity Barriers & Risk Stratification:  Activity Barriers & Cardiac Risk Stratification - 01/22/22 1223       Activity Barriers & Cardiac Risk Stratification   Activity Barriers Arthritis;Right Hip Replacement;Left Knee Replacement;Neck/Spine Problems;Joint Problems;Deconditioning;Balance Concerns;History of Falls    Cardiac Risk Stratification High   <5 METS 6MWT            6 Minute Walk:  6 Minute Walk     Row Name 01/22/22 1222         6 Minute Walk   Phase Initial     Distance 1080 feet     Walk Time 6 minutes     # of Rest Breaks 0     MPH 2.05     METS 2.28      RPE 9     Perceived Dyspnea  1  towards end of test     VO2 Peak 7.98     Symptoms Yes (comment)     Comments SOB at end of test     Resting HR 63 bpm     Resting BP 138/66  Resting Oxygen Saturation  97 %     Exercise Oxygen Saturation  during 6 min walk 97 %     Max Ex. HR 116 bpm     Max Ex. BP 154/60     2 Minute Post BP 140/70              Oxygen Initial Assessment:   Oxygen Re-Evaluation:   Oxygen Discharge (Final Oxygen Re-Evaluation):   Initial Exercise Prescription:  Initial Exercise Prescription - 01/22/22 1200       Date of Initial Exercise RX and Referring Provider   Date 01/22/22    Referring Provider Gwyndolyn Kaufman, MD    Expected Discharge Date 03/19/22      Arm Ergometer   Level 1    RPM 50    Minutes 15    METs 2      T5 Nustep   Level 1    SPM 75    Minutes 15    METs 2      Prescription Details   Frequency (times per week) 3    Duration Progress to 30 minutes of continuous aerobic without signs/symptoms of physical distress      Intensity   THRR 40-80% of Max Heartrate 58-115    Ratings of Perceived Exertion 11-13    Perceived Dyspnea 0-4      Progression   Progression Continue to progress workloads to maintain intensity without signs/symptoms of physical distress.      Resistance Training   Training Prescription Yes    Weight 2    Reps 10-15             Perform Capillary Blood Glucose checks as needed.  Exercise Prescription Changes:   Exercise Prescription Changes     Row Name 01/25/22 1027 02/05/22 1019 02/26/22 1022         Response to Exercise   Blood Pressure (Admit) 130/60 130/56 140/80     Blood Pressure (Exercise) 156/80 142/80 160/80     Blood Pressure (Exit) 108/70 128/72 124/72     Heart Rate (Admit) 64 bpm 73 bpm 72 bpm     Heart Rate (Exercise) 92 bpm 110 bpm 117 bpm     Heart Rate (Exit) 68 bpm 82 bpm 81 bpm     Rating of Perceived Exertion (Exercise) '10 11 12     '$ Symptoms None None None      Comments Off to a good start with exercise. -- --     Duration Continue with 30 min of aerobic exercise without signs/symptoms of physical distress. Continue with 30 min of aerobic exercise without signs/symptoms of physical distress. Continue with 30 min of aerobic exercise without signs/symptoms of physical distress.     Intensity THRR unchanged THRR unchanged THRR unchanged       Progression   Progression Continue to progress workloads to maintain intensity without signs/symptoms of physical distress. Continue to progress workloads to maintain intensity without signs/symptoms of physical distress. Continue to progress workloads to maintain intensity without signs/symptoms of physical distress.     Average METs 2.3 2.6 3       Resistance Training   Training Prescription Yes Yes Yes     Weight '2 2 2     '$ Reps 10-15 10-15 10-15     Time 10 Minutes 10 Minutes 10 Minutes       Interval Training   Interval Training No No No       NuStep   Level 1 1  2     SPM 81 99 106     Minutes '15 15 15     '$ METs 2.3 3 3.3       Arm Ergometer   Level '1 1 2     '$ Watts 1 -- 18     RPM 39 -- 58     Minutes '15 15 15     '$ METs -- 2.2 2.8       Home Exercise Plan   Plans to continue exercise at -- -- Home (comment)  Walking     Frequency -- -- Add 1 additional day to program exercise sessions.     Initial Home Exercises Provided -- -- 02/15/22              Exercise Comments:   Exercise Comments     Row Name 01/25/22 1129 02/08/22 1118 02/15/22 1054 02/26/22 1142     Exercise Comments Patient tolerated 1st session of exericse well rating low intensity exercise very light. Oriented to warm-up and cool-down stretches with a chair in front for balance as needed. Reviewed METs and goals with patient. Stretching handout given. Reviewed home exercise guidelines and goals with patient. Increased WL on arm ergometer today and tolerated well. Reviewed METs and goals with patient.              Exercise Goals and Review:   Exercise Goals     Row Name 01/22/22 1225             Exercise Goals   Increase Physical Activity Yes       Intervention Provide advice, education, support and counseling about physical activity/exercise needs.;Develop an individualized exercise prescription for aerobic and resistive training based on initial evaluation findings, risk stratification, comorbidities and participant's personal goals.       Expected Outcomes Short Term: Attend rehab on a regular basis to increase amount of physical activity.;Long Term: Exercising regularly at least 3-5 days a week.;Long Term: Add in home exercise to make exercise part of routine and to increase amount of physical activity.       Increase Strength and Stamina Yes       Intervention Provide advice, education, support and counseling about physical activity/exercise needs.;Develop an individualized exercise prescription for aerobic and resistive training based on initial evaluation findings, risk stratification, comorbidities and participant's personal goals.       Expected Outcomes Short Term: Increase workloads from initial exercise prescription for resistance, speed, and METs.;Short Term: Perform resistance training exercises routinely during rehab and add in resistance training at home;Long Term: Improve cardiorespiratory fitness, muscular endurance and strength as measured by increased METs and functional capacity (6MWT)       Able to understand and use rate of perceived exertion (RPE) scale Yes       Intervention Provide education and explanation on how to use RPE scale       Expected Outcomes Short Term: Able to use RPE daily in rehab to express subjective intensity level;Long Term:  Able to use RPE to guide intensity level when exercising independently       Knowledge and understanding of Target Heart Rate Range (THRR) Yes       Intervention Provide education and explanation of THRR including how the numbers  were predicted and where they are located for reference       Expected Outcomes Short Term: Able to state/look up THRR;Long Term: Able to use THRR to govern intensity when exercising independently;Short Term: Able to use daily  as guideline for intensity in rehab       Understanding of Exercise Prescription Yes       Intervention Provide education, explanation, and written materials on patient's individual exercise prescription       Expected Outcomes Short Term: Able to explain program exercise prescription;Long Term: Able to explain home exercise prescription to exercise independently                Exercise Goals Re-Evaluation :  Exercise Goals Re-Evaluation     Row Name 01/25/22 1129 02/08/22 1118 02/15/22 1054 02/26/22 1142       Exercise Goal Re-Evaluation   Exercise Goals Review Increase Physical Activity;Able to understand and use rate of perceived exertion (RPE) scale;Increase Strength and Stamina Increase Physical Activity;Able to understand and use rate of perceived exertion (RPE) scale;Increase Strength and Stamina Increase Physical Activity;Able to understand and use rate of perceived exertion (RPE) scale;Increase Strength and Stamina;Knowledge and understanding of Target Heart Rate Range (THRR);Understanding of Exercise Prescription Increase Physical Activity;Able to understand and use rate of perceived exertion (RPE) scale;Increase Strength and Stamina;Knowledge and understanding of Target Heart Rate Range (THRR);Understanding of Exercise Prescription    Comments Patient able to understand and use RPE scale appropriately. Patient is not currently exercising at home. She can walk outside weather permitting. Patient's goal is to get in better shape. Wants to increase workloads on both machines. Reviewed exercise prescription with patient. Patient will walk 10-15 minutes 1-2 x's/day, 1 day a week increasing frequency and duration as tolerated. Patient states she's had huge improvement  in her flexibility, which is one of her goals. She also states she has more energy, she's lost weight, her blood sugar is better, and overall she feels a lot beter.    Expected Outcomes Progress workloads as tolerated to help increase strength and stamina and achieve personal health and fitness goals. Increase workloads on Nustep and Arm ergometer. Add walking at home to achieve 150 minutes aerobic exercise/week. Patient will add a day of walking at home to her exercise routine. Patient will progress to 150 minutes of aerobic exercise /week. Continue to progress workloads to help achieve personal health and fitness goals.             Discharge Exercise Prescription (Final Exercise Prescription Changes):  Exercise Prescription Changes - 02/26/22 1022       Response to Exercise   Blood Pressure (Admit) 140/80    Blood Pressure (Exercise) 160/80    Blood Pressure (Exit) 124/72    Heart Rate (Admit) 72 bpm    Heart Rate (Exercise) 117 bpm    Heart Rate (Exit) 81 bpm    Rating of Perceived Exertion (Exercise) 12    Symptoms None    Duration Continue with 30 min of aerobic exercise without signs/symptoms of physical distress.    Intensity THRR unchanged      Progression   Progression Continue to progress workloads to maintain intensity without signs/symptoms of physical distress.    Average METs 3      Resistance Training   Training Prescription Yes    Weight 2    Reps 10-15    Time 10 Minutes      Interval Training   Interval Training No      NuStep   Level 2    SPM 106    Minutes 15    METs 3.3      Arm Ergometer   Level 2    Watts 18    RPM  58    Minutes 15    METs 2.8      Home Exercise Plan   Plans to continue exercise at Home (comment)   Walking   Frequency Add 1 additional day to program exercise sessions.    Initial Home Exercises Provided 02/15/22             Nutrition:  Target Goals: Understanding of nutrition guidelines, daily intake of sodium  '1500mg'$ , cholesterol '200mg'$ , calories 30% from fat and 7% or less from saturated fats, daily to have 5 or more servings of fruits and vegetables.  Biometrics:  Pre Biometrics - 01/22/22 1217       Pre Biometrics   Waist Circumference 41.25 inches    Hip Circumference 46.25 inches    Waist to Hip Ratio 0.89 %    Triceps Skinfold 44 mm    % Body Fat 46.3 %    Grip Strength 18 kg    Flexibility 10.5 in    Single Leg Stand 14.31 seconds              Nutrition Therapy Plan and Nutrition Goals:  Nutrition Therapy & Goals - 02/24/22 0836       Nutrition Therapy   Diet Heart Healthy/Carbohydrate Consistent    Drug/Food Interactions Statins/Certain Fruits      Personal Nutrition Goals   Nutrition Goal Patient to identify strategies for improving cardiovascualr risk by attending the [employer] education and nutrition series weekly    Personal Goal #2 Patient to improve diet quality by using the plate method as a daily guide for meal planning to include lean protein/plant protein, fruits, vegetables, whole grains, and nonfat dairy as part of a balanced diet    Personal Goal #3 Patient to identify strategies for managing blood sugar and A1c <7%    Personal Goal #4 Patient to reduce sodium intake to '1500mg'$  per day    Comments Goals in action. Shannon Moore continues to attend the [employer] and nutrition series regularly. She continues to work on reading food labels for sodium and sugar, reducing saturated fat, and increasing fiber. She reports slowly implementing dietary changes with her family. Shannon Moore will continue to benefit from participation in intensive cardiac rehab for nutrition, exercise, and lifestyle modification.      Intervention Plan   Intervention Prescribe, educate and counsel regarding individualized specific dietary modifications aiming towards targeted core components such as weight, hypertension, lipid management, diabetes, heart failure and other comorbidities.;Nutrition  handout(s) given to patient.    Expected Outcomes Short Term Goal: Understand basic principles of dietary content, such as calories, fat, sodium, cholesterol and nutrients.;Long Term Goal: Adherence to prescribed nutrition plan.             Nutrition Assessments:  Nutrition Assessments - 01/25/22 1333       Rate Your Plate Scores   Pre Score 58            MEDIFICTS Score Key: ?70 Need to make dietary changes  40-70 Heart Healthy Diet ? 40 Therapeutic Level Cholesterol Diet   Flowsheet Row INTENSIVE CARDIAC REHAB from 01/25/2022 in [facility] for Heart, Vascular, & Lung Health  Picture Your Plate Total Score on Admission 58      Picture Your Plate Scores: [id number] Unhealthy dietary pattern with much room for improvement. 41-50 Dietary pattern unlikely to meet recommendations for good health and room for improvement. 51-60 More healthful dietary pattern, with some room for improvement.  >60 Healthy dietary pattern, although  there may be some specific behaviors that could be improved.    Nutrition Goals Re-Evaluation:  Nutrition Goals Re-Evaluation     [place] Name 01/25/22 1108 02/24/22 0836           Goals   Current Weight 171 lb 15.3 oz (78 kg) 169 lb 1.5 oz (76.7 kg)      Comment Triglycerides 155, A1c 7.0 No new labs; most recent labs scanned in from [place] show triglycerides 201, GFR 54, A1c 6.9      Expected Outcome Shannon Moore will benefit from participation in intensive cardiac rehab for nutrition education, exercise, and lifestyle modification. Goals in action. Shannon Moore continues to attend the [employer] and nutrition series regularly. She continues to work on reading food labels for sodium and sugar, reducing saturated fat, and increasing fiber. She has maintained her weight since starting with our program. She reports slowly implementing dietary changes with her family. Shannon Moore will continue to benefit from participation in intensive cardiac rehab  for nutrition, exercise, and lifestyle modification.               Nutrition Goals Re-Evaluation:  Nutrition Goals Re-Evaluation     [place] Name 01/25/22 1108 02/24/22 0836           Goals   Current Weight 171 lb 15.3 oz (78 kg) 169 lb 1.5 oz (76.7 kg)      Comment Triglycerides 155, A1c 7.0 No new labs; most recent labs scanned in from [place] show triglycerides 201, GFR 54, A1c 6.9      Expected Outcome Shannon Moore will benefit from participation in intensive cardiac rehab for nutrition education, exercise, and lifestyle modification. Goals in action. Shannon Moore continues to attend the [employer] and nutrition series regularly. She continues to work on reading food labels for sodium and sugar, reducing saturated fat, and increasing fiber. She has maintained her weight since starting with our program. She reports slowly implementing dietary changes with her family. Shannon Moore will continue to benefit from participation in intensive cardiac rehab for nutrition, exercise, and lifestyle modification.               Nutrition Goals Discharge (Final Nutrition Goals Re-Evaluation):  Nutrition Goals Re-Evaluation - 02/24/22 0836       Goals   Current Weight 169 lb 1.5 oz (76.7 kg)    Comment No new labs; most recent labs scanned in from [place] show triglycerides 201, GFR 54, A1c 6.9    Expected Outcome Goals in action. Shannon Moore continues to attend the [employer] and nutrition series regularly. She continues to work on reading food labels for sodium and sugar, reducing saturated fat, and increasing fiber. She has maintained her weight since starting with our program. She reports slowly implementing dietary changes with her family. Shannon Moore will continue to benefit from participation in intensive cardiac rehab for nutrition, exercise, and lifestyle modification.             Psychosocial: Target Goals: Acknowledge presence or absence of significant depression and/or stress, maximize coping  skills, provide positive support system. Participant is able to verbalize types and ability to use techniques and skills needed for reducing stress and depression.  Initial Review & Psychosocial Screening:  Initial Psych Review & Screening - 01/22/22 1226       Initial Review   Current issues with None Identified      Family Dynamics   Good Support System? Yes   Shannon Moore has her husband and children who live nearby for support  Barriers   Psychosocial barriers to participate in program There are no identifiable barriers or psychosocial needs.;The patient should benefit from training in stress management and relaxation.      Screening Interventions   Interventions Encouraged to exercise;To provide support and resources with identified psychosocial needs;Provide feedback about the scores to participant    Expected Outcomes Short Term goal: Identification and review with participant of any Quality of Life or Depression concerns found by scoring the questionnaire.;Long Term goal: The participant improves quality of Life and PHQ9 Scores as seen by post scores and/or verbalization of changes             Quality of Life Scores:  Quality of Life - 01/22/22 1227       Quality of Life   Select Quality of Life      Quality of Life Scores   Health/Function Pre 29.2 %    Socioeconomic Pre 30 %    Psych/Spiritual Pre 27.43 %    Family Pre 28.8 %    GLOBAL Pre 28.97 %            Scores of 19 and below usually indicate a poorer quality of life in these areas.  A difference of  2-3 points is a clinically meaningful difference.  A difference of 2-3 points in the total score of the Quality of Life Index has been associated with significant improvement in overall quality of life, self-image, physical symptoms, and general health in studies assessing change in quality of life.  PHQ-9: Review Flowsheet       01/22/2022 06/10/2016  Depression screen PHQ 2/9  Decreased Interest 0 0  Down,  Depressed, Hopeless 0 0  PHQ - 2 Score 0 0  Altered sleeping 1 -  Tired, decreased energy 0 -  Change in appetite 1 -  Feeling bad or failure about yourself  0 -  Trouble concentrating 0 -  Moving slowly or fidgety/restless 0 -  Suicidal thoughts 0 -  PHQ-9 Score 2 -  Difficult doing work/chores Not difficult at all -   Interpretation of Total Score  Total Score Depression Severity:  1-4 = Minimal depression, 5-9 = Mild depression, 10-14 = Moderate depression, 15-19 = Moderately severe depression, 20-27 = Severe depression   Psychosocial Evaluation and Intervention:   Psychosocial Re-Evaluation:  Psychosocial Re-Evaluation     [place] Name 01/25/22 1436 03/05/22 0849           Psychosocial Re-Evaluation   Current issues with None Identified None Identified      Interventions Encouraged to attend Cardiac Rehabilitation for the exercise Encouraged to attend Cardiac Rehabilitation for the exercise      Continue Psychosocial Services  No Follow up required No Follow up required               Psychosocial Discharge (Final Psychosocial Re-Evaluation):  Psychosocial Re-Evaluation - 03/05/22 0849       Psychosocial Re-Evaluation   Current issues with None Identified    Interventions Encouraged to attend Cardiac Rehabilitation for the exercise    Continue Psychosocial Services  No Follow up required             Vocational Rehabilitation: Provide vocational rehab assistance to qualifying candidates.   Vocational Rehab Evaluation & Intervention:  Vocational Rehab - 01/22/22 1229       Initial Vocational Rehab Evaluation & Intervention   Assessment shows need for Vocational Rehabilitation No   [place] is retired and does not need vocational rehab at  this time            Education: Education Goals: Education classes will be provided on a weekly basis, covering required topics. Participant will state understanding/return demonstration of topics presented.     Education     Row Name 01/25/22 1200     Education   Cardiac Education Topics Pritikin   IT sales professional Nutrition   Nutrition Workshop Label Reading   Instruction Review Code 1- Verbalizes Understanding   Class Start Time 1145   Class Stop Time 1230   Class Time Calculation (min) 45 min    [place] Name 01/27/22 1300     Education   Cardiac Education Topics [employer]   Educator Dietitian   Weekly Topic [place]   Instruction Review Code 1- Verbalizes Understanding   Class Start Time 1138   Class Stop Time 1225   Class Time Calculation (min) 47 min    [place] Name 01/29/22 1200     Education   Cardiac Education Topics Pritikin   Lexicographer Nutrition   Nutrition Other  Label Reading   Instruction Review Code 1- Verbalizes Understanding   Class Start Time 1140   Class Stop Time 1225   Class Time Calculation (min) 45 min    [place] Name 02/01/22 [place]   [employer]     Workshops   Educator Exercise Physiologist   Select Psychosocial   Psychosocial Workshop Other  Focused goals and sustainable changes   Instruction Review Code 1- Verbalizes Understanding   Class Start Time 1138   Class Stop Time 1220   Class Time Calculation (min) 42 min    [place] Name 02/03/22 1600     Education   Cardiac Education Topics [employer]   Educator Dietitian   Weekly Topic Tasty Appetizers and Snacks   Instruction Review Code 1- Verbalizes Understanding   Class Start Time 1145   Class Stop Time 1221   Class Time Calculation (min) 36 min    [place] Name 02/05/22 1300     Education   Cardiac Education Topics Pritikin   Select Core Videos     Core Videos   Educator Dietitian   Nutrition Calorie Density   Instruction Review Code 1-  Verbalizes Understanding   Class Start Time 1142   Class Stop Time 1223   Class Time Calculation (min) 41 min    [place] Name 02/08/22 1232     Education   Cardiac Education Topics Pritikin   [employer]     Workshops   Educator Exercise Physiologist   Select Exercise   Exercise Workshop Exercise Basics: Building Your Action Plan   Instruction Review Code 1- Verbalizes Understanding   Class Start Time 1148   Class Stop Time 1232   Class Time Calculation (min) 44 min    [place] Name 02/10/22 1300     Education   Cardiac Education Topics Pritikin   Financial trader   Weekly Topic Efficiency Cooking - Meals in a Snap   Instruction Review Code 1- Verbalizes Understanding   Class Start Time [id number]   Class Stop Time [id number]  Class Time Calculation (min) 39 min    [place] Name 02/12/22 1200     Education   Cardiac Education Topics Pritikin   Academic librarian Exercise Education   Exercise Education Move It!   Instruction Review Code 1- Verbalizes Understanding   Class Start Time [id number]   Class Stop Time 1221   Class Time Calculation (min) 33 min    [place] Name 02/15/22 1300     Education   Cardiac Education Topics Pritikin   Select Core Videos     Core Videos   Educator Dietitian   Select Nutrition   Nutrition Nutrition Action Plan   Instruction Review Code 1- Verbalizes Understanding   Class Start Time 1140   Class Stop Time 1220   Class Time Calculation (min) 40 min    [place] Name 02/17/22 1200     Education   Cardiac Education Topics Pritikin   Financial trader   Weekly Topic One-Pot Wonders   Instruction Review Code 1- Verbalizes Understanding   Class Start Time 1145   Class Stop Time 1225   Class Time Calculation (min) 40 min    [place] Name 02/19/22 1200     Education   Cardiac Education Topics Pritikin   Child psychotherapist Education   General Education Hypertension and Heart Disease   Instruction Review Code 1- Verbalizes Understanding   Class Start Time 1145   Class Stop Time 1230   Class Time Calculation (min) 45 min    [place] Name 02/22/22 1200     Education   Cardiac Education Topics Pritikin   [employer] Workshops     Workshops   Educator Exercise Physiologist   Psychosocial Workshop Healthy Sleep for a Healthy Heart   Instruction Review Code 1- Verbalizes Understanding   Class Start Time 1155   Class Stop Time 1238   Class Time Calculation (min) 43 min    [place] Name 02/24/22 1600     Education   Cardiac Education Topics Pritikin   Financial trader   Weekly Topic Comforting Weekend Breakfasts   Instruction Review Code 1- Verbalizes Understanding   Class Start Time 1155   Class Stop Time 1232   Class Time Calculation (min) 37 min    [place] Name 03/01/22 1300     Education   Cardiac Education Topics Pritikin   Academic librarian Exercise Education   Exercise Education Biomechanial Limitations   Instruction Review Code 1- Verbalizes Understanding   Class Start Time 1150   Class Stop Time 1231   Class Time Calculation (min) 41 min    [place] Name 03/03/22 1215     Education   Cardiac Education Topics Pritikin   Financial trader   Weekly Topic Fast Evening Meals   Instruction Review Code 1- Verbalizes Understanding   Class Start Time 1140   Class Stop Time 1215   Class Time Calculation (min) 35 min            Core Videos: Exercise    Move It!  Clinical staff conducted group or individual video education  with verbal and written material and guidebook.  Patient learns the recommended Pritikin exercise program. Exercise with the goal of living a long,  healthy life. Some of the health benefits of exercise include controlled diabetes, healthier blood pressure levels, improved cholesterol levels, improved heart and lung capacity, improved sleep, and better body composition. Everyone should speak with their doctor before starting or changing an exercise routine.  Biomechanical Limitations Clinical staff conducted group or individual video education with verbal and written material and guidebook.  Patient learns how biomechanical limitations can impact exercise and how we can mitigate and possibly overcome limitations to have an impactful and balanced exercise routine.  Body Composition Clinical staff conducted group or individual video education with verbal and written material and guidebook.  Patient learns that body composition (ratio of muscle mass to fat mass) is a key component to assessing overall fitness, rather than body weight alone. Increased fat mass, especially visceral belly fat, can put [place] at increased risk for metabolic syndrome, type 2 diabetes, heart disease, and even death. It is recommended to combine diet and exercise (cardiovascular and resistance training) to improve your body composition. Seek guidance from your physician and exercise physiologist before implementing an exercise routine.  Exercise Action Plan Clinical staff conducted group or individual video education with verbal and written material and guidebook.  Patient learns the recommended strategies to achieve and enjoy long-term exercise adherence, including variety, self-motivation, self-efficacy, and positive decision making. Benefits of exercise include fitness, good health, weight management, more energy, better sleep, less stress, and overall well-being.  Medical   Heart Disease Risk Reduction Clinical staff conducted group or individual video education with verbal and written material and guidebook.  Patient learns our heart is our most vital organ as it  circulates oxygen, nutrients, white blood cells, and hormones throughout the entire body, and carries waste away. Data supports a plant-based eating plan like the Pritikin Program for its effectiveness in slowing progression of and reversing heart disease. The video provides a number of recommendations to address heart disease.   Metabolic Syndrome and Belly Fat  Clinical staff conducted group or individual video education with verbal and written material and guidebook.  Patient learns what metabolic syndrome is, how it leads to heart disease, and how one can reverse it and keep it from coming back. You have metabolic syndrome if you have 3 of the following 5 criteria: abdominal obesity, high blood pressure, high triglycerides, low HDL cholesterol, and high blood sugar.  Hypertension and Heart Disease Clinical staff conducted group or individual video education with verbal and written material and guidebook.  Patient learns that high blood pressure, or hypertension, is very common in the [place]. Hypertension is largely due to excessive salt intake, but other important risk factors include being overweight, physical inactivity, drinking too much alcohol, smoking, and not eating enough potassium from fruits and vegetables. High blood pressure is a leading risk factor for heart attack, stroke, congestive heart failure, dementia, kidney failure, and premature death. Long-term effects of excessive salt intake include stiffening of the arteries and thickening of heart muscle and organ damage. Recommendations include ways to reduce hypertension and the risk of heart disease.  Diseases of Our Time - Focusing on Diabetes Clinical staff conducted group or individual video education with verbal and written material and guidebook.  Patient learns why the best way to stop diseases of our time is prevention, through food and other lifestyle changes. Medicine (such as prescription pills and surgeries) is often  only a Band-Aid on the problem, not a long-term solution. Most common diseases of our time include obesity, type 2 diabetes, hypertension, heart disease, and cancer. The Pritikin Program is recommended and has been proven to help reduce, reverse, and/or prevent the damaging effects of metabolic syndrome.  Nutrition   Overview of the Pritikin Eating Plan  Clinical staff conducted group or individual video education with verbal and written material and guidebook.  Patient learns about the [place] for disease risk reduction. The [place] emphasizes a wide variety of unrefined, minimally-processed carbohydrates, like fruits, vegetables, whole grains, and legumes. Go, Caution, and Stop food choices are explained. Plant-based and lean animal proteins are emphasized. Rationale provided for low sodium intake for blood pressure control, low added sugars for blood sugar stabilization, and low added fats and oils for coronary artery disease risk reduction and weight management.  Calorie Density  Clinical staff conducted group or individual video education with verbal and written material and guidebook.  Patient learns about calorie density and how it impacts the Pritikin Eating Plan. Knowing the characteristics of the food you choose will help you decide whether those foods will lead to weight gain or weight loss, and whether you want to consume more or less of them. Weight loss is usually a side effect of the Pritikin Eating Plan because of its focus on low calorie-dense foods.  Label Reading  Clinical staff conducted group or individual video education with verbal and written material and guidebook.  Patient learns about the Pritikin recommended label reading guidelines and corresponding recommendations regarding calorie density, added sugars, sodium content, and whole grains.  Dining Out - Part 1  Clinical staff conducted group or individual video education with verbal and written  material and guidebook.  Patient learns that restaurant meals can be sabotaging because they can be so high in calories, fat, sodium, and/or sugar. Patient learns recommended strategies on how to positively address this and avoid unhealthy pitfalls.  Facts on Fats  Clinical staff conducted group or individual video education with verbal and written material and guidebook.  Patient learns that lifestyle modifications can be just as effective, if not more so, as many medications for lowering your risk of heart disease. A Pritikin lifestyle can help to reduce your risk of inflammation and atherosclerosis (cholesterol build-up, or plaque, in the artery walls). Lifestyle interventions such as dietary choices and physical activity address the cause of atherosclerosis. A review of the types of fats and their impact on blood cholesterol levels, along with dietary recommendations to reduce fat intake is also included.  Nutrition Action Plan  Clinical staff conducted group or individual video education with verbal and written material and guidebook.  Patient learns how to incorporate Pritikin recommendations into their lifestyle. Recommendations include planning and keeping personal health goals in mind as an important part of their success.  Healthy Mind-Set    Healthy Minds, Bodies, Hearts  Clinical staff conducted group or individual video education with verbal and written material and guidebook.  Patient learns how to identify when they are stressed. Video will discuss the impact of that stress, as well as the many benefits of stress management. Patient will also be introduced to stress management techniques. The way we think, act, and feel has an impact on our hearts.  How Our Thoughts Can Heal Our Hearts  Clinical staff conducted group or individual video education with verbal and written material and guidebook.  Patient learns that negative thoughts can cause depression and  anxiety. This can result in  negative lifestyle behavior and serious health problems. Cognitive behavioral therapy is an effective method to help control our thoughts in order to change and improve our emotional outlook.  Additional Videos:  Exercise    Improving Performance  Clinical staff conducted group or individual video education with verbal and written material and guidebook.  Patient learns to use a non-linear approach by alternating intensity levels and lengths of time spent exercising to help burn more calories and lose more body fat. Cardiovascular exercise helps improve heart health, metabolism, hormonal balance, blood sugar control, and recovery from fatigue. Resistance training improves strength, endurance, balance, coordination, reaction time, metabolism, and muscle mass. Flexibility exercise improves circulation, posture, and balance. Seek guidance from your physician and exercise physiologist before implementing an exercise routine and learn your capabilities and proper form for all exercise.  Introduction to Yoga  Clinical staff conducted group or individual video education with verbal and written material and guidebook.  Patient learns about yoga, a discipline of the coming together of mind, breath, and body. The benefits of yoga include improved flexibility, improved range of motion, better posture and core strength, increased lung function, weight loss, and positive self-image. Yoga's heart health benefits include lowered blood pressure, healthier heart rate, decreased cholesterol and triglyceride levels, improved immune function, and reduced stress. Seek guidance from your physician and exercise physiologist before implementing an exercise routine and learn your capabilities and proper form for all exercise.  Medical   Aging: Enhancing Your Quality of Life  Clinical staff conducted group or individual video education with verbal and written material and guidebook.  Patient learns key strategies and  recommendations to stay in good physical health and enhance quality of life, such as prevention strategies, having an advocate, securing a [place], and keeping a list of medications and system for tracking them. It also discusses how to avoid risk for bone loss.  Biology of Weight Control  Clinical staff conducted group or individual video education with verbal and written material and guidebook.  Patient learns that weight gain occurs because we consume more calories than we burn (eating more, moving less). Even if your body weight is normal, you may have higher ratios of fat compared to muscle mass. Too much body fat puts you at increased risk for cardiovascular disease, heart attack, stroke, type 2 diabetes, and obesity-related cancers. In addition to exercise, following the [place] can help reduce your risk.  Decoding Lab Results  Clinical staff conducted group or individual video education with verbal and written material and guidebook.  Patient learns that lab test reflects one measurement whose values change over time and are influenced by many factors, including medication, stress, sleep, exercise, food, hydration, pre-existing medical conditions, and more. It is recommended to use the knowledge from this video to become more involved with your lab results and evaluate your numbers to speak with your doctor.   Diseases of Our Time - Overview  Clinical staff conducted group or individual video education with verbal and written material and guidebook.  Patient learns that according to the CDC, 50% to 70% of chronic diseases (such as obesity, type 2 diabetes, elevated lipids, hypertension, and heart disease) are avoidable through lifestyle improvements including healthier food choices, listening to satiety cues, and increased physical activity.  Sleep Disorders Clinical staff conducted group or individual video education with verbal and written  material and guidebook.  Patient learns how good quality and duration  of sleep are important to overall health and well-being. Patient also learns about sleep disorders and how they impact health along with recommendations to address them, including discussing with a physician.  Nutrition  Dining Out - Part 2 Clinical staff conducted group or individual video education with verbal and written material and guidebook.  Patient learns how to plan ahead and communicate in order to maximize their dining experience in a healthy and nutritious manner. Included are recommended food choices based on the type of restaurant the patient is visiting.   Fueling a Best boy conducted group or individual video education with verbal and written material and guidebook.  There is a strong connection between our food choices and our health. Diseases like obesity and type 2 diabetes are very prevalent and are in large-part due to lifestyle choices. The Pritikin Eating Plan provides plenty of food and hunger-curbing satisfaction. It is easy to follow, affordable, and helps reduce health risks.  Menu Workshop  Clinical staff conducted group or individual video education with verbal and written material and guidebook.  Patient learns that restaurant meals can sabotage health goals because they are often packed with calories, fat, sodium, and sugar. Recommendations include strategies to plan ahead and to communicate with the manager, chef, or server to help order a healthier meal.  Planning Your Eating Strategy  Clinical staff conducted group or individual video education with verbal and written material and guidebook.  Patient learns about the [place] and its benefit of reducing the risk of disease. The [place] does not focus on calories. Instead, it emphasizes high-quality, nutrient-rich foods. By knowing the characteristics of the foods, we choose, we can determine their  calorie density and make informed decisions.  Targeting Your Nutrition Priorities  Clinical staff conducted group or individual video education with verbal and written material and guidebook.  Patient learns that lifestyle habits have a tremendous impact on disease risk and progression. This video provides eating and physical activity recommendations based on your personal health goals, such as reducing LDL cholesterol, losing weight, preventing or controlling type 2 diabetes, and reducing high blood pressure.  Vitamins and Minerals  Clinical staff conducted group or individual video education with verbal and written material and guidebook.  Patient learns different ways to obtain key vitamins and minerals, including through a recommended healthy diet. It is important to discuss all supplements you take with your doctor.   Healthy Mind-Set    Smoking Cessation  Clinical staff conducted group or individual video education with verbal and written material and guidebook.  Patient learns that cigarette smoking and tobacco addiction pose a serious health risk which affects millions of people. Stopping smoking will significantly reduce the risk of heart disease, lung disease, and many forms of cancer. Recommended strategies for quitting are covered, including working with your doctor to develop a successful plan.  Culinary   Becoming a Financial trader conducted group or individual video education with verbal and written material and guidebook.  Patient learns that cooking at home can be healthy, cost-effective, quick, and puts them in control. Keys to cooking healthy recipes will include looking at your recipe, assessing your equipment needs, planning ahead, making it simple, choosing cost-effective seasonal ingredients, and limiting the use of added fats, salts, and sugars.  Cooking - Breakfast and Snacks  Clinical staff conducted group or individual video education with verbal and  written material and guidebook.  Patient learns how important breakfast is to  satiety and nutrition through the entire day. Recommendations include key foods to eat during breakfast to help stabilize blood sugar levels and to prevent overeating at meals later in the day. Planning ahead is also a key component.  Cooking - Human resources officer conducted group or individual video education with verbal and written material and guidebook.  Patient learns eating strategies to improve overall health, including an approach to cook more at home. Recommendations include thinking of animal protein as a side on your plate rather than center stage and focusing instead on lower calorie dense options like vegetables, fruits, whole grains, and plant-based proteins, such as beans. Making sauces in large quantities to freeze for later and leaving the skin on your vegetables are also recommended to maximize your experience.  Cooking - Healthy Salads and Dressing Clinical staff conducted group or individual video education with verbal and written material and guidebook.  Patient learns that vegetables, fruits, whole grains, and legumes are the foundations of the [place]. Recommendations include how to incorporate each of these in flavorful and healthy salads, and how to create homemade salad dressings. Proper handling of ingredients is also covered. Cooking - Soups and [employer] - Soups and Desserts Clinical staff conducted group or individual video education with verbal and written material and guidebook.  Patient learns that Pritikin soups and desserts make for easy, nutritious, and delicious snacks and meal components that are low in sodium, fat, sugar, and calorie density, while high in vitamins, minerals, and filling fiber. Recommendations include simple and healthy ideas for soups and desserts.   Overview     The Pritikin Solution Program Overview Clinical staff conducted group or  individual video education with verbal and written material and guidebook.  Patient learns that the results of the [place] Program have been documented in more than 100 articles published in peer-reviewed journals, and the benefits include reducing risk factors for (and, in some cases, even reversing) high cholesterol, high blood pressure, type 2 diabetes, obesity, and more! An overview of the three key pillars of the Pritikin Program will be covered: eating well, doing regular exercise, and having a healthy mind-set.  WORKSHOPS  Exercise: Exercise Basics: Building Your Action Plan Clinical staff led group instruction and group discussion with PowerPoint presentation and patient guidebook. To enhance the learning environment the use of posters, models and videos may be added. At the conclusion of this workshop, patients will comprehend the difference between physical activity and exercise, as well as the benefits of incorporating both, into their routine. Patients will understand the FITT (Frequency, Intensity, Time, and Type) principle and how to use it to build an exercise action plan. In addition, safety concerns and other considerations for exercise and cardiac rehab will be addressed by the presenter. The purpose of this lesson is to promote a comprehensive and effective weekly exercise routine in order to improve patients' overall level of fitness.   Managing Heart Disease: Your Path to a Healthier Heart Clinical staff led group instruction and group discussion with PowerPoint presentation and patient guidebook. To enhance the learning environment the use of posters, models and videos may be added.At the conclusion of this workshop, patients will understand the anatomy and physiology of the heart. Additionally, they will understand how Pritikin's three pillars impact the risk factors, the progression, and the management of heart disease.  The purpose of this lesson is to provide a high-level  overview of the heart, heart disease, and how the Pritikin  lifestyle positively impacts risk factors.  Exercise Biomechanics Clinical staff led group instruction and group discussion with PowerPoint presentation and patient guidebook. To enhance the learning environment the use of posters, models and videos may be added. Patients will learn how the structural parts of their bodies function and how these functions impact their daily activities, movement, and exercise. Patients will learn how to promote a neutral spine, learn how to manage pain, and identify ways to improve their physical movement in order to promote healthy living. The purpose of this lesson is to expose patients to common physical limitations that impact physical activity. Participants will learn practical ways to adapt and manage aches and pains, and to minimize their effect on regular exercise. Patients will learn how to maintain good posture while sitting, walking, and lifting.  Balance Training and Fall Prevention  Clinical staff led group instruction and group discussion with PowerPoint presentation and patient guidebook. To enhance the learning environment the use of posters, models and videos may be added. At the conclusion of this workshop, patients will understand the importance of their sensorimotor skills (vision, proprioception, and the vestibular system) in maintaining their ability to balance as they age. Patients will apply a variety of balancing exercises that are appropriate for their current level of function. Patients will understand the common causes for poor balance, possible solutions to these problems, and ways to modify their physical environment in order to minimize their fall risk. The purpose of this lesson is to teach patients about the importance of maintaining balance as they age and ways to minimize their risk of falling.  WORKSHOPS   Nutrition:  Fueling a Scientist, research (physical sciences) led group  instruction and group discussion with PowerPoint presentation and patient guidebook. To enhance the learning environment the use of posters, models and videos may be added. Patients will review the foundational principles of the [place] and understand what constitutes a serving size in each of the food groups. Patients will also learn Pritikin-friendly foods that are better choices when away from home and review make-ahead meal and snack options. Calorie density will be reviewed and applied to three nutrition priorities: weight maintenance, weight loss, and weight gain. The purpose of this lesson is to reinforce (in a group setting) the key concepts around what patients are recommended to eat and how to apply these guidelines when away from home by planning and selecting Pritikin-friendly options. Patients will understand how calorie density may be adjusted for different weight management goals.  Mindful Eating  Clinical staff led group instruction and group discussion with PowerPoint presentation and patient guidebook. To enhance the learning environment the use of posters, models and videos may be added. Patients will briefly review the concepts of the [place] and the importance of low-calorie dense foods. The concept of mindful eating will be introduced as well as the importance of paying attention to internal hunger signals. Triggers for non-hunger eating and techniques for dealing with triggers will be explored. The purpose of this lesson is to provide patients with the opportunity to review the basic principles of the [place], discuss the value of eating mindfully and how to measure internal cues of hunger and fullness using the Hunger Scale. Patients will also discuss reasons for non-hunger eating and learn strategies to use for controlling emotional eating.  Targeting Your Nutrition Priorities Clinical staff led group instruction and group discussion with  PowerPoint presentation and patient guidebook. To enhance the learning environment the use of  posters, models and videos may be added. Patients will learn how to determine their genetic susceptibility to disease by reviewing their family history. Patients will gain insight into the importance of diet as part of an overall healthy lifestyle in mitigating the impact of genetics and other environmental insults. The purpose of this lesson is to provide patients with the opportunity to assess their personal nutrition priorities by looking at their family history, their own health history and current risk factors. Patients will also be able to discuss ways of prioritizing and modifying the [place] for their highest risk areas  Menu  Clinical staff led group instruction and group discussion with PowerPoint presentation and patient guidebook. To enhance the learning environment the use of posters, models and videos may be added. Using menus brought in from [employer], or printed from [employer], patients will apply the [place] dining out guidelines that were presented in the [employer] video. Patients will also be able to practice these guidelines in a variety of provided scenarios. The purpose of this lesson is to provide patients with the opportunity to practice hands-on learning of the [place] with actual menus and practice scenarios.  Label Reading Clinical staff led group instruction and group discussion with PowerPoint presentation and patient guidebook. To enhance the learning environment the use of posters, models and videos may be added. Patients will review and discuss the Pritikin label reading guidelines presented in Pritikin's Label Reading Educational series video. Using fool labels brought in from local grocery stores and markets, patients will apply the label reading guidelines and determine if the packaged food meet the Pritikin  guidelines. The purpose of this lesson is to provide patients with the opportunity to review, discuss, and practice hands-on learning of the Pritikin Label Reading guidelines with actual packaged food labels. [place] Workshops are designed to teach patients ways to prepare quick, simple, and affordable recipes at home. The importance of nutrition's role in chronic disease risk reduction is reflected in its emphasis in the overall Pritikin program. By learning how to prepare essential core Pritikin Eating Plan recipes, patients will increase control over what they eat; be able to customize the flavor of foods without the use of added salt, sugar, or fat; and improve the quality of the food they consume. By learning a set of core recipes which are easily assembled, quickly prepared, and affordable, patients are more likely to prepare more healthy foods at home. These workshops focus on convenient breakfasts, simple entres, side dishes, and desserts which can be prepared with minimal effort and are consistent with nutrition recommendations for cardiovascular risk reduction. Cooking [employer] are taught by a Engineer, materials (RD) who has been trained by the [employer]. The chef or RD has a clear understanding of the importance of minimizing - if not completely eliminating - added fat, sugar, and sodium in recipes. Throughout the series of [place] Workshop sessions, patients will learn about healthy ingredients and efficient methods of cooking to build confidence in their capability to prepare    Cooking School weekly topics:  Adding Flavor- Sodium-Free  Fast and Healthy Breakfasts  Powerhouse Plant-Based Proteins  Satisfying Salads and Dressings  Simple Sides and Sauces  International Cuisine-Spotlight on the [place] Zones  Delicious Desserts  Savory Soups  [employer] - Meals in a Agricultural consultant Appetizers and  Snacks  Comforting Weekend Breakfasts  One-Pot Wonders   Fast  Evening Meals  Easy Entertaining  Personalizing Your Pritikin Plate  WORKSHOPS   Healthy Mindset (Psychosocial): New Thoughts, New Behaviors Clinical staff led group instruction and group discussion with PowerPoint presentation and patient guidebook. To enhance the learning environment the use of posters, models and videos may be added. Patients will learn and practice techniques for developing effective health and lifestyle goals. Patients will be able to effectively apply the goal setting process learned to develop at least one new personal goal.  The purpose of this lesson is to expose patients to a new skill set of behavior modification techniques such as techniques setting SMART goals, overcoming barriers, and achieving new thoughts and new behaviors.  Managing Moods and Relationships Clinical staff led group instruction and group discussion with PowerPoint presentation and patient guidebook. To enhance the learning environment the use of posters, models and videos may be added. Patients will learn how emotional and chronic stress factors can impact their health and relationships. They will learn healthy ways to manage their moods and utilize positive coping mechanisms. In addition, ICR patients will learn ways to improve communication skills. The purpose of this lesson is to expose patients to ways of understanding how one's mood and health are intimately connected. Developing a healthy outlook can help build positive relationships and connections with others. Patients will understand the importance of utilizing effective communication skills that include actively listening and being heard. They will learn and understand the importance of the "4 Cs" and especially Connections in fostering of a Healthy Mind-Set.  Healthy Sleep for a Healthy Heart Clinical staff led group instruction and group discussion with PowerPoint presentation  and patient guidebook. To enhance the learning environment the use of posters, models and videos may be added. At the conclusion of this workshop, patients will be able to demonstrate knowledge of the importance of sleep to overall health, well-being, and quality of life. They will understand the symptoms of, and treatments for, common sleep disorders. Patients will also be able to identify daytime and nighttime behaviors which impact sleep, and they will be able to apply these tools to help manage sleep-related challenges. The purpose of this lesson is to provide patients with a general overview of sleep and outline the importance of quality sleep. Patients will learn about a few of the most common sleep disorders. Patients will also be introduced to the concept of "sleep hygiene," and discover ways to self-manage certain sleeping problems through simple daily behavior changes. Finally, the workshop will motivate patients by clarifying the links between quality sleep and their goals of heart-healthy living.   Recognizing and Reducing Stress Clinical staff led group instruction and group discussion with PowerPoint presentation and patient guidebook. To enhance the learning environment the use of posters, models and videos may be added. At the conclusion of this workshop, patients will be able to understand the types of stress reactions, differentiate between acute and chronic stress, and recognize the impact that chronic stress has on their health. They will also be able to apply different coping mechanisms, such as reframing negative self-talk. Patients will have the opportunity to practice a variety of stress management techniques, such as deep abdominal breathing, progressive muscle relaxation, and/or guided imagery.  The purpose of this lesson is to educate patients on the role of stress in their lives and to provide healthy techniques for coping with it.  Learning Barriers/Preferences:  Learning  Barriers/Preferences - 01/22/22 1227       Learning Barriers/Preferences   Learning Barriers Sight;Hearing  pt wears glasses and bilateral hearing aids   Learning Preferences Group Instruction;Individual Instruction;Skilled Demonstration;Verbal Instruction             Education Topics:  Knowledge Questionnaire Score:  Knowledge Questionnaire Score - 01/22/22 1229       Knowledge Questionnaire Score   Pre Score 23/24             Core Components/Risk Factors/Patient Goals at Admission:  Personal Goals and Risk Factors at Admission - 01/22/22 1230       Core Components/Risk Factors/Patient Goals on Admission    Weight Management Yes;Obesity;Weight Loss    Intervention Weight Management: Develop a combined nutrition and exercise program designed to reach desired caloric intake, while maintaining appropriate intake of nutrient and fiber, sodium and fats, and appropriate energy expenditure required for the weight goal.;Weight Management: Provide education and appropriate resources to help participant work on and attain dietary goals.;Weight Management/Obesity: Establish reasonable short term and long term weight goals.;Obesity: Provide education and appropriate resources to help participant work on and attain dietary goals.    Admit Weight 168 lb 6.9 oz (76.4 kg)    Goal Weight: Short Term 158 lb (71.7 kg)    Expected Outcomes Short Term: Continue to assess and modify interventions until short term weight is achieved;Long Term: Adherence to nutrition and physical activity/exercise program aimed toward attainment of established weight goal;Weight Loss: Understanding of general recommendations for a balanced deficit meal plan, which promotes 1-2 lb weight loss per week and includes a negative energy balance of [phone] kcal/d;Understanding recommendations for meals to include 15-35% energy as protein, 25-35% energy from fat, 35-60% energy from carbohydrates, less than '200mg'$  of dietary  cholesterol, 20-35 gm of total fiber daily;Understanding of distribution of calorie intake throughout the day with the consumption of 4-5 meals/snacks    Diabetes Yes    Intervention Provide education about signs/symptoms and action to take for hypo/hyperglycemia.;Provide education about proper nutrition, including hydration, and aerobic/resistive exercise prescription along with prescribed medications to achieve blood glucose in normal ranges: Fasting glucose 65-99 mg/dL    Expected Outcomes Short Term: Participant verbalizes understanding of the signs/symptoms and immediate care of hyper/hypoglycemia, proper foot care and importance of medication, aerobic/resistive exercise and nutrition plan for blood glucose control.;Long Term: Attainment of HbA1C < 7%.    Heart Failure Yes    Intervention Provide a combined exercise and nutrition program that is supplemented with education, support and counseling about heart failure. Directed toward relieving symptoms such as shortness of breath, decreased exercise tolerance, and extremity edema.    Expected Outcomes Short term: Attendance in program 2-3 days a week with increased exercise capacity. Reported lower sodium intake. Reported increased fruit and vegetable intake. Reports medication compliance.;Improve functional capacity of life;Short term: Daily weights obtained and reported for increase. Utilizing diuretic protocols set by physician.;Long term: Adoption of self-care skills and reduction of barriers for early signs and symptoms recognition and intervention leading to self-care maintenance.    Hypertension Yes    Intervention Provide education on lifestyle modifcations including regular physical activity/exercise, weight management, moderate sodium restriction and increased consumption of fresh fruit, vegetables, and low fat dairy, alcohol moderation, and smoking cessation.;Monitor prescription use compliance.    Expected Outcomes Short Term: Continued  assessment and intervention until BP is < 140/98m HG in hypertensive participants. < 130/880mHG in hypertensive participants with diabetes, heart failure or chronic kidney disease.;Long Term: Maintenance of blood pressure at goal levels.    Lipids Yes    Intervention  Provide education and support for participant on nutrition & aerobic/resistive exercise along with prescribed medications to achieve LDL '70mg'$ , HDL >'40mg'$ .    Expected Outcomes Short Term: Participant states understanding of desired cholesterol values and is compliant with medications prescribed. Participant is following exercise prescription and nutrition guidelines.;Long Term: Cholesterol controlled with medications as prescribed, with individualized exercise RX and with personalized nutrition plan. Value goals: LDL < '70mg'$ , HDL > 40 mg.             Core Components/Risk Factors/Patient Goals Review:   Goals and Risk Factor Review     Row Name 01/25/22 1437 02/09/22 1443 03/05/22 0850         Core Components/Risk Factors/Patient Goals Review   Personal Goals Review Weight Management/Obesity;Heart Failure;Hypertension;Lipids;Diabetes Weight Management/Obesity;Heart Failure;Hypertension;Lipids;Diabetes Weight Management/Obesity;Heart Failure;Hypertension;Lipids;Diabetes     Review Shannon Moore started intensive cardiac rehab on 01/25/22. Shannon Moore did well with exercise. Vital signs stable. CBG 209 pre exercise, post exercise CBg 166. Will contibnue to monitor CBG's. Shannon Moore started intensive cardiac rehab on 01/25/22. Shannon Moore and has been doing well with exercise. Vital signs and CBG's have been stable. Shannon Moore started intensive cardiac rehab on 01/25/22. Shannon Moore and has been doing well with exercise. Shannon Moore has had some intermitent exertional systolic BP's CBG's have been stable. Sent message to Dr Johney Frame for review.     Expected Outcomes Shannon Moore will continue to participate in intensive cardiac rehab for exercise, nutrition and lifestyle  modificications. Shannon Moore will continue to participate in intensive cardiac rehab for exercise, nutrition and lifestyle modificications. Shannon Moore will continue to participate in intensive cardiac rehab for exercise, nutrition and lifestyle modificications.              Core Components/Risk Factors/Patient Goals at Discharge (Final Review):   Goals and Risk Factor Review - 03/05/22 0850       Core Components/Risk Factors/Patient Goals Review   Personal Goals Review Weight Management/Obesity;Heart Failure;Hypertension;Lipids;Diabetes    Review Shannon Moore started intensive cardiac rehab on 01/25/22. Shannon Moore and has been doing well with exercise. Shannon Moore has had some intermitent exertional systolic BP's CBG's have been stable. Sent message to Dr Johney Frame for review.    Expected Outcomes Shannon Moore will continue to participate in intensive cardiac rehab for exercise, nutrition and lifestyle modificications.             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 Introduction to pritikin education program/ intensive cardiac rehab. Initital orientation packet reviewed with the patient. 30 Day ITP Review. Shannon Moore started intensive cardiac rehab on 01/25/22 and did well with exercise 30 Day ITP Review. Shannon Moore is off to a good start to  intensive cardiac rehab  and is doing well with exercise 30 Day ITP Review. Shannon Moore has good attendance and participation in intensive cardiac rehab  and is doing well with exercise             Comments: See ITP Comments

## 2022-03-08 ENCOUNTER — Ambulatory Visit (HOSPITAL_COMMUNITY): Payer: Medicare HMO

## 2022-03-08 ENCOUNTER — Encounter (HOSPITAL_COMMUNITY)
Admission: RE | Admit: 2022-03-08 | Discharge: 2022-03-08 | Disposition: A | Discharge status: Home / Self Care | Payer: Medicare Other | Source: Ambulatory Visit | Attending: Cardiology | Admitting: Cardiology

## 2022-03-08 ENCOUNTER — Encounter (HOSPITAL_COMMUNITY): Payer: Medicare Other

## 2022-03-08 DIAGNOSIS — Z955 Presence of coronary angioplasty implant and graft: Secondary | ICD-10-CM

## 2022-03-08 DIAGNOSIS — I2510 Atherosclerotic heart disease of native coronary artery without angina pectoris: Secondary | ICD-10-CM | POA: Diagnosis not present

## 2022-03-10 ENCOUNTER — Encounter (HOSPITAL_COMMUNITY): Payer: Medicare Other

## 2022-03-10 ENCOUNTER — Encounter (HOSPITAL_COMMUNITY)
Admission: RE | Admit: 2022-03-10 | Discharge: 2022-03-10 | Disposition: A | Discharge status: Home / Self Care | Payer: Medicare Other | Source: Ambulatory Visit | Attending: Cardiology | Admitting: Cardiology

## 2022-03-10 ENCOUNTER — Ambulatory Visit (HOSPITAL_COMMUNITY): Payer: Medicare HMO

## 2022-03-10 DIAGNOSIS — I2510 Atherosclerotic heart disease of native coronary artery without angina pectoris: Secondary | ICD-10-CM | POA: Diagnosis not present

## 2022-03-10 DIAGNOSIS — Z955 Presence of coronary angioplasty implant and graft: Secondary | ICD-10-CM

## 2022-03-12 ENCOUNTER — Encounter (HOSPITAL_COMMUNITY): Payer: Medicare Other

## 2022-03-12 ENCOUNTER — Ambulatory Visit (HOSPITAL_COMMUNITY): Payer: Medicare HMO

## 2022-03-15 ENCOUNTER — Encounter (HOSPITAL_COMMUNITY)
Admission: RE | Admit: 2022-03-15 | Discharge: 2022-03-15 | Disposition: A | Discharge status: Home / Self Care | Payer: Medicare Other | Source: Ambulatory Visit | Attending: Cardiology | Admitting: Cardiology

## 2022-03-15 ENCOUNTER — Encounter (HOSPITAL_COMMUNITY): Payer: Medicare Other

## 2022-03-15 DIAGNOSIS — Z48812 Encounter for surgical aftercare following surgery on the circulatory system: Secondary | ICD-10-CM | POA: Diagnosis not present

## 2022-03-15 DIAGNOSIS — Z955 Presence of coronary angioplasty implant and graft: Secondary | ICD-10-CM | POA: Diagnosis present

## 2022-03-17 ENCOUNTER — Encounter (HOSPITAL_COMMUNITY)
Admission: RE | Admit: 2022-03-17 | Discharge: 2022-03-17 | Disposition: A | Discharge status: Home / Self Care | Payer: Medicare Other | Source: Ambulatory Visit | Attending: Cardiology | Admitting: Cardiology

## 2022-03-17 ENCOUNTER — Encounter (HOSPITAL_COMMUNITY): Payer: Medicare Other

## 2022-03-17 DIAGNOSIS — Z955 Presence of coronary angioplasty implant and graft: Secondary | ICD-10-CM

## 2022-03-19 ENCOUNTER — Encounter (HOSPITAL_COMMUNITY)
Admission: RE | Admit: 2022-03-19 | Discharge: 2022-03-19 | Disposition: A | Discharge status: Home / Self Care | Payer: Medicare Other | Source: Ambulatory Visit | Attending: Cardiology | Admitting: Cardiology

## 2022-03-19 ENCOUNTER — Encounter (HOSPITAL_COMMUNITY): Payer: Medicare Other

## 2022-03-19 DIAGNOSIS — Z955 Presence of coronary angioplasty implant and graft: Secondary | ICD-10-CM | POA: Diagnosis not present

## 2022-03-22 ENCOUNTER — Encounter (HOSPITAL_COMMUNITY)
Admission: RE | Admit: 2022-03-22 | Discharge: 2022-03-22 | Disposition: A | Discharge status: Home / Self Care | Payer: Medicare Other | Source: Ambulatory Visit | Attending: Cardiology | Admitting: Cardiology

## 2022-03-22 VITALS — BP 114/68 | HR 81 | Ht 62.0 in | Wt 164.9 lb

## 2022-03-22 DIAGNOSIS — Z955 Presence of coronary angioplasty implant and graft: Secondary | ICD-10-CM | POA: Diagnosis not present

## 2022-03-22 NOTE — Progress Notes (Signed)
Discharge Progress Report  Patient Details  Name: ISABELLA CHELTON MRN: [id number] Date of Birth: 1945/09/25 Referring Provider:   Flowsheet Row INTENSIVE CARDIAC REHAB ORIENT from 01/22/2022 in [facility]  Referring Provider Gwyndolyn Kaufman, MD        Number of Visits: 47  Reason for Discharge:  Patient reached a stable level of exercise. Patient independent in their exercise. Patient has met program and personal goals.  Smoking History:  Social History   Tobacco Use  Smoking Status Former   Packs/day: 1.00   Years: 8.00   Additional pack years: 0.00   Total pack years: 8.00   Types: Cigarettes   Quit date: 11/10/1977   Years since quitting: 44.4  Smokeless Tobacco Never    Diagnosis:  Status post coronary artery stent placement  ADL UCSD:   Initial Exercise Prescription:  Initial Exercise Prescription - 01/22/22 1200       Date of Initial Exercise RX and Referring Provider   Date 01/22/22    Referring Provider Gwyndolyn Kaufman, MD    Expected Discharge Date 03/19/22      Arm Ergometer   Level 1    RPM 50    Minutes 15    METs 2      T5 Nustep   Level 1    SPM 75    Minutes 15    METs 2      Prescription Details   Frequency (times per week) 3    Duration Progress to 30 minutes of continuous aerobic without signs/symptoms of physical distress      Intensity   THRR 40-80% of Max Heartrate 58-115    Ratings of Perceived Exertion 11-13    Perceived Dyspnea 0-4      Progression   Progression Continue to progress workloads to maintain intensity without signs/symptoms of physical distress.      Resistance Training   Training Prescription Yes    Weight 2    Reps 10-15             Discharge Exercise Prescription (Final Exercise Prescription Changes):  Exercise Prescription Changes - 03/22/22 1022       Response to Exercise   Blood Pressure (Admit) 128/58    Blood Pressure  (Exercise) 176/74    Blood Pressure (Exit) 122/68    Heart Rate (Admit) 96 bpm    Heart Rate (Exercise) 116 bpm    Heart Rate (Exit) 83 bpm    Rating of Perceived Exertion (Exercise) 11    Symptoms None    Comments Patient completed the cardiac rehab program today.    Duration Continue with 30 min of aerobic exercise without signs/symptoms of physical distress.    Intensity THRR unchanged      Progression   Progression Continue to progress workloads to maintain intensity without signs/symptoms of physical distress.    Average METs 2.8      Resistance Training   Training Prescription Yes    Weight 2    Reps 10-15    Time 10 Minutes      Interval Training   Interval Training No      NuStep   Level 2    SPM 113    Minutes 15    METs 3      Arm Ergometer   Level 2    Watts 19    RPM 50    Minutes 15    METs 2.7  Home Exercise Plan   Plans to continue exercise at Home (comment)   Walking   Frequency Add 1 additional day to program exercise sessions.    Initial Home Exercises Provided 02/15/22             Functional Capacity:  6 Minute Walk     Row Name 01/22/22 1222 03/19/22 1057       6 Minute Walk   Phase Initial Discharge    Distance 1080 feet 1349 feet    Distance % Change -- 24.91 %    Distance Feet Change -- 269 ft    Walk Time 6 minutes 6 minutes    # of Rest Breaks 0 0    MPH 2.05 2.55    METS 2.28 2.92    RPE 9 11    Perceived Dyspnea  1  towards end of test 0    VO2 Peak 7.98 10.21    Symptoms Yes (comment) No    Comments SOB at end of test --    Resting HR 63 bpm 81 bpm    Resting BP 138/66 114/68    Resting Oxygen Saturation  97 % --    Exercise Oxygen Saturation  during 6 min walk 97 % --    Max Ex. HR 116 bpm 120 bpm    Max Ex. BP 154/60 164/50    2 Minute Post BP 140/70 122/70             Psychological, QOL, Others - Outcomes: PHQ 2/9:    03/22/2022   11:25 AM 01/22/2022   12:21 PM 06/10/2016    9:07 AM  Depression  screen PHQ 2/9  Decreased Interest 0 0 0  Down, Depressed, Hopeless 0 0 0  PHQ - 2 Score 0 0 0  Altered sleeping 0 1   Tired, decreased energy 0 0   Change in appetite 0 1   Feeling bad or failure about yourself  0 0   Trouble concentrating 0 0   Moving slowly or fidgety/restless 0 0   Suicidal thoughts 0 0   PHQ-9 Score 0 2   Difficult doing work/chores  Not difficult at all     Quality of Life:  Quality of Life - 03/08/22 1714       Quality of Life   Select Quality of Life      Quality of Life Scores   Health/Function Pre 29.2 %    Health/Function Post 28.3 %    Health/Function % Change -3.08 %    Socioeconomic Pre 30 %    Socioeconomic Post 30 %    Socioeconomic % Change  0 %    Psych/Spiritual Pre 27.43 %    Psych/Spiritual Post 28.57 %    Psych/Spiritual % Change 4.16 %    Family Pre 28.8 %    Family Post 26.4 %    Family % Change -8.33 %    GLOBAL Pre 28.97 %    GLOBAL Post 28.38 %    GLOBAL % Change -2.04 %             Personal Goals: Goals established at orientation with interventions provided to work toward goal.  Personal Goals and Risk Factors at Admission - 01/22/22 1230       Core Components/Risk Factors/Patient Goals on Admission    Weight Management Yes;Obesity;Weight Loss    Intervention Weight Management: Develop a combined nutrition and exercise program designed to reach desired caloric intake, while maintaining appropriate intake of  nutrient and fiber, sodium and fats, and appropriate energy expenditure required for the weight goal.;Weight Management: Provide education and appropriate resources to help participant work on and attain dietary goals.;Weight Management/Obesity: Establish reasonable short term and long term weight goals.;Obesity: Provide education and appropriate resources to help participant work on and attain dietary goals.    Admit Weight 168 lb 6.9 oz (76.4 kg)    Goal Weight: Short Term 158 lb (71.7 kg)    Expected Outcomes  Short Term: Continue to assess and modify interventions until short term weight is achieved;Long Term: Adherence to nutrition and physical activity/exercise program aimed toward attainment of established weight goal;Weight Loss: Understanding of general recommendations for a balanced deficit meal plan, which promotes 1-2 lb weight loss per week and includes a negative energy balance of [phone] kcal/d;Understanding recommendations for meals to include 15-35% energy as protein, 25-35% energy from fat, 35-60% energy from carbohydrates, less than 200mg  of dietary cholesterol, 20-35 gm of total fiber daily;Understanding of distribution of calorie intake throughout the day with the consumption of 4-5 meals/snacks    Diabetes Yes    Intervention Provide education about signs/symptoms and action to take for hypo/hyperglycemia.;Provide education about proper nutrition, including hydration, and aerobic/resistive exercise prescription along with prescribed medications to achieve blood glucose in normal ranges: Fasting glucose 65-99 mg/dL    Expected Outcomes Short Term: Participant verbalizes understanding of the signs/symptoms and immediate care of hyper/hypoglycemia, proper foot care and importance of medication, aerobic/resistive exercise and nutrition plan for blood glucose control.;Long Term: Attainment of HbA1C < 7%.    Heart Failure Yes    Intervention Provide a combined exercise and nutrition program that is supplemented with education, support and counseling about heart failure. Directed toward relieving symptoms such as shortness of breath, decreased exercise tolerance, and extremity edema.    Expected Outcomes Short term: Attendance in program 2-3 days a week with increased exercise capacity. Reported lower sodium intake. Reported increased fruit and vegetable intake. Reports medication compliance.;Improve functional capacity of life;Short term: Daily weights obtained and reported for increase. Utilizing  diuretic protocols set by physician.;Long term: Adoption of self-care skills and reduction of barriers for early signs and symptoms recognition and intervention leading to self-care maintenance.    Hypertension Yes    Intervention Provide education on lifestyle modifcations including regular physical activity/exercise, weight management, moderate sodium restriction and increased consumption of fresh fruit, vegetables, and low fat dairy, alcohol moderation, and smoking cessation.;Monitor prescription use compliance.    Expected Outcomes Short Term: Continued assessment and intervention until BP is < 140/25mm HG in hypertensive participants. < 130/72mm HG in hypertensive participants with diabetes, heart failure or chronic kidney disease.;Long Term: Maintenance of blood pressure at goal levels.    Lipids Yes    Intervention Provide education and support for participant on nutrition & aerobic/resistive exercise along with prescribed medications to achieve LDL 70mg , HDL >40mg .    Expected Outcomes Short Term: Participant states understanding of desired cholesterol values and is compliant with medications prescribed. Participant is following exercise prescription and nutrition guidelines.;Long Term: Cholesterol controlled with medications as prescribed, with individualized exercise RX and with personalized nutrition plan. Value goals: LDL < 70mg , HDL > 40 mg.              Personal Goals Discharge:  Goals and Risk Factor Review     Row Name 01/25/22 1437 02/09/22 1443 03/05/22 0850 03/25/22 1359       Core Components/Risk Factors/Patient Goals Review   Personal Goals Review Weight  Management/Obesity;Heart Failure;Hypertension;Lipids;Diabetes Weight Management/Obesity;Heart Failure;Hypertension;Lipids;Diabetes Weight Management/Obesity;Heart Failure;Hypertension;Lipids;Diabetes Weight Management/Obesity;Heart Failure;Hypertension;Lipids;Diabetes    Review [name] started intensive cardiac rehab on  01/25/22. Sandy did well with exercise. Vital signs stable. CBG 209 pre exercise, post exercise CBg 166. Will contibnue to monitor CBG's. Sandy started intensive cardiac rehab on 01/25/22. [name] and has been doing well with exercise. Vital signs and CBG's have been stable. Sandy started intensive cardiac rehab on 01/25/22. [name] and has been doing well with exercise. [name] has had some intermitent exertional systolic BP's CBG's have been stable. Sent message to Dr Johney Frame for review. Sandy did well with exercise. [name] continued to have  some intermitent exertional systolic BP's CBG's have been stable. Sandy completed intensive cardiac rehab on 03/22/22    Expected Hostetter will continue to participate in intensive cardiac rehab for exercise, nutrition and lifestyle modificications. [name] will continue to participate in intensive cardiac rehab for exercise, nutrition and lifestyle modificications. [name] will continue to participate in intensive cardiac rehab for exercise, nutrition and lifestyle modificications. [name] will continue to  exercise, nutrition and lifestyle modificications.             Exercise Goals and Review:  Exercise Goals     Row Name 01/22/22 1225             Exercise Goals   Increase Physical Activity Yes       Intervention Provide advice, education, support and counseling about physical activity/exercise needs.;Develop an individualized exercise prescription for aerobic and resistive training based on initial evaluation findings, risk stratification, comorbidities and participant's personal goals.       Expected Outcomes Short Term: Attend rehab on a regular basis to increase amount of physical activity.;Long Term: Exercising regularly at least 3-5 days a week.;Long Term: Add in home exercise to make exercise part of routine and to increase amount of physical activity.       Increase Strength and Stamina Yes       Intervention Provide advice, education, support and  counseling about physical activity/exercise needs.;Develop an individualized exercise prescription for aerobic and resistive training based on initial evaluation findings, risk stratification, comorbidities and participant's personal goals.       Expected Outcomes Short Term: Increase workloads from initial exercise prescription for resistance, speed, and METs.;Short Term: Perform resistance training exercises routinely during rehab and add in resistance training at home;Long Term: Improve cardiorespiratory fitness, muscular endurance and strength as measured by increased METs and functional capacity (6MWT)       Able to understand and use rate of perceived exertion (RPE) scale Yes       Intervention Provide education and explanation on how to use RPE scale       Expected Outcomes Short Term: Able to use RPE daily in rehab to express subjective intensity level;Long Term:  Able to use RPE to guide intensity level when exercising independently       Knowledge and understanding of Target Heart Rate Range (THRR) Yes       Intervention Provide education and explanation of THRR including how the numbers were predicted and where they are located for reference       Expected Outcomes Short Term: Able to state/look up THRR;Long Term: Able to use THRR to govern intensity when exercising independently;Short Term: Able to use daily as guideline for intensity in rehab       Understanding of Exercise Prescription Yes       Intervention Provide education, explanation, and written materials on patient's individual exercise  prescription       Expected Outcomes Short Term: Able to explain program exercise prescription;Long Term: Able to explain home exercise prescription to exercise independently                Exercise Goals Re-Evaluation:  Exercise Goals Re-Evaluation     Row Name 01/25/22 1129 02/08/22 1118 02/15/22 1054 02/26/22 1142 03/23/22 0829     Exercise Goal Re-Evaluation   Exercise Goals Review  Increase Physical Activity;Able to understand and use rate of perceived exertion (RPE) scale;Increase Strength and Stamina Increase Physical Activity;Able to understand and use rate of perceived exertion (RPE) scale;Increase Strength and Stamina Increase Physical Activity;Able to understand and use rate of perceived exertion (RPE) scale;Increase Strength and Stamina;Knowledge and understanding of Target Heart Rate Range (THRR);Understanding of Exercise Prescription Increase Physical Activity;Able to understand and use rate of perceived exertion (RPE) scale;Increase Strength and Stamina;Knowledge and understanding of Target Heart Rate Range (THRR);Understanding of Exercise Prescription Increase Physical Activity;Able to understand and use rate of perceived exertion (RPE) scale;Increase Strength and Stamina;Knowledge and understanding of Target Heart Rate Range (THRR);Understanding of Exercise Prescription   Comments Patient able to understand and use RPE scale appropriately. Patient is not currently exercising at home. She can walk outside weather permitting. Patient's goal is to get in better shape. Wants to increase workloads on both machines. Reviewed exercise prescription with patient. Patient will walk 10-15 minutes 1-2 x's/day, 1 day a week increasing frequency and duration as tolerated. Patient states she's had huge improvement in her flexibility, which is one of her goals. She also states she has more energy, she's lost weight, her blood sugar is better, and overall she feels a lot beter. Patient completed the cardiac rehab program and progressed well. Her functional capacity increased 25% as measured by 6MWT. Patient feels better and feels her blood sugar is better controlled. [facility]   Expected Outcomes Progress workloads as tolerated to help increase strength and stamina and achieve personal health and fitness goals. Increase workloads on Nustep and Arm  ergometer. Add walking at home to achieve 150 minutes aerobic exercise/week. Patient will add a day of walking at home to her exercise routine. Patient will progress to 150 minutes of aerobic exercise /week. Continue to progress workloads to help achieve personal health and fitness goals. Patient will exericse at least 30 minutes[facility]            Nutrition & Weight - Outcomes:  Pre Biometrics - 01/22/22 1217       Pre Biometrics   Waist Circumference 41.25 inches    Hip Circumference 46.25 inches    Waist to Hip Ratio 0.89 %    Triceps Skinfold 44 mm    % Body Fat 46.3 %    Grip Strength 18 kg    Flexibility 10.5 in    Single Leg Stand 14.31 seconds             Post Biometrics - 03/22/22 1034        Post  Biometrics   Height 5\' 2"  (1.575 m)    Waist Circumference 42.25 inches    Hip Circumference 45.75 inches    Waist to Hip Ratio 0.92 %    BMI (Calculated) 30.15    Triceps Skinfold 43 mm    % Body Fat 46.2 %    Grip Strength 18 kg    Flexibility 0 in  Bilateral knee replacement, 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   Single Leg Stand 29.43 seconds             Nutrition:  Nutrition Therapy & Goals - 03/22/22 1128       Nutrition Therapy   Diet Heart Healthy/Carbohydrate Consistent    Drug/Food Interactions Statins/Certain Fruits      Personal Nutrition Goals   Nutrition Goal Patient to identify strategies for improving cardiovascualr risk by attending the [employer] education and nutrition series weekly    Personal Goal #2 Patient to improve diet quality by using the plate method as a daily guide for meal planning to include lean protein/plant protein, fruits, vegetables, whole grains, and nonfat dairy as part of a balanced diet    Personal Goal #3 Patient to identify strategies for managing blood sugar and A1c <7%    Personal Goal #4 Patient to reduce sodium intake to 1500mg  per day    Comments Goals in action.  [name] continues to attend the [employer] and nutrition series regularly. She continues to work on reading food labels for sodium and sugar, reducing saturated fat, and increasing fiber. She reports slowly implementing dietary changes with her family. Stressed the importance of reducing refined carbohydrates/simple sugars and alcohol to aid with blood sugar control and triglycerids. [name] will continue to benefit from adherance to the [employer] eating plan and regular exercise.      Intervention Plan   Intervention Prescribe, educate and counsel regarding individualized specific dietary modifications aiming towards targeted core components such as weight, hypertension, lipid management, diabetes, heart failure and other comorbidities.;Nutrition handout(s) given to patient.    Expected Outcomes Short Term Goal: Understand basic principles of dietary content, such as calories, fat, sodium, cholesterol and nutrients.;Long Term Goal: Adherence to prescribed nutrition plan.             Nutrition Discharge:  Nutrition Assessments - 03/09/22 0815       Rate Your Plate Scores   Post Score 70             Education Questionnaire Score:  Knowledge Questionnaire Score - 03/08/22 1714       Knowledge Questionnaire Score   Pre Score 23/24    Post Score 23/24             Goals reviewed with patient; copy given to patient.Pt graduates from  Intensive cardiac rehab program 03/22/22 with completion of  24 exercise and  24 education sessions. Pt maintained good attendance and progressed nicely during their participation in rehab as evidenced by increased MET level.   Medication list reconciled. Repeat  PHQ score-  0.  Pt has made significant lifestyle changes and should be commended for their success. Sandy increased her distance on her post exercise walk test by 269 feet. We are proud of Sand's progress! Sandy achieved their goals during cardiac rehab.   Pt plans to continue exercise at  the [facility]. [name] RN BSN

## 2022-05-04 ENCOUNTER — Encounter: Payer: Self-pay | Admitting: Family Medicine

## 2022-06-25 NOTE — Progress Notes (Unsigned)
Cardiology Office Note:    Date:  06/29/2022   ID:  Shannon Moore, DOB [birth year], MRN [id number]  PCP:  Shannon Montana, MD   [facility] Providers Cardiologist:  Meriam Sprague, MD     Referring MD: Shannon Montana, MD    History of Present Illness:    Shannon Moore is a 77 y.o. female with a hx of CAD, HTN, HLD, DMII, and HFpEF who returns to clinic for follow-up.  Patient was initially referred in 05/2021 for dyspnea on exertion. TTE 05/18/21 showed EF 55-60%, no WMA, G1DD, trivial MR, moderate MAC. Coronary CTA showed severe stenosis in proximal RCA which was positive by FFR. She was referred for cath in 06/12/21 which showed 80% proximal-RCA stenosis. Underwent PCI/DES placement with a Synergy XD 3.0 mm x 20 mm. Was started on DAPT with ASA and Brilitna.    Seen in follow-up by Tereso Newcomer on 06/2021 where she was stable from a CV standpoint. Had mild LE edema and was started on lasix every other day and HCTZ was discontinued.  Was last seen in clinic on 12/2021 where she was doing well from a CV standpoint.  Today, the patient overall feels well. No chest pain, SOB, LE edema, orthopnea or PND. Very rarely uses lasix. Blood pressure is well controlled. Compliant with medications. Loved cardiac rehab and has since gone to the [facility] and she feels well with activity.    Past Medical History:  Diagnosis Date   Abnormal mammogram    Adenomatous polyp of colon    Anginal pain (HCC)    Arthritis    Hands, Knees hips, feet,    Bell's palsy    CAD (coronary artery disease)    a. s/p DES to prox-RCA in 06/2021 following abnormal Coronary CT   CHF (congestive heart failure) (HCC) 1990s   pt feels it is from Zoloft or a virus   Diabetic peripheral neuropathy (HCC)    Diastolic dysfunction    Diverticulitis    DM (diabetes mellitus) (HCC)    Dysphagia    Esophageal obstruction    Essential hypertension 01/08/2014   Female cystocele    GERD (gastroesophageal reflux disease)     Goiter    Gout    History of 2019 novel coronavirus disease (COVID-19)    HTN (hypertension)    Hyperlipidemia 01/08/2014   Migraines    Mixed incontinence urge and stress    Obesity (BMI 30-39.9) 01/08/2014   Osteoarthritis of knee    Thyroid nodule    Type 2 diabetes mellitus without complication (HCC) 01/08/2014    Past Surgical History:  Procedure Laterality Date   CARDIAC SURGERY     CORONARY STENT INTERVENTION N/A 06/12/2021   Procedure: CORONARY STENT INTERVENTION;  Surgeon: Marykay Lex, MD;  Location: [facility] INVASIVE CV LAB;  Service: Cardiovascular;  Laterality: N/A;   DIAGNOSTIC LAPAROSCOPY     EYE SURGERY     bilateral  RK 10 yrs ago   LAPAROTOMY N/A 08/12/2019   Procedure: EXPLORATORY LAPAROTOMY WITH MOBILIZATION OF ASCENDING AND DESCENDING COLON, MOBILIZATION OF DUODENUM, ABDOMINAL WASH OUT AND DRAIN PLACEMENT.;  Surgeon: Diamantina Monks, MD;  Location: [facility] OR;  Service: General;  Laterality: N/A;   left and right knee arthroscopies- left 6 months ago     LEFT HEART CATH AND CORONARY ANGIOGRAPHY N/A 06/12/2021   Procedure: LEFT HEART CATH AND CORONARY ANGIOGRAPHY;  Surgeon: Marykay Lex, MD;  Location: [facility] INVASIVE CV LAB;  Service: Cardiovascular;  Laterality:  N/A;   LEFT HEART CATHETERIZATION WITH CORONARY ANGIOGRAM N/A 01/10/2014   Procedure: LEFT HEART CATHETERIZATION WITH CORONARY ANGIOGRAM;  Surgeon: Othella Boyer, MD;  Location: [facility] CATH LAB;  Service: Cardiovascular;  Laterality: N/A;   removal of bladder tumor     TOTAL KNEE ARTHROPLASTY  11/16/2010   Procedure: TOTAL KNEE ARTHROPLASTY;  Surgeon: Shelda Pal;  Location: [facility] ORS;  Service: Orthopedics;  Laterality: Left;   TOTAL KNEE ARTHROPLASTY Right 08/07/2019   Procedure: TOTAL KNEE ARTHROPLASTY;  Surgeon: Durene Romans, MD;  Location: [facility] ORS;  Service: Orthopedics;  Laterality: Right;  70 mins   TUBAL LIGATION      Current Medications: Current Meds  Medication Sig   allopurinol (ZYLOPRIM) 300 MG  tablet Take 300 mg by mouth daily with supper.   b complex vitamins capsule Take 1 capsule by mouth daily.   Biotin 19147 MCG TABS Take 10,000 mcg by mouth daily.   cetirizine (ZYRTEC) 10 MG tablet Take 10 mg by mouth daily.   Cholecalciferol (VITAMIN D3) 50 MCG (2000 UT) TABS Take 2,000 Units by mouth daily with supper.   clopidogrel (PLAVIX) 75 MG tablet Take 1 tablet (75 mg total) by mouth daily.   fluticasone (FLONASE) 50 MCG/ACT nasal spray Place 2 sprays into both nostrils daily as needed for allergies or rhinitis.   Glucosamine-Chondroitin 500-400 MG CAPS Take 1 capsule by mouth in the morning and at bedtime.   magnesium gluconate (MAGONATE) 500 MG tablet Take 500 mg by mouth daily with supper.   metFORMIN (GLUCOPHAGE-XR) 500 MG 24 hr tablet Take 500 mg by mouth 2 (two) times daily with a meal.   metoprolol succinate (TOPROL-XL) 25 MG 24 hr tablet Take 25 mg by mouth daily with supper.   Multiple Vitamins-Minerals (MULTIVITAMIN WITH MINERALS) tablet Take 1 tablet by mouth daily.   olmesartan (BENICAR) 20 MG tablet Take 0.5 tablets (10 mg total) by mouth daily.   pantoprazole (PROTONIX) 40 MG tablet Take 40 mg by mouth as needed (for indigestion).   Probiotic Product (PROBIOTIC PO) Take 1 tablet by mouth daily.   rosuvastatin (CRESTOR) 20 MG tablet Take 1 tablet (20 mg total) by mouth daily.   TACROLIMUS EX Apply 1 application. topically daily as needed (Psoriasis). Ointment   tiZANidine (ZANAFLEX) 2 MG tablet Take 2 mg by mouth 3 (three) times daily as needed for muscle spasms.   [DISCONTINUED] aspirin EC 81 MG tablet 81 mg daily.   [DISCONTINUED] ticagrelor (BRILINTA) 90 MG TABS tablet Take 1 tablet (90 mg total) by mouth 2 (two) times daily.     Allergies:   Atorvastatin, Diovan [valsartan], Iodinated contrast media, Lotensin [benazepril], Sertraline, Sulfa antibiotics, Sulfasalazine, and Penicillins   Social History   Socioeconomic History   Marital status: Married    Spouse  name: Not on file   Number of children: Not on file   Years of education: Not on file   Highest education level: Not on file  Occupational History   Not on file  Tobacco Use   Smoking status: Former    Packs/day: 1.00    Years: 8.00    Additional pack years: 0.00    Total pack years: 8.00    Types: Cigarettes    Quit date: 11/10/1977    Years since quitting: 44.6   Smokeless tobacco: Never  Vaping Use   Vaping Use: Never used  Substance and Sexual Activity   Alcohol use: Yes    Alcohol/week: 1.0 standard drink of alcohol  Types: 1 Glasses of wine per week   Drug use: No   Sexual activity: Not on file  Other Topics Concern   Not on file  Social History Narrative   Not on file   Social Determinants of Health   Financial Resource Strain: Not on file  Food Insecurity: Not on file  Transportation Needs: Not on file  Physical Activity: Not on file  Stress: Not on file  Social Connections: Not on file     Family History: The patient's Family history is unknown by patient.  ROS:   As per HPI   EKGs/Labs/Other Studies Reviewed:    The following studies were reviewed today:  LEFT HEART CATH 06/12/2021   Culprit lesion: Prox RCA lesion is 80% stenosed.   A drug-eluting stent was successfully placed using a SYNERGY XD 3.0X20.          CCTA 06/02/21 Coronary calcium score:  287,  75th percentile. Right Coronary Artery:  proximal 70-99%  Left Main Coronary Artery: Ostial 1-10%  Left Anterior Descending Coronary Artery:  proximal LAD with 25-49%   Left Circumflex Artery: proximal LCx 25-49%   FFR 1. Left Main:  No significant stenosis. FFR = 0.99 2. LAD: No significant stenosis. Proximal FFR = 0.98, Mid FFR = 0.95, Distal FFR = 0.89 3. LCX: No significant stenosis. Proximal FFR = 0.94, Distal FFR = 0.93 4. RCA: No significant stenosis. Ostial FFR = 0.99, FFR after proximal stenosis = 0.67 IMPRESSION: 1.  CT FFR analysis shows significant stenosis in the proximal  RCA.   Echocardiogram 05/18/21 EF 55-60, no RWMA, GR 1 DD, normal RVSF, trivial MR, moderate MAC, mild AV calcification  EKG:  No new tracing   Recent Labs: 03/02/2022: ALT 16; BUN 17; Creatinine, Ser 0.83; Hemoglobin 12.3; Platelets 320; Potassium 3.8; Sodium 142   Recent Lipid Panel    Component Value Date/Time   CHOL 113 03/02/2022 0838   TRIG 196 (H) 03/02/2022 0838   HDL 50 03/02/2022 0838   CHOLHDL 2.3 03/02/2022 0838   LDLCALC 32 03/02/2022 0838     Risk Assessment/Calculations:           Physical Exam:    VS:  BP 124/72   Pulse 66   Ht 5\' 2"  (1.575 m)   Wt 162 lb (73.5 kg)   SpO2 98%   BMI 29.63 kg/m     Wt Readings from Last 3 Encounters:  06/29/22 162 lb (73.5 kg)  03/22/22 164 lb 14.5 oz (74.8 kg)  01/22/22 168 lb 6.9 oz (76.4 kg)     GEN: Well nourished, well developed in no acute distress HEENT: Normal NECK: No JVD; No carotid bruits CARDIAC: RRR, no murmurs, rubs, gallops RESPIRATORY:  Clear to auscultation without rales, wheezing or rhonchi  ABDOMEN: Soft, non-tender, non-distended MUSCULOSKELETAL:  No edema; No deformity  SKIN: Warm and dry NEUROLOGIC:  Alert and oriented x 3 PSYCHIATRIC:  Normal affect   ASSESSMENT:    1. Coronary artery disease involving native coronary artery of native heart without angina pectoris   2. Palpitations   3. Hyperlipidemia LDL goal <70   4. Essential hypertension   5. Diabetes mellitus with coincident hypertension (HCC)   6. Chronic heart failure with preserved ejection fraction (HCC)     PLAN:    In order of problems listed above:  #CAD s/p PCI to RCA: Patient with recent PCI to [facility] in 06/2021. Currently doing well without anginal symptoms. -Stop ASA and change to plavix 75mg  daily -  Continue crestor 20mg  daily -Continue metop 25mg  XL daily  #Chronic Diastolic HF: TTE 82/9562 with LVEF 55-60% with G1DD. Currently euvolemic and compensated. -Continue metoprolol 25mg  daily -Continue olmesartan  10mg  daily -Continue lasix 20mg  as needed -Low Na diet  #Palpitations: Improved with very rare episodes. -Continue metop 25mg  XL daily -If becomes more frequent, can obtain cardiac monitor at that time  #HTN: Well controlled and at goal at home <130/90s. States she has white coat HTN and always elevated in the MD office. She will continue to monitor at home as well.  -Continue olmesartan 10mg  daily -Continue metoprolol 25mg  daily  #HLD: -Continue crestor 20mg  daily -LDL controlled at 32  #DMII: -Continue metformin      Follow-up:  6 months.  Medication Adjustments/Labs and Tests Ordered: Current medicines are reviewed at length with the patient today.  Concerns regarding medicines are outlined above.   Orders Placed This Encounter  Procedures   EKG 12-Lead   EKG 12-Lead   Meds ordered this encounter  Medications   clopidogrel (PLAVIX) 75 MG tablet    Sig: Take 1 tablet (75 mg total) by mouth daily.    Dispense:  90 tablet    Refill:  4   Patient Instructions  Medication Instructions:   STOP TAKING BRILINTA NOW  STOP TAKING ASPIRIN NOW  START TAKING PLAVIX 75 MG BY MOUTH DAILY  *If you need a refill on your cardiac medications before your next appointment, please call your pharmacy*    Follow-Up: At [facility], you and your health needs are our priority.  As part of our continuing mission to provide you with exceptional heart care, we have created designated Provider Care Teams.  These Care Teams include your primary Cardiologist (physician) and Advanced Practice Providers (APPs -  Physician Assistants and Nurse Practitioners) who all work together to provide you with the care you need, when you need it.  We recommend signing up for the patient portal called "MyChart".  Sign up information is provided on this After Visit Summary.  MyChart is used to connect with patients for Virtual Visits (Telemedicine).  Patients are able to view lab/test results,  encounter notes, upcoming appointments, etc.  Non-urgent messages can be sent to your provider as well.   To learn more about what you can do with MyChart, go to [email or web address].    Your next appointment:   6 month(s)  Provider:   DR. Verne Carrow     Signed, Meriam Sprague, MD  06/29/2022 11:07 AM    [facility]

## 2022-06-29 ENCOUNTER — Encounter: Payer: Self-pay | Admitting: Cardiology

## 2022-06-29 ENCOUNTER — Ambulatory Visit: Payer: Medicare Other | Attending: Cardiology | Admitting: Cardiology

## 2022-06-29 VITALS — BP 124/72 | HR 66 | Ht 62.0 in | Wt 162.0 lb

## 2022-06-29 DIAGNOSIS — R002 Palpitations: Secondary | ICD-10-CM

## 2022-06-29 DIAGNOSIS — I10 Essential (primary) hypertension: Secondary | ICD-10-CM | POA: Diagnosis not present

## 2022-06-29 DIAGNOSIS — I5032 Chronic diastolic (congestive) heart failure: Secondary | ICD-10-CM

## 2022-06-29 DIAGNOSIS — E785 Hyperlipidemia, unspecified: Secondary | ICD-10-CM | POA: Diagnosis not present

## 2022-06-29 DIAGNOSIS — E119 Type 2 diabetes mellitus without complications: Secondary | ICD-10-CM

## 2022-06-29 DIAGNOSIS — I2510 Atherosclerotic heart disease of native coronary artery without angina pectoris: Secondary | ICD-10-CM | POA: Diagnosis not present

## 2022-06-29 DIAGNOSIS — Z7984 Long term (current) use of oral hypoglycemic drugs: Secondary | ICD-10-CM

## 2022-06-29 MED ORDER — CLOPIDOGREL BISULFATE 75 MG PO TABS: 75.0000 mg | ORAL_TABLET | Freq: Every day | ORAL | 4 refills | Status: DC

## 2022-06-29 NOTE — Patient Instructions (Signed)
Medication Instructions:   STOP TAKING BRILINTA NOW  STOP TAKING ASPIRIN NOW  START TAKING PLAVIX 75 MG BY MOUTH DAILY  *If you need a refill on your cardiac medications before your next appointment, please call your pharmacy*    Follow-Up: At [facility], you and your health needs are our priority.  As part of our continuing mission to provide you with exceptional heart care, we have created designated Provider Care Teams.  These Care Teams include your primary Cardiologist (physician) and Advanced Practice Providers (APPs -  Physician Assistants and Nurse Practitioners) who all work together to provide you with the care you need, when you need it.  We recommend signing up for the patient portal called "MyChart".  Sign up information is provided on this After Visit Summary.  MyChart is used to connect with patients for Virtual Visits (Telemedicine).  Patients are able to view lab/test results, encounter notes, upcoming appointments, etc.  Non-urgent messages can be sent to your provider as well.   To learn more about what you can do with MyChart, go to [email or web address].    Your next appointment:   6 month(s)  Provider:   DR. Verne Carrow

## 2022-08-12 ENCOUNTER — Other Ambulatory Visit: Payer: Self-pay | Admitting: Physician Assistant

## 2023-03-01 ENCOUNTER — Encounter: Payer: Self-pay | Admitting: Cardiovascular Disease

## 2023-03-01 ENCOUNTER — Ambulatory Visit: Payer: Medicare Other | Attending: Cardiovascular Disease | Admitting: Cardiovascular Disease

## 2023-03-01 VITALS — BP 176/84 | HR 81 | Ht 62.0 in | Wt 163.2 lb

## 2023-03-01 DIAGNOSIS — E785 Hyperlipidemia, unspecified: Secondary | ICD-10-CM | POA: Diagnosis not present

## 2023-03-01 DIAGNOSIS — I2510 Atherosclerotic heart disease of native coronary artery without angina pectoris: Secondary | ICD-10-CM | POA: Diagnosis not present

## 2023-03-01 DIAGNOSIS — I10 Essential (primary) hypertension: Secondary | ICD-10-CM

## 2023-03-01 MED ORDER — NITROGLYCERIN 0.4 MG SL SUBL: 0.4000 mg | SUBLINGUAL_TABLET | SUBLINGUAL | 3 refills | Status: AC | PRN

## 2023-03-01 NOTE — Progress Notes (Signed)
Chief Complaint  Patient presents with   Follow-up    CAD   History of Present Illness: 78 yo female with history of CAD, HTN, HLD, DM and HFpEF who is here today for cardiac follow up. She has been followed in our office by Dr. Shari Prows. She was seen in May 2023 for evaluation of dyspnea. Echo May 2023 with LVEF=55-60%. Coronary CTA May 2023 with severe proximal RCA stenosis. Cardiac cath June 2023 with severe RCA stenosis treated with a drug eluting stent.   She is here today for follow up. The patient denies any chest pain, dyspnea, palpitations, lower extremity edema, orthopnea, PND, dizziness, near syncope or syncope.   Primary Care Physician: Laurann Montana, MD   Past Medical History:  Diagnosis Date   Abnormal mammogram    Adenomatous polyp of colon    Anginal pain (HCC)    Arthritis    Hands, Knees hips, feet,    Bell's palsy    CAD (coronary artery disease)    a. s/p DES to prox-RCA in 06/2021 following abnormal Coronary CT   CHF (congestive heart failure) (HCC) 1990s   pt feels it is from Zoloft or a virus   Diabetic peripheral neuropathy (HCC)    Diastolic dysfunction    Diverticulitis    DM (diabetes mellitus) (HCC)    Dysphagia    Esophageal obstruction    Essential hypertension 01/08/2014   Female cystocele    GERD (gastroesophageal reflux disease)    Goiter    Gout    History of 2019 novel coronavirus disease (COVID-19)    HTN (hypertension)    Hyperlipidemia 01/08/2014   Migraines    Mixed incontinence urge and stress    Obesity (BMI 30-39.9) 01/08/2014   Osteoarthritis of knee    Thyroid nodule    Type 2 diabetes mellitus without complication (HCC) 01/08/2014    Past Surgical History:  Procedure Laterality Date   CORONARY STENT INTERVENTION N/A 06/12/2021   Procedure: CORONARY STENT INTERVENTION;  Surgeon: Marykay Lex, MD;  Location: [facility] INVASIVE CV LAB;  Service: Cardiovascular;  Laterality: N/A;   DIAGNOSTIC LAPAROSCOPY     EYE SURGERY      bilateral  RK 10 yrs ago   LAPAROTOMY N/A 08/12/2019   Procedure: EXPLORATORY LAPAROTOMY WITH MOBILIZATION OF ASCENDING AND DESCENDING COLON, MOBILIZATION OF DUODENUM, ABDOMINAL WASH OUT AND DRAIN PLACEMENT.;  Surgeon: Diamantina Monks, MD;  Location: [facility] OR;  Service: General;  Laterality: N/A;   left and right knee arthroscopies- left 6 months ago     LEFT HEART CATH AND CORONARY ANGIOGRAPHY N/A 06/12/2021   Procedure: LEFT HEART CATH AND CORONARY ANGIOGRAPHY;  Surgeon: Marykay Lex, MD;  Location: [facility] INVASIVE CV LAB;  Service: Cardiovascular;  Laterality: N/A;   LEFT HEART CATHETERIZATION WITH CORONARY ANGIOGRAM N/A 01/10/2014   Procedure: LEFT HEART CATHETERIZATION WITH CORONARY ANGIOGRAM;  Surgeon: Othella Boyer, MD;  Location: [facility] CATH LAB;  Service: Cardiovascular;  Laterality: N/A;   removal of bladder tumor     TOTAL KNEE ARTHROPLASTY  11/16/2010   Procedure: TOTAL KNEE ARTHROPLASTY;  Surgeon: Shelda Pal;  Location: [facility] ORS;  Service: Orthopedics;  Laterality: Left;   TOTAL KNEE ARTHROPLASTY Right 08/07/2019   Procedure: TOTAL KNEE ARTHROPLASTY;  Surgeon: Durene Romans, MD;  Location: [facility] ORS;  Service: Orthopedics;  Laterality: Right;  70 mins   TUBAL LIGATION      Current Outpatient Medications  Medication Sig Dispense Refill   allopurinol (ZYLOPRIM) 300 MG  tablet Take 300 mg by mouth daily with supper.     b complex vitamins capsule Take 1 capsule by mouth daily.     Biotin 81191 MCG TABS Take 10,000 mcg by mouth daily. 30 tablet    cetirizine (ZYRTEC) 10 MG tablet Take 10 mg by mouth daily.     Cholecalciferol (VITAMIN D3) 50 MCG (2000 UT) TABS Take 2,000 Units by mouth daily with supper.     clopidogrel (PLAVIX) 75 MG tablet Take 1 tablet (75 mg total) by mouth daily. 90 tablet 4   dapagliflozin propanediol (FARXIGA) 10 MG TABS tablet Take 10 mg by mouth daily.     fluticasone (FLONASE) 50 MCG/ACT nasal spray Place 2 sprays into both nostrils daily as needed for  allergies or rhinitis.     Glucosamine-Chondroitin 500-400 MG CAPS Take 1 capsule by mouth in the morning and at bedtime.     ipratropium (ATROVENT) 0.06 % nasal spray Place 2 sprays into both nostrils as needed for rhinitis.     magnesium gluconate (MAGONATE) 500 MG tablet Take 500 mg by mouth daily with supper.     metoprolol succinate (TOPROL-XL) 25 MG 24 hr tablet Take 25 mg by mouth daily with supper.     Multiple Vitamins-Minerals (MULTIVITAMIN WITH MINERALS) tablet Take 1 tablet by mouth daily.     olmesartan (BENICAR) 20 MG tablet Take 0.5 tablets (10 mg total) by mouth daily. 45 tablet 3   pantoprazole (PROTONIX) 40 MG tablet Take 40 mg by mouth as needed (for indigestion).     Probiotic Product (PROBIOTIC PO) Take 1 tablet by mouth daily.     rosuvastatin (CRESTOR) 20 MG tablet Take 1 tablet (20 mg total) by mouth daily. 90 tablet 3   TACROLIMUS EX Apply 1 application. topically daily as needed (Psoriasis). Ointment     tiZANidine (ZANAFLEX) 2 MG tablet Take 2 mg by mouth 3 (three) times daily as needed for muscle spasms.     nitroGLYCERIN (NITROSTAT) 0.4 MG SL tablet Place 1 tablet (0.4 mg total) under the tongue every 5 (five) minutes as needed for chest pain. 25 tablet 3   No current facility-administered medications for this visit.    Allergies  Allergen Reactions   Atorvastatin Other (See Comments)    Intolerance    Diovan [Valsartan] Other (See Comments)    DRY MOUTH    Iodinated Contrast Media Other (See Comments)    DIARRHEA/ liquid only  Iv ok    Lotensin [Benazepril]     COUGH   Sertraline Other (See Comments)    Congestive heart failure.   Sulfa Antibiotics Itching   Sulfasalazine Itching   Penicillins Rash    Tolerated Cephalosporin Date: 08/07/19.  Has patient had a PCN reaction causing immediate rash, facial/tongue/throat swelling, SOB or lightheadedness with hypotension: Yes Has patient had a PCN reaction causing severe rash involving mucus membranes or  skin necrosis: No Has patient had a PCN reaction that required hospitalization: No Has patient had a PCN reaction occurring within the last 10 years: No If all of the above answers are "NO", then may proceed with Cephalosporin use.     Social History   Socioeconomic History   Marital status: Married    Spouse name: Not on file   Number of children: Not on file   Years of education: Not on file   Highest education level: Not on file  Occupational History   Not on file  Tobacco Use   Smoking status: Former  Current packs/day: 0.00    Average packs/day: 1 pack/day for 8.0 years (8.0 ttl pk-yrs)    Types: Cigarettes    Start date: 11/10/1969    Quit date: 11/10/1977    Years since quitting: 45.3   Smokeless tobacco: Never  Vaping Use   Vaping status: Never Used  Substance and Sexual Activity   Alcohol use: Yes    Alcohol/week: 1.0 standard drink of alcohol    Types: 1 Glasses of wine per week   Drug use: No   Sexual activity: Not on file  Other Topics Concern   Not on file  Social History Narrative   Not on file   Social Drivers of Health   Financial Resource Strain: Not on file  Food Insecurity: Not on file  Transportation Needs: Not on file  Physical Activity: Not on file  Stress: Not on file  Social Connections: Not on file  Intimate Partner Violence: Not on file    Family History  Family history unknown: Yes    Review of Systems:  As stated in the HPI and otherwise negative.   BP (!) 176/84   Pulse 81   Ht 5\' 2"  (1.575 m)   Wt 74 kg   SpO2 95%   BMI 29.85 kg/m   Physical Examination: General: Well developed, well nourished, NAD  HEENT: OP clear, mucus membranes moist  SKIN: warm, dry. No rashes. Neuro: No focal deficits  Musculoskeletal: Muscle strength 5/5 all ext  Psychiatric: Mood and affect normal  Neck: No JVD, no carotid bruits, no thyromegaly, no lymphadenopathy.  Lungs:Clear bilaterally, no wheezes, rhonci, crackles Cardiovascular:  Regular rate and rhythm. No murmurs, gallops or rubs. Abdomen:Soft. Bowel sounds present. Non-tender.  Extremities: No lower extremity edema. Pulses are 2 + in the bilateral DP/PT.  EKG:  EKG is not ordered today. The ekg ordered today demonstrates   Recent Labs: 03/02/2022: ALT 16; BUN 17; Creatinine, Ser 0.83; Hemoglobin 12.3; Platelets 320; Potassium 3.8; Sodium 142   Lipid Panel    Component Value Date/Time   CHOL 113 03/02/2022 0838   TRIG 196 (H) 03/02/2022 0838   HDL 50 03/02/2022 0838   CHOLHDL 2.3 03/02/2022 0838   LDLCALC 32 03/02/2022 0838     Wt Readings from Last 3 Encounters:  03/01/23 74 kg  06/29/22 73.5 kg  03/22/22 74.8 kg    Assessment and Plan:   1. CAD without angina: No chest pain suggestive of angina. Will continue Plavix, Toprol and Crestor  2. HTN: BP is well controlled at home. She says that she has white coat HTN. Continue Toprol and Benicar  3. Hyperlipidemia: LDL 32 in February 2024. Will continue Crestor. Repeat lipids and LFTs now.   Labs/ tests ordered today include:  No orders of the defined types were placed in this encounter.  Disposition:   F/U with me in 12 months  Signed, Verne Carrow, MD, [facility] 03/01/2023 2:46 PM    [facility] [address] [place], [place], [place]  [address] Phone: [phone]; Fax: [phone]

## 2023-03-01 NOTE — Patient Instructions (Addendum)
Medication Instructions:  No changes *If you need a refill on your cardiac medications before your next appointment, please call your pharmacy*   Lab Work: none   Testing/Procedures: none   Follow-Up: At [facility], you and your health needs are our priority.  As part of our continuing mission to provide you with exceptional heart care, we have created designated Provider Care Teams.  These Care Teams include your primary Cardiologist (physician) and Advanced Practice Providers (APPs -  Physician Assistants and Nurse Practitioners) who all work together to provide you with the care you need, when you need it.  Your next appointment:   12 month(s)  Provider:   Verne Carrow, MD  Other Instructions none

## 2023-05-16 ENCOUNTER — Encounter: Payer: Self-pay | Admitting: Family Medicine

## 2023-09-19 ENCOUNTER — Other Ambulatory Visit: Payer: Self-pay | Admitting: Cardiovascular Disease

## 2023-09-19 DIAGNOSIS — I2510 Atherosclerotic heart disease of native coronary artery without angina pectoris: Secondary | ICD-10-CM

## 2024-04-27 ENCOUNTER — Ambulatory Visit: Admitting: Cardiovascular Disease
